# Patient Record
Sex: Male | Born: 1935 | Race: White | Hispanic: No | Smoking: Former smoker
Health system: Southern US, Community
[De-identification: ages and names within clinical notes are randomized; demographics above are authoritative.]

## PROBLEM LIST (undated history)

## (undated) DIAGNOSIS — G629 Polyneuropathy, unspecified: Secondary | ICD-10-CM

## (undated) DIAGNOSIS — B029 Zoster without complications: Secondary | ICD-10-CM

## (undated) DIAGNOSIS — I10 Essential (primary) hypertension: Secondary | ICD-10-CM

## (undated) DIAGNOSIS — IMO0002 Reserved for concepts with insufficient information to code with codable children: Secondary | ICD-10-CM

## (undated) DIAGNOSIS — I219 Acute myocardial infarction, unspecified: Secondary | ICD-10-CM

## (undated) DIAGNOSIS — I519 Heart disease, unspecified: Secondary | ICD-10-CM

## (undated) DIAGNOSIS — I639 Cerebral infarction, unspecified: Secondary | ICD-10-CM

## (undated) HISTORY — DX: Reserved for concepts with insufficient information to code with codable children: IMO0002

## (undated) HISTORY — DX: Heart disease, unspecified: I51.9

## (undated) HISTORY — DX: Cerebral infarction, unspecified: I63.9

## (undated) HISTORY — PX: OTHER SURGICAL HISTORY: SHX169

---

## 1976-09-30 DIAGNOSIS — I219 Acute myocardial infarction, unspecified: Secondary | ICD-10-CM

## 1976-09-30 HISTORY — DX: Acute myocardial infarction, unspecified: I21.9

## 2003-12-02 ENCOUNTER — Other Ambulatory Visit: Payer: Self-pay

## 2004-06-30 ENCOUNTER — Encounter: Payer: Self-pay | Admitting: Internal Medicine

## 2004-07-31 ENCOUNTER — Encounter: Payer: Self-pay | Admitting: Internal Medicine

## 2004-08-30 ENCOUNTER — Encounter: Payer: Self-pay | Admitting: Internal Medicine

## 2004-09-30 ENCOUNTER — Encounter: Payer: Self-pay | Admitting: Internal Medicine

## 2004-10-31 ENCOUNTER — Encounter: Payer: Self-pay | Admitting: Internal Medicine

## 2004-11-28 ENCOUNTER — Encounter: Payer: Self-pay | Admitting: Internal Medicine

## 2004-12-29 ENCOUNTER — Encounter: Payer: Self-pay | Admitting: Internal Medicine

## 2005-01-28 ENCOUNTER — Encounter: Payer: Self-pay | Admitting: Internal Medicine

## 2005-02-28 ENCOUNTER — Encounter: Payer: Self-pay | Admitting: Internal Medicine

## 2005-03-30 ENCOUNTER — Encounter: Payer: Self-pay | Admitting: Internal Medicine

## 2005-04-30 ENCOUNTER — Encounter: Payer: Self-pay | Admitting: Internal Medicine

## 2005-05-31 ENCOUNTER — Encounter: Payer: Self-pay | Admitting: Internal Medicine

## 2005-06-30 ENCOUNTER — Encounter: Payer: Self-pay | Admitting: Internal Medicine

## 2005-07-31 ENCOUNTER — Encounter: Payer: Self-pay | Admitting: Internal Medicine

## 2005-08-30 ENCOUNTER — Encounter: Payer: Self-pay | Admitting: Internal Medicine

## 2005-09-30 ENCOUNTER — Encounter: Payer: Self-pay | Admitting: Internal Medicine

## 2006-07-28 ENCOUNTER — Ambulatory Visit: Payer: Self-pay | Admitting: Internal Medicine

## 2006-08-18 ENCOUNTER — Ambulatory Visit: Payer: Self-pay | Admitting: Pain Medicine

## 2006-08-19 ENCOUNTER — Ambulatory Visit: Payer: Self-pay | Admitting: Pain Medicine

## 2006-09-02 ENCOUNTER — Ambulatory Visit: Payer: Self-pay | Admitting: Physician Assistant

## 2008-04-19 ENCOUNTER — Ambulatory Visit: Payer: Self-pay | Admitting: Unknown Physician Specialty

## 2008-06-06 ENCOUNTER — Ambulatory Visit: Payer: Self-pay | Admitting: Internal Medicine

## 2008-07-08 ENCOUNTER — Other Ambulatory Visit: Payer: Self-pay

## 2008-07-08 ENCOUNTER — Inpatient Hospital Stay: Payer: Self-pay | Admitting: Internal Medicine

## 2008-09-20 ENCOUNTER — Ambulatory Visit: Payer: Self-pay | Admitting: Gastroenterology

## 2010-02-28 ENCOUNTER — Ambulatory Visit: Payer: Self-pay | Admitting: Ophthalmology

## 2010-03-13 ENCOUNTER — Ambulatory Visit: Payer: Self-pay | Admitting: Ophthalmology

## 2010-04-03 ENCOUNTER — Ambulatory Visit: Payer: Self-pay | Admitting: Ophthalmology

## 2010-04-11 ENCOUNTER — Ambulatory Visit: Payer: Self-pay | Admitting: Ophthalmology

## 2010-09-05 DIAGNOSIS — C4492 Squamous cell carcinoma of skin, unspecified: Secondary | ICD-10-CM

## 2010-09-05 HISTORY — DX: Squamous cell carcinoma of skin, unspecified: C44.92

## 2012-09-15 ENCOUNTER — Ambulatory Visit: Payer: Self-pay | Admitting: Pain Medicine

## 2012-10-14 ENCOUNTER — Ambulatory Visit: Payer: Self-pay | Admitting: Pain Medicine

## 2012-10-15 ENCOUNTER — Ambulatory Visit: Payer: Self-pay | Admitting: Pain Medicine

## 2012-11-02 ENCOUNTER — Ambulatory Visit: Payer: Self-pay | Admitting: Pain Medicine

## 2013-08-18 ENCOUNTER — Encounter: Payer: Self-pay | Admitting: Podiatry

## 2013-08-23 ENCOUNTER — Ambulatory Visit (INDEPENDENT_AMBULATORY_CARE_PROVIDER_SITE_OTHER): Payer: Medicare Other | Admitting: Podiatry

## 2013-08-23 ENCOUNTER — Encounter: Payer: Self-pay | Admitting: Podiatry

## 2013-08-23 VITALS — BP 107/78 | HR 92 | Resp 16 | Ht 68.0 in | Wt 155.0 lb

## 2013-08-23 DIAGNOSIS — B351 Tinea unguium: Secondary | ICD-10-CM

## 2013-08-23 DIAGNOSIS — M79609 Pain in unspecified limb: Secondary | ICD-10-CM

## 2013-08-23 NOTE — Progress Notes (Signed)
Boen presents today with a chief complaint of painful toenails one through 5 bilateral.  Objective: Pulses are palpable bilateral. Nails are thick yellow dystrophic onychomycotic and painful on palpation and debridement.  Assessment: Pain in limb secondary to onychomycosis.  Plan: Debridement of nails 1 through 5 bilateral is cover service secondary to pain followup with him in 3 months.

## 2013-11-22 ENCOUNTER — Ambulatory Visit (INDEPENDENT_AMBULATORY_CARE_PROVIDER_SITE_OTHER): Payer: Medicare Other | Admitting: Podiatry

## 2013-11-22 ENCOUNTER — Ambulatory Visit: Payer: Medicare Other | Admitting: Podiatry

## 2013-11-22 ENCOUNTER — Encounter: Payer: Self-pay | Admitting: Podiatry

## 2013-11-22 VITALS — BP 125/68 | HR 61 | Resp 18

## 2013-11-22 DIAGNOSIS — B351 Tinea unguium: Secondary | ICD-10-CM

## 2013-11-22 DIAGNOSIS — M79609 Pain in unspecified limb: Secondary | ICD-10-CM

## 2013-11-22 NOTE — Progress Notes (Signed)
Trim toenails because they're painful when I walk.  Objective: Vital signs are stable alert and oriented x3. Nails are thick yellow dystrophic with mycotic and painful palpation. Pulses are palpable bilateral.  Assessment: Pain in limb secondary to onychomycosis 1 through 5 bilateral.  Plan: Debridement of nails in thickness and length as cover service secondary to pain followup with him in 3 months.

## 2014-02-14 ENCOUNTER — Ambulatory Visit (INDEPENDENT_AMBULATORY_CARE_PROVIDER_SITE_OTHER): Payer: Medicare Other | Admitting: Podiatry

## 2014-02-14 ENCOUNTER — Encounter: Payer: Self-pay | Admitting: Podiatry

## 2014-02-14 VITALS — BP 122/66 | HR 82 | Resp 16

## 2014-02-14 DIAGNOSIS — M79609 Pain in unspecified limb: Secondary | ICD-10-CM

## 2014-02-14 DIAGNOSIS — B351 Tinea unguium: Secondary | ICD-10-CM

## 2014-02-14 NOTE — Progress Notes (Signed)
He presents today chief complaint of painful toenails one through 5 bilateral.  Objective: Nails are thick yellow dystrophic with mycotic painful palpation  Assessment: Pain in limb secondary to onychomycosis 1 through 5 bilateral.  Plan: Debridement of nails 1 through 5 bilateral covered service secondary to pain.

## 2014-03-28 DIAGNOSIS — I739 Peripheral vascular disease, unspecified: Secondary | ICD-10-CM | POA: Insufficient documentation

## 2014-03-28 DIAGNOSIS — I10 Essential (primary) hypertension: Secondary | ICD-10-CM | POA: Insufficient documentation

## 2014-03-28 DIAGNOSIS — I639 Cerebral infarction, unspecified: Secondary | ICD-10-CM | POA: Insufficient documentation

## 2014-03-28 DIAGNOSIS — I2510 Atherosclerotic heart disease of native coronary artery without angina pectoris: Secondary | ICD-10-CM | POA: Insufficient documentation

## 2014-03-28 DIAGNOSIS — D510 Vitamin B12 deficiency anemia due to intrinsic factor deficiency: Secondary | ICD-10-CM | POA: Insufficient documentation

## 2014-03-28 DIAGNOSIS — E785 Hyperlipidemia, unspecified: Secondary | ICD-10-CM | POA: Insufficient documentation

## 2014-05-16 ENCOUNTER — Encounter: Payer: Self-pay | Admitting: Podiatry

## 2014-05-16 ENCOUNTER — Ambulatory Visit: Payer: Medicare Other | Admitting: Podiatry

## 2014-05-16 ENCOUNTER — Ambulatory Visit (INDEPENDENT_AMBULATORY_CARE_PROVIDER_SITE_OTHER): Payer: Medicare Other | Admitting: Podiatry

## 2014-05-16 DIAGNOSIS — M79676 Pain in unspecified toe(s): Secondary | ICD-10-CM

## 2014-05-16 DIAGNOSIS — B351 Tinea unguium: Secondary | ICD-10-CM

## 2014-05-16 DIAGNOSIS — M79609 Pain in unspecified limb: Secondary | ICD-10-CM

## 2014-05-16 NOTE — Progress Notes (Signed)
He presents today with a chief complaint of painful elongated toenails one through 5 bilateral.  Objective: Nails are thick yellow dystrophic onychomycotic and painful palpation.  Assessment: Pain in limb secondary to onychomycosis 1 through 5 bilateral.  Plan: Debridement of nails 1 through 5 bilateral covered service secondary to pain. 

## 2014-08-17 ENCOUNTER — Ambulatory Visit (INDEPENDENT_AMBULATORY_CARE_PROVIDER_SITE_OTHER): Payer: Medicare Other | Admitting: Podiatry

## 2014-08-17 DIAGNOSIS — M79676 Pain in unspecified toe(s): Secondary | ICD-10-CM

## 2014-08-17 DIAGNOSIS — B351 Tinea unguium: Secondary | ICD-10-CM

## 2014-08-17 NOTE — Progress Notes (Signed)
Presents today chief complaint of painful elongated toenails.  Objective: Pulses are palpable bilateral nails are thick, yellow dystrophic onychomycosis and painful palpation.   Assessment: Onychomycosis with pain in limb.  Plan: Treatment of nails in thickness and length as covered service secondary to pain.  

## 2014-09-19 DIAGNOSIS — Z8673 Personal history of transient ischemic attack (TIA), and cerebral infarction without residual deficits: Secondary | ICD-10-CM | POA: Insufficient documentation

## 2014-11-23 ENCOUNTER — Ambulatory Visit (INDEPENDENT_AMBULATORY_CARE_PROVIDER_SITE_OTHER): Payer: Medicare Other | Admitting: Podiatry

## 2014-11-23 DIAGNOSIS — B351 Tinea unguium: Secondary | ICD-10-CM

## 2014-11-23 DIAGNOSIS — M79676 Pain in unspecified toe(s): Secondary | ICD-10-CM

## 2014-11-23 NOTE — Progress Notes (Signed)
He presents today with chief complaint of painful elongated toenails.  Objective: Pulses are palpable bilateral. No open lesions. Nails are thick yellow dystrophic onychomycotic painful palpation 1 through 5 bilateral.  Assessment: Pain and limp secondary to onychomycosis 1 through 5 bilateral.  Plan: Debridement of nails 1 through 5 bilateral covers are secondary to pain.

## 2015-03-06 ENCOUNTER — Ambulatory Visit (INDEPENDENT_AMBULATORY_CARE_PROVIDER_SITE_OTHER): Payer: Medicare Other | Admitting: Podiatry

## 2015-03-06 DIAGNOSIS — M79676 Pain in unspecified toe(s): Secondary | ICD-10-CM | POA: Diagnosis not present

## 2015-03-06 DIAGNOSIS — B351 Tinea unguium: Secondary | ICD-10-CM

## 2015-03-06 NOTE — Progress Notes (Signed)
He presents today with chief complaint of painful elongated toenails.  Objective: Pulses are palpable bilateral. No open lesions. Nails are thick yellow dystrophic onychomycotic painful palpation 1 through 5 bilateral.  Assessment: Pain and limp secondary to onychomycosis 1 through 5 bilateral.  Plan: Debridement of nails 1 through 5 bilateral covers are secondary to pain.

## 2015-06-06 ENCOUNTER — Ambulatory Visit: Payer: Medicare Other

## 2015-06-19 ENCOUNTER — Ambulatory Visit (INDEPENDENT_AMBULATORY_CARE_PROVIDER_SITE_OTHER): Payer: Medicare Other | Admitting: Podiatry

## 2015-06-19 DIAGNOSIS — M79676 Pain in unspecified toe(s): Secondary | ICD-10-CM

## 2015-06-19 DIAGNOSIS — B351 Tinea unguium: Secondary | ICD-10-CM | POA: Diagnosis not present

## 2015-06-19 NOTE — Progress Notes (Signed)
He presents today with chief complaint of painful elongated toenails.  Objective: Pulses are palpable bilateral. No open lesions. Nails are thick yellow dystrophic onychomycotic painful palpation 1 through 5 bilateral.  Assessment: Pain and limp secondary to onychomycosis 1 through 5 bilateral.  Plan: Debridement of nails 1 through 5 bilateral covers are secondary to pain.  Roselind Messier DPM

## 2015-09-12 ENCOUNTER — Ambulatory Visit: Payer: Medicare Other | Attending: Internal Medicine

## 2015-09-12 DIAGNOSIS — G4733 Obstructive sleep apnea (adult) (pediatric): Secondary | ICD-10-CM | POA: Diagnosis present

## 2015-09-18 ENCOUNTER — Encounter: Payer: Self-pay | Admitting: Podiatry

## 2015-09-18 ENCOUNTER — Ambulatory Visit: Payer: TRICARE For Life (TFL)

## 2015-09-18 ENCOUNTER — Ambulatory Visit (INDEPENDENT_AMBULATORY_CARE_PROVIDER_SITE_OTHER): Payer: Medicare Other | Admitting: Podiatry

## 2015-09-18 DIAGNOSIS — M79676 Pain in unspecified toe(s): Secondary | ICD-10-CM

## 2015-09-18 DIAGNOSIS — B351 Tinea unguium: Secondary | ICD-10-CM

## 2015-09-18 NOTE — Progress Notes (Signed)
Darryl Novak presents with cc of painful toenails bilateral foot.  Sharpe ingrowns.  Objective: pulses palp bilaterally. Nails thick yellow dystrophic and painful on debridement with sharpe incurvated margins  Assessment:  Pain in limb secondary to onychomycosis.  Plan:  debridedement of nails toes one through five bilateral.  Follow up in three months.  Roselind Messier, [place].

## 2015-10-03 ENCOUNTER — Ambulatory Visit: Payer: Medicare Other | Attending: Internal Medicine

## 2015-10-03 DIAGNOSIS — G4733 Obstructive sleep apnea (adult) (pediatric): Secondary | ICD-10-CM | POA: Insufficient documentation

## 2015-10-17 DIAGNOSIS — G4733 Obstructive sleep apnea (adult) (pediatric): Secondary | ICD-10-CM | POA: Insufficient documentation

## 2015-12-18 ENCOUNTER — Ambulatory Visit (INDEPENDENT_AMBULATORY_CARE_PROVIDER_SITE_OTHER): Payer: Medicare Other | Admitting: Podiatry

## 2015-12-18 DIAGNOSIS — M79676 Pain in unspecified toe(s): Secondary | ICD-10-CM

## 2015-12-18 DIAGNOSIS — B351 Tinea unguium: Secondary | ICD-10-CM

## 2015-12-18 NOTE — Progress Notes (Signed)
He presents today with a chief complaint of painful elongated toenails.  Objective: Vital signs stable alert and oriented 3. Pulses are strongly palpable. No calf pain. No open lesions or holes.  Assessment: Pain and limp secondary to onychomycosis 1 through 5 bilateral.  Plan: Debridement of toenails 1 through 5 bilateral covered service secondary to pain.

## 2016-03-25 ENCOUNTER — Ambulatory Visit (INDEPENDENT_AMBULATORY_CARE_PROVIDER_SITE_OTHER): Payer: Medicare Other | Admitting: Podiatry

## 2016-03-25 ENCOUNTER — Encounter: Payer: Self-pay | Admitting: Podiatry

## 2016-03-25 DIAGNOSIS — M79676 Pain in unspecified toe(s): Secondary | ICD-10-CM

## 2016-03-25 DIAGNOSIS — B351 Tinea unguium: Secondary | ICD-10-CM

## 2016-03-25 NOTE — Progress Notes (Signed)
He presents today with chief complaint of painful elongated toenails.  Objective: Vital signs are stable he is alert and oriented 3. Pulses are palpable. Neurologic sensorium is intact. Deep tendon reflexes are intact. Toenails are thick yellow dystrophic with mycotic and painful palpation.  Assessment: Pain and limp secondary to onychomycosis 1 through 5 bilateral.  Plan: Debridement of toenails 1 through 5 bilateral carotid service secondary to pain. Follow up with him in 3 months.

## 2016-04-10 DIAGNOSIS — I482 Chronic atrial fibrillation, unspecified: Secondary | ICD-10-CM | POA: Insufficient documentation

## 2016-06-03 ENCOUNTER — Emergency Department: Payer: Medicare Other

## 2016-06-03 ENCOUNTER — Emergency Department
Admission: EM | Admit: 2016-06-03 | Discharge: 2016-06-03 | Disposition: A | Discharge status: Home / Self Care | Payer: Medicare Other | Attending: Emergency Medicine | Admitting: Emergency Medicine

## 2016-06-03 DIAGNOSIS — I2510 Atherosclerotic heart disease of native coronary artery without angina pectoris: Secondary | ICD-10-CM | POA: Diagnosis not present

## 2016-06-03 DIAGNOSIS — Z87891 Personal history of nicotine dependence: Secondary | ICD-10-CM | POA: Diagnosis not present

## 2016-06-03 DIAGNOSIS — I10 Essential (primary) hypertension: Secondary | ICD-10-CM | POA: Insufficient documentation

## 2016-06-03 DIAGNOSIS — R531 Weakness: Secondary | ICD-10-CM | POA: Diagnosis not present

## 2016-06-03 HISTORY — DX: Essential (primary) hypertension: I10

## 2016-06-03 HISTORY — DX: Polyneuropathy, unspecified: G62.9

## 2016-06-03 LAB — COMPREHENSIVE METABOLIC PANEL
ALK PHOS: 59 U/L (ref 38–126)
ALT: 18 U/L (ref 17–63)
ANION GAP: 10 (ref 5–15)
AST: 35 U/L (ref 15–41)
Albumin: 3.8 g/dL (ref 3.5–5.0)
BILIRUBIN TOTAL: 0.7 mg/dL (ref 0.3–1.2)
BUN: 14 mg/dL (ref 6–20)
CALCIUM: 8.7 mg/dL — AB (ref 8.9–10.3)
CO2: 21 mmol/L — ABNORMAL LOW (ref 22–32)
Chloride: 102 mmol/L (ref 101–111)
Creatinine, Ser: 1 mg/dL (ref 0.61–1.24)
GFR calc non Af Amer: >60 mL/min (ref >60)
Glucose, Bld: 124 mg/dL — ABNORMAL HIGH (ref 65–99)
Potassium: 3.6 mmol/L (ref 3.5–5.1)
Sodium: 133 mmol/L — ABNORMAL LOW (ref 135–145)
TOTAL PROTEIN: 6.8 g/dL (ref 6.5–8.1)

## 2016-06-03 LAB — CBC WITH DIFFERENTIAL/PLATELET
Basophils Absolute: 0.1 10*3/uL (ref 0–0.1)
Basophils Relative: 1 %
Eosinophils Absolute: 0.2 10*3/uL (ref 0–0.7)
Eosinophils Relative: 4 %
HEMATOCRIT: 38.7 % — AB (ref 40.0–52.0)
HEMOGLOBIN: 13.3 g/dL (ref 13.0–18.0)
LYMPHS ABS: 1.2 10*3/uL (ref 1.0–3.6)
LYMPHS PCT: 21 %
MCH: 34.6 pg — AB (ref 26.0–34.0)
MCHC: 34.4 g/dL (ref 32.0–36.0)
MCV: 100.6 fL — AB (ref 80.0–100.0)
MONOS PCT: 11 %
Monocytes Absolute: 0.7 10*3/uL (ref 0.2–1.0)
NEUTROS ABS: 3.7 10*3/uL (ref 1.4–6.5)
NEUTROS PCT: 63 %
Platelets: 120 10*3/uL — ABNORMAL LOW (ref 150–440)
RBC: 3.85 MIL/uL — ABNORMAL LOW (ref 4.40–5.90)
RDW: 13.4 % (ref 11.5–14.5)
WBC: 5.9 10*3/uL (ref 3.8–10.6)

## 2016-06-03 LAB — URINALYSIS COMPLETE WITH MICROSCOPIC (ARMC ONLY)
BILIRUBIN URINE: NEGATIVE
Bacteria, UA: NONE SEEN
Glucose, UA: NEGATIVE mg/dL
Hgb urine dipstick: NEGATIVE
KETONES UR: NEGATIVE mg/dL
Leukocytes, UA: NEGATIVE
Nitrite: NEGATIVE
PROTEIN: 30 mg/dL — AB
SQUAMOUS EPITHELIAL / LPF: NONE SEEN
Specific Gravity, Urine: 1.016 (ref 1.005–1.030)
pH: 6 (ref 5.0–8.0)

## 2016-06-03 LAB — LIPASE, BLOOD: Lipase: 39 U/L (ref 11–51)

## 2016-06-03 LAB — TROPONIN I

## 2016-06-03 MED ORDER — SODIUM CHLORIDE 0.9 % IV BOLUS (SEPSIS)
1000.0000 mL | Freq: Once | INTRAVENOUS | Status: AC
  Administered 2016-06-03: 1000 mL via INTRAVENOUS

## 2016-06-03 NOTE — ED Provider Notes (Signed)
[facility] Emergency Department Provider Note   ____________________________________________   First MD Initiated Contact with Patient 06/03/16 1900     (approximate)  I have reviewed the triage vital signs and the nursing notes.   HISTORY  Chief Complaint Weakness   HPI Darryl Novak is a 82 y.o. male with a history of hypertension and neuropathy as well as atrial fibrillation on eliquis who is presenting to the emergency department feeling weak after dinner tonight. He said that he was walking to his car when he felt weak all of a sudden. He said that he had felt his legs start to give out on him and he thought that the weakness was localized below his knees bilaterally. He says that he is feeling better at this time but not complete back to normal. He said that he also had a mild amount of numbness to his bilateral extremities during this episode. He is denying any pain at this time. Said that he did not feel lightheaded or dizzy. Denies any shortness of breath or cough. Denies any burning with urination. He says that he has a history of peripheral neuropathy. Patient denies falling or hitting his head during the episode. Patient denies any recent viral illnesses such as diarrhea or shortness of breath or cough. He denies any recent immunizations.   Past Medical History:  Diagnosis Date  . Heart trouble   . Hypertension   . Neuropathy ([place])   . Stroke ([place])   . Ulcer     Patient Active Problem List   Diagnosis Date Noted  . Cerebrovascular accident, old 09/19/2014  . Arteriosclerosis of coronary artery 03/28/2014  . HLD (hyperlipidemia) 03/28/2014  . BP (high blood pressure) 03/28/2014  . Addison anemia 03/28/2014  . Peripheral vascular disease ([place]) 03/28/2014  . Cerebrovascular accident (CVA) ([place]) 03/28/2014    History reviewed. No pertinent surgical history.  Prior to Admission medications   Medication Sig Start Date End Date Taking?  Authorizing Provider  amLODipine (NORVASC) 5 MG tablet Take 5 mg by mouth daily.    Historical Provider, MD  apixaban (ELIQUIS) 5 MG TABS tablet  09/11/15   Historical Provider, MD  Cholecalciferol (VITAMIN D PO) Take by mouth.    Historical Provider, MD  Coenzyme Q10 (CO Q-10) 100 MG CAPS Take by mouth.    Historical Provider, MD  cyanocobalamin (,VITAMIN B-12,) 1000 MCG/ML injection Inject into the muscle. 02/08/14   Historical Provider, MD  furosemide (LASIX) 20 MG tablet Take by mouth. 07/19/15 07/18/16  Historical Provider, MD  gabapentin (NEURONTIN) 600 MG tablet  03/27/15   Historical Provider, MD  isosorbide mononitrate (IMDUR) 30 MG 24 hr tablet Take by mouth. 09/16/14   Historical Provider, MD  potassium chloride (KLOR-CON M10) 10 MEQ tablet  02/20/15   Historical Provider, MD  simvastatin (ZOCOR) 20 MG tablet Take 20 mg by mouth daily.    Historical Provider, MD  telmisartan (MICARDIS) 40 MG tablet Take 40 mg by mouth daily.    Historical Provider, MD    Allergies Ace inhibitors  No family history on file.  Social History Social History  Substance Use Topics  . Smoking status: Former Smoker    Types: Cigarettes  . Smokeless tobacco: Never Used     Comment: quit 45 years ago  . Alcohol use Yes    Review of Systems Constitutional: No fever/chills Eyes: No visual changes. ENT: No sore throat. Cardiovascular: Denies chest pain. Respiratory: Denies shortness of breath. Gastrointestinal: No abdominal  pain.  No nausea, no vomiting.  No diarrhea.  No constipation. Genitourinary: Negative for dysuria. Musculoskeletal: Negative for back pain. Skin: Negative for rash. Neurological: Negative for headaches, focal weakness or numbness.  10-point ROS otherwise negative.  ____________________________________________   PHYSICAL EXAM:  VITAL SIGNS: ED Triage Vitals  Enc Vitals Group     BP 06/03/16 1821 (!) 84/54     Pulse Rate 06/03/16 1821 81     Resp 06/03/16 1821 (!) 22      Temp 06/03/16 1821 98.5 F (36.9 C)     Temp Source 06/03/16 1821 Oral     SpO2 06/03/16 1821 93 %     Weight 06/03/16 1822 163 lb (73.9 kg)     Height 06/03/16 1822 5\' 8"  (1.727 m)     Head Circumference --      Peak Flow --      Pain Score 06/03/16 1842 0     Pain Loc --      Pain Edu? --      Excl. in [place]? --     Constitutional: Alert and oriented. Well appearing and in no acute distress. Eyes: Conjunctivae are normal. PERRL. EOMI. Head: Atraumatic. Nose: No congestion/rhinnorhea. Mouth/Throat: Mucous membranes are moist.   Neck: No stridor.   Cardiovascular: Normal rate, regular rhythm. Grossly normal heart sounds.   Respiratory: Normal respiratory effort.  No retractions. Lungs CTAB. Oxygen saturation is 96% on room air when I'm in the room. Gastrointestinal: Soft and nontender. No distention. No abdominal bruits. No CVA tenderness. Musculoskeletal: No lower extremity tenderness nor edema.  No joint effusions. Neurologic:  Normal speech and language. No gross focal neurologic deficits are appreciated. 55 strength throughout including to the bilateral lower extremities. No sensory deficits. Skin:  Skin is warm, dry and intact. No rash noted. Psychiatric: Mood and affect are normal. Speech and behavior are normal.  ____________________________________________   LABS (all labs ordered are listed, but only abnormal results are displayed)  Labs Reviewed  CBC WITH DIFFERENTIAL/PLATELET - Abnormal; Notable for the following:       Result Value   RBC 3.85 (*)    HCT 38.7 (*)    MCV 100.6 (*)    MCH 34.6 (*)    Platelets 120 (*)    All other components within normal limits  COMPREHENSIVE METABOLIC PANEL - Abnormal; Notable for the following:    Sodium 133 (*)    CO2 21 (*)    Glucose, Bld 124 (*)    Calcium 8.7 (*)    All other components within normal limits  URINALYSIS COMPLETEWITH MICROSCOPIC ([facility] ONLY) - Abnormal; Notable for the following:    Color, Urine  YELLOW (*)    APPearance CLEAR (*)    Protein, ur 30 (*)    All other components within normal limits  LIPASE, BLOOD  TROPONIN I   ____________________________________________  EKG  ED ECG REPORT I, Doran Stabler, the attending physician, personally viewed and interpreted this ECG.   Date: 06/03/2016  EKG Time: 1926  Rate: 87  Rhythm: atrial fibrillation, rate 87  Axis: Normal axis  Intervals:none  ST&T Change: No ST segment elevation or depression. No abnormal T-wave inversion.  ____________________________________________  RADIOLOGY  DG Chest 2 View (Accession [id number]) (Order [id number])  Imaging  Date: 06/03/2016 Department: [facility] EMERGENCY DEPARTMENT Released By/Authorizing: Orbie Pyo, MD (auto-released)  PACS Images   Show images for DG Chest 2 View  Study Result   CLINICAL DATA:  82 year old male with weakness and shortness of breath. Cough. Initial encounter.  EXAM: CHEST  2 VIEW  COMPARISON:  Portable chest 07/08/2008.  FINDINGS: Seated AP and lateral views of the chest. Mild to moderate cardiomegaly appears increased since 2009. Calcified aortic atherosclerosis. Other mediastinal contours are within normal limits. Visualized tracheal air column is within normal limits. No pneumothorax or pulmonary edema. No pleural effusion or confluent pulmonary opacity. Osteopenia. No acute osseous abnormality identified.  IMPRESSION: 1. Mild to moderate cardiomegaly appears progressed since 2009. 2.  No acute cardiopulmonary abnormality. 3.  Calcified aortic atherosclerosis.   Electronically Signed   By: Genevie Ann M.D.   On: 06/03/2016 21:27     _________________________________________   PROCEDURES  Procedure(s) performed:   Procedures  Critical Care performed:   ____________________________________________   INITIAL IMPRESSION / ASSESSMENT AND PLAN / ED COURSE  Pertinent labs & imaging  results that were available during my care of the patient were reviewed by me and considered in my medical decision making (see chart for details).  ----------------------------------------- 9:34 PM on 06/03/2016 -----------------------------------------  Patient at this time says that he is feeling much better after fluids. Very reassuring workup. The patient did say that he had a cough over the past several days. However, he denies any fever. Reassuring chest x-ray. No infectious source found on the chest x-ray nor the urine.  Patient was able to ambulate without assistance. He did have a mild shuffling to his gait for the first several steps but then progressed to a normal gait unassisted. During the reevaluation the wife pulled me out of the room and told me that she is concerned about the patient's drinking. She says that he goes to the gym and then disappears for several hours in the afternoons. She says that he has been vague about his whereabouts. She says that he says that he will drink 2 drinks per day but she is concerned that he is drinking more because she thinks that the difficulties with ambulating it worse, as the day goes on. The patient appears clinically sober at this time and appears to be feeling improved. He'll be discharged home. In private, I did discuss with the wife and she says that she may try to keep a closer eye on him in terms of his drinking as well as try and see if he is amenable to using a walker.  I also discussed with the wife and she felt that he was safe to go home and she says yes. The patient also would like to go home. On my exam, he did not have any objective neurological findings of bilateral lower extremity is. Initially when he had his first complaint of distal lower summary weakness with suspected possible Guillain-Barr. However, there are multiple other confounding factors especially that he is feeling better and that this may have been alcohol-induced and  also that his blood pressure was low and that he feels better after fluids and his pressure is improved.  Clinical Course     ____________________________________________   FINAL CLINICAL IMPRESSION(S) / ED DIAGNOSES  Weakness.    NEW MEDICATIONS STARTED DURING THIS VISIT:  New Prescriptions   No medications on file     Note:  This document was prepared using Dragon voice recognition software and may include unintentional dictation errors.    Orbie Pyo, MD 06/03/16 [phone]

## 2016-06-03 NOTE — ED Notes (Signed)
Pt to ED for bilat lower extremeties, pt was unable to get out of car for wife. Pt hx of a-fib, hypotensive in triage. Pt BP stable at this time. Pt denies any CP or SOB. Pt c/o weakness . Pt A&Ox4

## 2016-06-03 NOTE — ED Notes (Signed)
Patient transported to X-ray 

## 2016-06-03 NOTE — ED Triage Notes (Signed)
Pt states he went to the gym today and then to eat with his wife and when he was trying to get into the car his legs gave out causing him to fall, denies any injuries..states he is just weak, and SOB.Marland Kitchen

## 2016-06-03 NOTE — ED Notes (Signed)
Pt ambulated through room before d/c. Pt insist he is fine to walk when he gets into the house. Wife at bedside

## 2016-06-18 ENCOUNTER — Encounter: Payer: Self-pay | Admitting: Podiatry

## 2016-06-19 ENCOUNTER — Ambulatory Visit (INDEPENDENT_AMBULATORY_CARE_PROVIDER_SITE_OTHER): Payer: Medicare Other | Admitting: Podiatry

## 2016-06-19 DIAGNOSIS — M79676 Pain in unspecified toe(s): Secondary | ICD-10-CM | POA: Diagnosis not present

## 2016-06-19 DIAGNOSIS — B351 Tinea unguium: Secondary | ICD-10-CM

## 2016-06-19 NOTE — Progress Notes (Signed)
He presents today with a chief complaint of painful elongated toenails 1 through 5 bilateral. Toenails are thick yellow dystrophic with mycotic painful palpation.  Assessment: Pain and limp secondary onychomycosis.  Plan: Debridement of toenails 1 through 5 bilateral.

## 2016-09-18 ENCOUNTER — Ambulatory Visit (INDEPENDENT_AMBULATORY_CARE_PROVIDER_SITE_OTHER): Payer: Medicare Other | Admitting: Podiatry

## 2016-09-18 ENCOUNTER — Encounter: Payer: Self-pay | Admitting: Podiatry

## 2016-09-18 DIAGNOSIS — M79676 Pain in unspecified toe(s): Secondary | ICD-10-CM | POA: Diagnosis not present

## 2016-09-18 DIAGNOSIS — B351 Tinea unguium: Secondary | ICD-10-CM | POA: Diagnosis not present

## 2016-09-19 NOTE — Progress Notes (Signed)
He presents today with chief complaint painful elongated toenails.  Objective: Vital signs are stable he's alert and oriented 3. Pulses are palpable. No erythema edema cellulitis drainage or odor.  Assessment: Pain in limb secondary to onychomycosis.  Plan: Debridement of toenails 1 through 5 bilateral.

## 2016-12-18 ENCOUNTER — Ambulatory Visit (INDEPENDENT_AMBULATORY_CARE_PROVIDER_SITE_OTHER): Payer: Medicare Other | Admitting: Podiatry

## 2016-12-18 DIAGNOSIS — M79676 Pain in unspecified toe(s): Secondary | ICD-10-CM

## 2016-12-18 DIAGNOSIS — B351 Tinea unguium: Secondary | ICD-10-CM | POA: Diagnosis not present

## 2016-12-18 NOTE — Progress Notes (Signed)
He presents today with chief complaint of painful elongated toenails.  Objective: Vital signs are stable he's alert and oriented 3. Pulses are palpable. Neurologic sensorium is intact. Degenerative flexor tendon. It is along the talar dystrophic with mycotic painful palpation area  Assessment: Patient was admitted onychomycosis.  Plan: Debridement of toenails 1 through 5 bilateral covered service secondary to pain. Follow up with me in 3 months. He is

## 2017-03-19 ENCOUNTER — Ambulatory Visit (INDEPENDENT_AMBULATORY_CARE_PROVIDER_SITE_OTHER): Payer: Medicare Other | Admitting: Podiatry

## 2017-03-19 ENCOUNTER — Encounter: Payer: Self-pay | Admitting: Podiatry

## 2017-03-19 DIAGNOSIS — M79676 Pain in unspecified toe(s): Secondary | ICD-10-CM

## 2017-03-19 DIAGNOSIS — B351 Tinea unguium: Secondary | ICD-10-CM

## 2017-03-19 NOTE — Progress Notes (Signed)
He presents today with a chief complaint of painful elongated toenails.  Objective: Toenails are long dystrophic, mycotic pulses are palpable. No open lesions or wounds are noted.  Assessment: Pain limb secondary to onychomycosis.  Plan: Debridement of toenails 1 through 5 bilateral without iatrogenic lesions.

## 2017-06-12 ENCOUNTER — Other Ambulatory Visit: Payer: Self-pay | Admitting: Physician Assistant

## 2017-06-12 DIAGNOSIS — M545 Low back pain, unspecified: Secondary | ICD-10-CM

## 2017-06-16 ENCOUNTER — Ambulatory Visit (INDEPENDENT_AMBULATORY_CARE_PROVIDER_SITE_OTHER): Payer: Medicare Other | Admitting: Podiatry

## 2017-06-16 ENCOUNTER — Encounter: Payer: Self-pay | Admitting: Podiatry

## 2017-06-16 DIAGNOSIS — M79676 Pain in unspecified toe(s): Secondary | ICD-10-CM

## 2017-06-16 DIAGNOSIS — B351 Tinea unguium: Secondary | ICD-10-CM

## 2017-06-16 NOTE — Progress Notes (Signed)
Complaint:  Visit Type: Patient returns to my office for continued preventative foot care services. Complaint: Patient states" my nails have grown long and thick and become painful to walk and wear shoes" . The patient presents for preventative foot care services. No changes to ROS.    Podiatric Exam: Vascular: dorsalis pedis and posterior tibial pulses are weakly  palpable bilateral. Capillary return is immediate. Temperature gradient is WNL. Skin turgor WNL  Sensorium: Normal Semmes Weinstein monofilament test. Normal tactile sensation bilaterally. Nail Exam: Pt has thick disfigured discolored nails with subungual debris noted bilateral entire nail hallux through fifth toenails Ulcer Exam: There is no evidence of ulcer or pre-ulcerative changes or infection. Orthopedic Exam: Muscle tone and strength are WNL. No limitations in general ROM. No crepitus or effusions noted. Foot type and digits show no abnormalities. Bony prominences are unremarkable. Skin: No Porokeratosis. No infection or ulcers  Diagnosis:  Onychomycosis, , Pain in right toe, pain in left toes  Treatment & Plan Procedures and Treatment: Consent by patient was obtained for treatment procedures.   Debridement of mycotic and hypertrophic toenails, 1 through 5 bilateral and clearing of subungual debris. No ulceration, no infection noted.  Return Visit-Office Procedure: Patient instructed to return to the office for a follow up visit 3 months for continued evaluation and treatment.    Rockland Kotarski DPM 

## 2017-06-20 ENCOUNTER — Ambulatory Visit
Admission: RE | Admit: 2017-06-20 | Discharge: 2017-06-20 | Disposition: A | Discharge status: Home / Self Care | Payer: Medicare Other | Source: Ambulatory Visit | Attending: Physician Assistant | Admitting: Physician Assistant

## 2017-06-20 DIAGNOSIS — M545 Low back pain, unspecified: Secondary | ICD-10-CM

## 2017-06-20 DIAGNOSIS — M48061 Spinal stenosis, lumbar region without neurogenic claudication: Secondary | ICD-10-CM | POA: Diagnosis not present

## 2017-06-20 DIAGNOSIS — M5441 Lumbago with sciatica, right side: Secondary | ICD-10-CM | POA: Diagnosis present

## 2017-06-20 DIAGNOSIS — M7138 Other bursal cyst, other site: Secondary | ICD-10-CM | POA: Diagnosis not present

## 2017-06-20 DIAGNOSIS — M5136 Other intervertebral disc degeneration, lumbar region: Secondary | ICD-10-CM | POA: Insufficient documentation

## 2017-09-15 ENCOUNTER — Encounter: Payer: Self-pay | Admitting: Podiatry

## 2017-09-15 ENCOUNTER — Ambulatory Visit (INDEPENDENT_AMBULATORY_CARE_PROVIDER_SITE_OTHER): Payer: Medicare Other | Admitting: Podiatry

## 2017-09-15 DIAGNOSIS — D689 Coagulation defect, unspecified: Secondary | ICD-10-CM

## 2017-09-15 DIAGNOSIS — B351 Tinea unguium: Secondary | ICD-10-CM | POA: Diagnosis not present

## 2017-09-15 DIAGNOSIS — M79676 Pain in unspecified toe(s): Secondary | ICD-10-CM

## 2017-09-15 NOTE — Progress Notes (Signed)
Complaint:  Visit Type: Patient returns to my office for continued preventative foot care services. Complaint: Patient states" my nails have grown long and thick and become painful to walk and wear shoes". The patient presents for preventative foot care services. No changes to ROS.  Patient is on eliquiss.  Podiatric Exam: Vascular: dorsalis pedis and posterior tibial pulses are weakly  palpable bilateral. Capillary return is immediate. Temperature gradient is WNL. Skin turgor WNL  Sensorium: Normal Semmes Weinstein monofilament test. Normal tactile sensation bilaterally. Nail Exam: Pt has thick disfigured discolored nails with subungual debris noted bilateral entire nail hallux through fifth toenails Ulcer Exam: There is no evidence of ulcer or pre-ulcerative changes or infection. Orthopedic Exam: Muscle tone and strength are WNL. No limitations in general ROM. No crepitus or effusions noted. Foot type and digits show no abnormalities. Bony prominences are unremarkable. Skin: No Porokeratosis. No infection or ulcers  Diagnosis:  Onychomycosis, , Pain in right toe, pain in left toes  Treatment & Plan Procedures and Treatment: Consent by patient was obtained for treatment procedures.   Debridement of mycotic and hypertrophic toenails, 1 through 5 bilateral and clearing of subungual debris. No ulceration, no infection noted.  Return Visit-Office Procedure: Patient instructed to return to the office for a follow up visit 3 months for continued evaluation and treatment.    Gardiner Barefoot DPM

## 2017-10-28 ENCOUNTER — Other Ambulatory Visit
Admission: RE | Admit: 2017-10-28 | Discharge: 2017-10-28 | Disposition: A | Discharge status: Home / Self Care | Payer: Medicare Other | Source: Ambulatory Visit | Attending: Ophthalmology | Admitting: Ophthalmology

## 2017-10-28 ENCOUNTER — Other Ambulatory Visit: Payer: Self-pay | Admitting: Ophthalmology

## 2017-10-28 DIAGNOSIS — H34232 Retinal artery branch occlusion, left eye: Secondary | ICD-10-CM | POA: Diagnosis present

## 2017-10-28 LAB — CBC WITH DIFFERENTIAL/PLATELET
BASOS ABS: 0.1 10*3/uL (ref 0–0.1)
Basophils Relative: 1 %
Eosinophils Absolute: 0.2 10*3/uL (ref 0–0.7)
Eosinophils Relative: 3 %
HEMATOCRIT: 44.5 % (ref 40.0–52.0)
HEMOGLOBIN: 15.2 g/dL (ref 13.0–18.0)
Lymphocytes Relative: 24 %
Lymphs Abs: 1.6 10*3/uL (ref 1.0–3.6)
MCH: 33.2 pg (ref 26.0–34.0)
MCHC: 34.1 g/dL (ref 32.0–36.0)
MCV: 97.5 fL (ref 80.0–100.0)
Monocytes Absolute: 0.9 10*3/uL (ref 0.2–1.0)
Monocytes Relative: 13 %
NEUTROS ABS: 4.1 10*3/uL (ref 1.4–6.5)
NEUTROS PCT: 59 %
Platelets: 158 10*3/uL (ref 150–440)
RBC: 4.56 MIL/uL (ref 4.40–5.90)
RDW: 12.9 % (ref 11.5–14.5)
WBC: 6.8 10*3/uL (ref 3.8–10.6)

## 2017-10-28 LAB — C-REACTIVE PROTEIN

## 2017-10-28 LAB — SEDIMENTATION RATE: Sed Rate: 4 mm/hr (ref 0–20)

## 2017-10-30 ENCOUNTER — Ambulatory Visit
Admission: RE | Admit: 2017-10-30 | Discharge: 2017-10-30 | Disposition: A | Discharge status: Home / Self Care | Payer: Medicare Other | Source: Ambulatory Visit | Attending: Ophthalmology | Admitting: Ophthalmology

## 2017-10-30 DIAGNOSIS — H34232 Retinal artery branch occlusion, left eye: Secondary | ICD-10-CM | POA: Diagnosis present

## 2017-11-07 DIAGNOSIS — H543 Unqualified visual loss, both eyes: Secondary | ICD-10-CM | POA: Insufficient documentation

## 2017-11-13 ENCOUNTER — Encounter: Payer: Self-pay | Admitting: Emergency Medicine

## 2017-11-13 ENCOUNTER — Inpatient Hospital Stay
Admission: EM | Admit: 2017-11-13 | Discharge: 2017-11-19 | DRG: 682 | Disposition: A | Discharge status: Skilled Nursing Facility | Payer: Medicare Other | Attending: Internal Medicine | Admitting: Internal Medicine

## 2017-11-13 ENCOUNTER — Other Ambulatory Visit: Payer: Self-pay

## 2017-11-13 DIAGNOSIS — I252 Old myocardial infarction: Secondary | ICD-10-CM

## 2017-11-13 DIAGNOSIS — I272 Pulmonary hypertension, unspecified: Secondary | ICD-10-CM | POA: Diagnosis present

## 2017-11-13 DIAGNOSIS — T380X5A Adverse effect of glucocorticoids and synthetic analogues, initial encounter: Secondary | ICD-10-CM | POA: Diagnosis present

## 2017-11-13 DIAGNOSIS — E785 Hyperlipidemia, unspecified: Secondary | ICD-10-CM | POA: Diagnosis present

## 2017-11-13 DIAGNOSIS — R339 Retention of urine, unspecified: Secondary | ICD-10-CM | POA: Diagnosis present

## 2017-11-13 DIAGNOSIS — Z87891 Personal history of nicotine dependence: Secondary | ICD-10-CM

## 2017-11-13 DIAGNOSIS — G629 Polyneuropathy, unspecified: Secondary | ICD-10-CM | POA: Diagnosis present

## 2017-11-13 DIAGNOSIS — E860 Dehydration: Secondary | ICD-10-CM | POA: Diagnosis present

## 2017-11-13 DIAGNOSIS — Z7901 Long term (current) use of anticoagulants: Secondary | ICD-10-CM | POA: Diagnosis not present

## 2017-11-13 DIAGNOSIS — B0232 Zoster iridocyclitis: Secondary | ICD-10-CM | POA: Diagnosis present

## 2017-11-13 DIAGNOSIS — R4182 Altered mental status, unspecified: Secondary | ICD-10-CM

## 2017-11-13 DIAGNOSIS — E871 Hypo-osmolality and hyponatremia: Secondary | ICD-10-CM | POA: Diagnosis present

## 2017-11-13 DIAGNOSIS — Z888 Allergy status to other drugs, medicaments and biological substances status: Secondary | ICD-10-CM | POA: Diagnosis not present

## 2017-11-13 DIAGNOSIS — I2510 Atherosclerotic heart disease of native coronary artery without angina pectoris: Secondary | ICD-10-CM | POA: Diagnosis present

## 2017-11-13 DIAGNOSIS — T375X5A Adverse effect of antiviral drugs, initial encounter: Secondary | ICD-10-CM | POA: Diagnosis present

## 2017-11-13 DIAGNOSIS — N179 Acute kidney failure, unspecified: Secondary | ICD-10-CM | POA: Diagnosis present

## 2017-11-13 DIAGNOSIS — Z8249 Family history of ischemic heart disease and other diseases of the circulatory system: Secondary | ICD-10-CM | POA: Diagnosis not present

## 2017-11-13 DIAGNOSIS — G9341 Metabolic encephalopathy: Secondary | ICD-10-CM | POA: Diagnosis present

## 2017-11-13 DIAGNOSIS — Z8673 Personal history of transient ischemic attack (TIA), and cerebral infarction without residual deficits: Secondary | ICD-10-CM | POA: Diagnosis not present

## 2017-11-13 DIAGNOSIS — I739 Peripheral vascular disease, unspecified: Secondary | ICD-10-CM | POA: Diagnosis present

## 2017-11-13 DIAGNOSIS — I4891 Unspecified atrial fibrillation: Secondary | ICD-10-CM | POA: Diagnosis present

## 2017-11-13 DIAGNOSIS — I10 Essential (primary) hypertension: Secondary | ICD-10-CM | POA: Diagnosis present

## 2017-11-13 DIAGNOSIS — G4733 Obstructive sleep apnea (adult) (pediatric): Secondary | ICD-10-CM | POA: Diagnosis present

## 2017-11-13 DIAGNOSIS — R443 Hallucinations, unspecified: Secondary | ICD-10-CM | POA: Diagnosis present

## 2017-11-13 HISTORY — DX: Zoster without complications: B02.9

## 2017-11-13 HISTORY — DX: Acute myocardial infarction, unspecified: I21.9

## 2017-11-13 LAB — COMPREHENSIVE METABOLIC PANEL
ALT: 19 U/L (ref 17–63)
ANION GAP: 11 (ref 5–15)
AST: 46 U/L — ABNORMAL HIGH (ref 15–41)
Albumin: 4.2 g/dL (ref 3.5–5.0)
Alkaline Phosphatase: 57 U/L (ref 38–126)
BILIRUBIN TOTAL: 1.1 mg/dL (ref 0.3–1.2)
BUN: 35 mg/dL — AB (ref 6–20)
CO2: 22 mmol/L (ref 22–32)
Calcium: 9.7 mg/dL (ref 8.9–10.3)
Chloride: 96 mmol/L — ABNORMAL LOW (ref 101–111)
Creatinine, Ser: 3.2 mg/dL — ABNORMAL HIGH (ref 0.61–1.24)
GFR, EST AFRICAN AMERICAN: 18 mL/min — AB (ref >60)
GFR, EST NON AFRICAN AMERICAN: 16 mL/min — AB (ref >60)
Glucose, Bld: 129 mg/dL — ABNORMAL HIGH (ref 65–99)
Potassium: 4.2 mmol/L (ref 3.5–5.1)
Sodium: 129 mmol/L — ABNORMAL LOW (ref 135–145)
TOTAL PROTEIN: 7.5 g/dL (ref 6.5–8.1)

## 2017-11-13 LAB — URINALYSIS, COMPLETE (UACMP) WITH MICROSCOPIC
BILIRUBIN URINE: NEGATIVE
Bacteria, UA: NONE SEEN
Glucose, UA: NEGATIVE mg/dL
Ketones, ur: NEGATIVE mg/dL
Leukocytes, UA: NEGATIVE
Nitrite: NEGATIVE
PH: 5 (ref 5.0–8.0)
Protein, ur: NEGATIVE mg/dL
SPECIFIC GRAVITY, URINE: 1.005 (ref 1.005–1.030)
SQUAMOUS EPITHELIAL / LPF: NONE SEEN

## 2017-11-13 LAB — CBC
HEMATOCRIT: 46.4 % (ref 40.0–52.0)
Hemoglobin: 15.6 g/dL (ref 13.0–18.0)
MCH: 33.2 pg (ref 26.0–34.0)
MCHC: 33.6 g/dL (ref 32.0–36.0)
MCV: 98.7 fL (ref 80.0–100.0)
Platelets: 147 10*3/uL — ABNORMAL LOW (ref 150–440)
RBC: 4.7 MIL/uL (ref 4.40–5.90)
RDW: 13.5 % (ref 11.5–14.5)
WBC: 9.3 10*3/uL (ref 3.8–10.6)

## 2017-11-13 LAB — LACTIC ACID, PLASMA: LACTIC ACID, VENOUS: 1.6 mmol/L (ref 0.5–1.9)

## 2017-11-13 MED ORDER — ACETAMINOPHEN 650 MG RE SUPP: 650.0000 mg | Freq: Four times a day (QID) | RECTAL | Status: DC | PRN

## 2017-11-13 MED ORDER — ISOSORBIDE MONONITRATE ER 30 MG PO TB24
30.0000 mg | ORAL_TABLET | Freq: Every day | ORAL | Status: DC
  Administered 2017-11-14 – 2017-11-19 (×6): 30 mg via ORAL
  Filled 2017-11-13 (×6): qty 1

## 2017-11-13 MED ORDER — ONDANSETRON HCL 4 MG/2ML IJ SOLN
4.0000 mg | Freq: Four times a day (QID) | INTRAMUSCULAR | Status: DC | PRN
  Administered 2017-11-15: 4 mg via INTRAVENOUS
  Filled 2017-11-13: qty 2

## 2017-11-13 MED ORDER — AMLODIPINE BESYLATE 5 MG PO TABS
5.0000 mg | ORAL_TABLET | Freq: Every day | ORAL | Status: DC
  Administered 2017-11-14 – 2017-11-19 (×6): 5 mg via ORAL
  Filled 2017-11-13 (×6): qty 1

## 2017-11-13 MED ORDER — SODIUM CHLORIDE 0.9 % IV SOLN
INTRAVENOUS | Status: DC
  Administered 2017-11-13 – 2017-11-19 (×8): via INTRAVENOUS

## 2017-11-13 MED ORDER — POTASSIUM CHLORIDE CRYS ER 10 MEQ PO TBCR: 10.0000 meq | EXTENDED_RELEASE_TABLET | Freq: Every day | ORAL | Status: DC

## 2017-11-13 MED ORDER — APIXABAN 2.5 MG PO TABS
2.5000 mg | ORAL_TABLET | Freq: Two times a day (BID) | ORAL | Status: DC
  Administered 2017-11-14 – 2017-11-19 (×11): 2.5 mg via ORAL
  Filled 2017-11-13 (×13): qty 1

## 2017-11-13 MED ORDER — DEXTROSE 5 % IV SOLN
10.0000 mg/kg | INTRAVENOUS | Status: DC
  Administered 2017-11-13: 685 mg via INTRAVENOUS
  Filled 2017-11-13 (×3): qty 13.7

## 2017-11-13 MED ORDER — HALOPERIDOL LACTATE 5 MG/ML IJ SOLN
2.0000 mg | Freq: Four times a day (QID) | INTRAMUSCULAR | Status: DC | PRN
  Administered 2017-11-16: 2 mg via INTRAVENOUS
  Filled 2017-11-13 (×2): qty 0.4

## 2017-11-13 MED ORDER — SODIUM CHLORIDE 0.9 % IV BOLUS (SEPSIS)
1000.0000 mL | Freq: Once | INTRAVENOUS | Status: AC
  Administered 2017-11-13: 1000 mL via INTRAVENOUS

## 2017-11-13 MED ORDER — CHOLECALCIFEROL 10 MCG (400 UNIT) PO TABS
400.0000 [IU] | ORAL_TABLET | Freq: Every day | ORAL | Status: DC
  Administered 2017-11-14 – 2017-11-19 (×6): 400 [IU] via ORAL
  Filled 2017-11-13 (×7): qty 1

## 2017-11-13 MED ORDER — ACETAMINOPHEN 325 MG PO TABS
650.0000 mg | ORAL_TABLET | Freq: Four times a day (QID) | ORAL | Status: DC | PRN
  Administered 2017-11-15 – 2017-11-16 (×2): 650 mg via ORAL
  Filled 2017-11-13 (×2): qty 2

## 2017-11-13 MED ORDER — ONDANSETRON HCL 4 MG PO TABS: 4.0000 mg | ORAL_TABLET | Freq: Four times a day (QID) | ORAL | Status: DC | PRN

## 2017-11-13 MED ORDER — CO Q-10 100 MG PO CAPS: 100.0000 mg | ORAL_CAPSULE | Freq: Every day | ORAL | Status: DC

## 2017-11-13 NOTE — ED Provider Notes (Addendum)
[facility] Emergency Department Provider Note  Time seen: 3:42 PM  I have reviewed the triage vital signs and the nursing notes.   HISTORY  Chief Complaint Herpes Zoster; Hallucinations; and Loss of Vision    HPI Darryl Novak is a 84 y.o. male with a past medical history of hypertension, prior MI, prior CVA in 1984, presents to the emergency department for confusion and hallucinations.  According to the son approximate 1 week ago the patient developed decreased vision in the left eye, several days later developed decreased vision in the right eye.  Was seen by ophthalmology had vitreous cultures performed that grew out positive for varicella-zoster.  Patient had localized antiviral injections to both eyes, and was started on systemic Valtrex.  Son states he was referred to Dr. Roosevelt Locks at [facility].  Recently was admitted to [facility] approximately 5 days ago at that time had a fairly normal workup.  Son states the patient was doing good with him decreasing vision over the past several days.  States occasional nausea and decreased appetite due to the Valtrex.  States today the patient awoke very confused, very unsteady on his gait, and was hallucinating talking to people who were not in the room, did not know where he was or who was with him.  Son states he has never acted like this in the past, at baseline the patient is very high functioning and largely independent.  Son brought him to the emergency department today given the major change in mental status hallucinations and unsteady gait.  Son denies any known fever.  Patient is acutely confused and cannot contribute to his history.  Past Medical History:  Diagnosis Date  . Heart trouble   . Hypertension   . MI (myocardial infarction) ([place]) 1978  . Neuropathy   . Shingles   . Stroke ([place])   . Ulcer     Patient Active Problem List   Diagnosis Date Noted  . Chronic atrial fibrillation ([place]) 04/10/2016  . Obstructive sleep  apnea syndrome 10/17/2015  . Cerebrovascular accident, old 09/19/2014  . Arteriosclerosis of coronary artery 03/28/2014  . HLD (hyperlipidemia) 03/28/2014  . BP (high blood pressure) 03/28/2014  . Addison anemia 03/28/2014  . Peripheral vascular disease ([place]) 03/28/2014  . Cerebrovascular accident (CVA) ([place]) 03/28/2014    History reviewed. No pertinent surgical history.  Prior to Admission medications   Medication Sig Start Date End Date Taking? Authorizing Provider  amLODipine (NORVASC) 5 MG tablet Take 5 mg by mouth daily.    [provider]  amLODipine (NORVASC) 5 MG tablet TAKE 1 TABLET BY MOUTH DAILY 02/13/17   [provider]  apixaban (ELIQUIS) 5 MG TABS tablet  09/11/15   [provider]  Cholecalciferol (VITAMIN D PO) Take by mouth.    [provider]  Coenzyme Q10 (CO Q-10) 100 MG CAPS Take by mouth.    [provider]  cyanocobalamin (,VITAMIN B-12,) 1000 MCG/ML injection Inject into the muscle. 02/08/14   [provider]  furosemide (LASIX) 20 MG tablet Take by mouth. 07/19/15 07/18/16  [provider]  gabapentin (NEURONTIN) 600 MG tablet  03/27/15   [provider]  isosorbide mononitrate (IMDUR) 30 MG 24 hr tablet Take by mouth. 09/16/14   [provider]  potassium chloride (KLOR-CON M10) 10 MEQ tablet  02/20/15   [provider]  simvastatin (ZOCOR) 20 MG tablet Take 20 mg by mouth daily.    [provider]  telmisartan (MICARDIS) 40  MG tablet Take 40 mg by mouth daily.    [provider]    Allergies  Allergen Reactions  . Ace Inhibitors Other (See Comments)    Intolerant    History reviewed. No pertinent family history.  Social History Social History   Tobacco Use  . Smoking status: Former Smoker    Types: Cigarettes  . Smokeless tobacco: Never Used  . Tobacco comment: quit 45 years ago  Substance Use Topics  . Alcohol use: Yes    Comment: 2 drinks  a night  . Drug use: No    Review of Systems Unable to obtain an adequate/accurate review of systems due to confusion and altered mental status ____________________________________________   PHYSICAL EXAM:  VITAL SIGNS: ED Triage Vitals  Enc Vitals Group     BP 11/13/17 1401 110/62     Pulse Rate 11/13/17 1401 (!) 102     Resp 11/13/17 1401 18     Temp 11/13/17 1401 98.1 F (36.7 C)     Temp Source 11/13/17 1401 Oral     SpO2 11/13/17 1401 96 %     Weight 11/13/17 1405 160 lb (72.6 kg)     Height 11/13/17 1405 5\' 8"  (1.727 m)     Head Circumference --      Peak Flow --      Pain Score --      Pain Loc --      Pain Edu? --      Excl. in [place]? --    Constitutional: Alert, no distress.  Actively confused, talking to people not in the room, did not know he was at the hospital. Eyes: Significant conjunctival injection bilaterally with scleral hemorrhage in the right eye.  Unable to adequately examine visual fields, etc. due to acute confusion and altered mental status. ENT   Head: Normocephalic and atraumatic   Mouth/Throat: Somewhat dry mucous membranes Cardiovascular: Normal rate, regular rhythm around 100 bpm. Respiratory: Normal respiratory effort without tachypnea nor retractions. Breath sounds are clear Gastrointestinal: Soft and nontender. No distention.  Musculoskeletal: Nontender with normal range of motion in all extremities.  Neurologic:  Normal speech and language. No gross focal neurologic deficits  Skin:  Skin is warm, dry and intact.  Psychiatric: Mood and affect are normal.   ____________________________________________    INITIAL IMPRESSION / ASSESSMENT AND PLAN / ED COURSE  Pertinent labs & imaging results that were available during my care of the patient were reviewed by me and considered in my medical decision making (see chart for details).  Patient presents to the emergency department for acute confusion, altered mental status, unsteady gait.   Differential is quite broad but would include infectious etiology including possible encephalopathy from herpes zoster, metabolic abnormality, medication reaction.  Patient's labs have resulted with a normal white blood cell count.  His chemistry is significantly abnormal with a low sodium of 129 and a creatinine of 3.2.  I reviewed the patient's records including recent ophthalmology notes recent [facility] workup, 5 days ago he had a normal creatinine, consistent with acute renal failure.  I reviewed adverse reactions of Valtrex it does not appear to be likely related to Valtrex unless his oral intake had diminished severely.  I discussed the patient with on-call ophthalmology at [facility] who will be discussing with Dr. Roosevelt Locks.  Believes encephalopathy from herpes zoster to be very unlikely, believe the hallucinations could be related to Valtrex, unclear where the renal failure is originating from.  We will begin IV hydration  we will obtain a bladder scan to rule out retention although no signs on exam.  Patient will deftly require admission to the hospital, it is not clear if he will require transfer or could be admitted locally at this time, awaiting further recommendations from ophthalmology.  Discussed the patient with [facility] ophthalmology they recommend acyclovir renally dosed but no other recommendations as far as the eye at this time.  We will place a Foley catheter given naproxen 500 cc of urine in the bladder and patient is unable to urinate.  We will send a urinalysis.  Patient will be admitted to the hospitalist service for further treatment.  I discussed the patient with Dr. Roosevelt Locks via the patient's son's cell phone.  Dr. Roosevelt Locks states she believe the patient can be adequately managed here and does not believe he needs transfer at this time.  Agrees with renally dosed IV acyclovir.  Patient now has a Foley catheter in hopefully urinary retention as a cause of the patient's renal dysfunction which should  improve. ____________________________________________   FINAL CLINICAL IMPRESSION(S) / ED DIAGNOSES  Hallucinations Altered mental status Confusion Acute renal failure    Harvest Dark, MD 11/13/17 1735    Harvest Dark, MD 11/13/17 [phone]

## 2017-11-13 NOTE — H&P (Signed)
[place] [facility] [place] NAME: Darryl Novak    MR#:  [id number]  DATE OF BIRTH:  1925/11/05  DATE OF ADMISSION:  11/13/2017  PRIMARY CARE PHYSICIAN: Baxter Hire, MD   REQUESTING/REFERRING PHYSICIAN: Dr. Harvest Dark  CHIEF COMPLAINT:   Chief Complaint  Patient presents with  . Herpes Zoster  . Hallucinations  . Loss of Vision    HISTORY OF PRESENT ILLNESS:  Darryl Novak  is a 84 y.o. male with a known history of hypertension, neuropathy, history of previous CVA, previous history of MI who presents to the hospital due to altered mental status. Patient about 2 weeks ago developed acute vision loss to his left eye. He was seen by Dr. Wallace Going here at the [facility] and then referred to [facility]. 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 Patient was started on high-dose Valtrex. Patient has been on high-dose Valtrex for a few days but is not tolerating it well and has he developed some nausea and vomiting with it. This morning the patient's son noticed that the patient was hallucinating and seeing and hearing things that were not there. He also has lost some balance and was having some gait instability and ambulating. He was therefore brought to the ER for further evaluation. In the emergency room patient underwent routine blood work which showed that she was in acute kidney injury with a creatinine of 3. Patient was also noted to be dehydrated. The ER physician spoke to ophthalmology at [facility] and the recommended renally dosing his antivirals and hydrating with IV fluids. There was a possible concern for disseminated zoster but this is less likely and patient is clinically afebrile and hemodynamically stable and not showing any signs of overt encephalitis. Hospitalist services were contacted further treatment  evaluation.  PAST MEDICAL HISTORY:   Past Medical History:  Diagnosis Date  . Heart trouble   . Hypertension   . MI (myocardial infarction) ([place]) 1978  . Neuropathy   . Shingles   . Stroke ([place])   . Ulcer     PAST SURGICAL HISTORY:  History reviewed. No pertinent surgical history.  SOCIAL HISTORY:   Social History   Tobacco Use  . Smoking status: Former Smoker    Types: Cigarettes  . Smokeless tobacco: Never Used  . Tobacco comment: quit 45 years ago  Substance Use Topics  . Alcohol use: Yes    Comment: 2 drinks a night    FAMILY HISTORY:   Family History  Problem Relation Age of Onset  . Heart attack Father     DRUG ALLERGIES:   Allergies  Allergen Reactions  . Ace Inhibitors Other (See Comments)    Intolerant    REVIEW OF SYSTEMS:   Review of Systems  Constitutional: Negative for fever and weight loss.  HENT: Negative for congestion, nosebleeds and tinnitus.   Eyes: Positive for blurred vision and redness. Negative for double vision.  Respiratory: Negative for cough, hemoptysis and shortness of breath.   Cardiovascular: Negative for chest pain, orthopnea, leg swelling and PND.  Gastrointestinal: Negative for abdominal pain, diarrhea, melena, nausea and vomiting.  Genitourinary: Negative for dysuria, hematuria and urgency.  Musculoskeletal: Positive for falls. Negative for joint pain.  Neurological: Positive for weakness. Negative for dizziness, tingling, sensory change, focal weakness, seizures and headaches.  Endo/Heme/Allergies: Negative for polydipsia. Does not bruise/bleed easily.  Psychiatric/Behavioral: Negative for  depression and memory loss. The patient is not nervous/anxious.     MEDICATIONS AT HOME:   Prior to Admission medications   Medication Sig Start Date End Date Taking? Authorizing Provider  amLODipine (NORVASC) 5 MG tablet Take 5 mg by mouth daily.   Yes [provider]  apixaban (ELIQUIS) 5 MG TABS tablet Take 5 mg by  mouth 2 (two) times daily.    Yes [provider]  cholecalciferol (VITAMIN D) 400 units TABS tablet Take 400 Units by mouth daily.   Yes [provider]  Coenzyme Q10 (CO Q-10) 100 MG CAPS Take 100 mg by mouth daily.    Yes [provider]  furosemide (LASIX) 20 MG tablet Take 20 mg by mouth daily.    Yes [provider]  gabapentin (NEURONTIN) 600 MG tablet Take 600 mg by mouth 2 (two) times daily.    Yes [provider]  isosorbide mononitrate (IMDUR) 30 MG 24 hr tablet Take 30 mg by mouth daily.    Yes [provider]  potassium chloride (KLOR-CON M10) 10 MEQ tablet Take 10 mEq by mouth daily.    Yes [provider]  valACYclovir (VALTREX) 500 MG tablet Take 2,000 mg by mouth 3 (three) times daily. 11/10/17 11/20/17 Yes [provider]      VITAL SIGNS:  Blood pressure 135/80, pulse 86, temperature 98.1 F (36.7 C), temperature source Oral, resp. rate 18, height 5\' 8"  (1.727 m), weight 72.6 kg (160 lb), SpO2 100 %.  PHYSICAL EXAMINATION:  Physical Exam  GENERAL:  84 y.o.-year-old patient lying in the bed confused but in NAD.  EYES: Patient eyes are injected and red, he has some mild clear discharge. He is lost his vision in the left eye. No scleral icterus.  HEENT: Head atraumatic, normocephalic. Oropharynx and nasopharynx clear. No oropharyngeal erythema, moist oral mucosa  NECK:  Supple, no jugular venous distention. No thyroid enlargement, no tenderness.  LUNGS: Normal breath sounds bilaterally, no wheezing, rales, rhonchi. No use of accessory muscles of respiration.  CARDIOVASCULAR: S1, S2 RRR. No murmurs, rubs, gallops, clicks.  ABDOMEN: Soft, nontender, nondistended. Bowel sounds present. No organomegaly or mass.  EXTREMITIES: No pedal edema, cyanosis, or clubbing. + 2 pedal & radial pulses b/l.   NEUROLOGIC: Cranial nerves II through XII are intact. No focal Motor or sensory deficits appreciated b/l. Globally  weak.  PSYCHIATRIC: The patient is alert and oriented x 1.  SKIN: No obvious rash, lesion, or ulcer.   LABORATORY PANEL:   CBC Recent Labs  Lab 11/13/17 1414  WBC 9.3  HGB 15.6  HCT 46.4  PLT 147*   ------------------------------------------------------------------------------------------------------------------  Chemistries  Recent Labs  Lab 11/13/17 1414  NA 129*  K 4.2  CL 96*  CO2 22  GLUCOSE 129*  BUN 35*  CREATININE 3.20*  CALCIUM 9.7  AST 46*  ALT 19  ALKPHOS 57  BILITOT 1.1   ------------------------------------------------------------------------------------------------------------------  Cardiac Enzymes No results for input(s): TROPONINI in the last 168 hours. ------------------------------------------------------------------------------------------------------------------  RADIOLOGY:  No results found.   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  1. Altered mental status-I suspect this is metabolic encephalopathy secondary to acute kidney injury, patient being on high-dose Neurontin with acute kidney injury and dehydration. -I  do not suspect the patient has underlying zoster encephalitis. -Continue supportive care with IV fluid hydration, follow BUN/creatinine and follow mental status. -Hold Neurontin for now.  2. Acute kidney injury-secondary to dehydration and nausea from patient being on high-dose Valtrex that he was not tolerating. -We'll hydrate the patient with IV fluids, follow BUN and creatinine urine output. Renal dosed meds, avoid nephrotoxins. -Of renal function not improving consider getting a nephrology consult.  3. 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 He's extensively followed by [facility] ophthalmology. He is currently on oral Valtrex. -DC his Valtrex for now given his acute kidney injury and nausea vomiting. Place him on renal dosed acyclovir. -If needed consider ophthalmology consult. -Continue supportive care, I will get infectious disease involved and discussed the case with Dr. Ola Spurr will see the patient tomorrow.  4. Hyponatremia-mild secondary to the nausea and dehydration. -We'll hydrate the patient with IV fluids, follow sodium. Hold Lasix.  5. Neuropathy-hold gabapentin given his altered mental status and his acute kidney injury for now.  6. History of previous CVA-continue renally dosed Eliquis  7. Essential hypertension-continue Norvasc, Imdur.  All the records are reviewed and case discussed with ED provider. Management plans discussed with the patient, family and they are in agreement.  CODE STATUS: Full code  TOTAL TIME TAKING CARE OF THIS PATIENT: 45 minutes.    Henreitta Leber M.D on 11/13/2017 at 6:54 PM  Between 7am to 6pm - Pager - [phone]  After 6pm go to [email or web address] - password [email or web address] [place] [facility]  Office  [phone]  CC: Primary care physician; Baxter Hire, MD

## 2017-11-13 NOTE — ED Notes (Signed)
Pt given water and sandwich tray

## 2017-11-13 NOTE — ED Triage Notes (Signed)
Pt to ED with son with multiple complaints.  Per son patient was dx with shingles to bilateral eye sockets this past Monday, started on Valtrex on Tuesday and had antibiotic injections the same day.  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  Today has been having auditory and visual hallucinations per the son and unable to walk with assistance.  Pt followed by Dr. Isaias Sakai with ophthalmology.

## 2017-11-13 NOTE — ED Notes (Signed)
Patient placed in family wait room.

## 2017-11-13 NOTE — ED Notes (Signed)
Bladder scan performed. Findings no greater than 532ml. MD notified.

## 2017-11-13 NOTE — Progress Notes (Signed)

## 2017-11-13 NOTE — ED Notes (Signed)
First nurse note  Per family he is currently being treated for shingles in eyes  Has been given high doses of valtrex  Now thinks he may be having "dementia" sxs' from meds

## 2017-11-14 ENCOUNTER — Inpatient Hospital Stay: Payer: Medicare Other

## 2017-11-14 LAB — CBC
HCT: 37.8 % — ABNORMAL LOW (ref 40.0–52.0)
Hemoglobin: 13.3 g/dL (ref 13.0–18.0)
MCH: 34.1 pg — ABNORMAL HIGH (ref 26.0–34.0)
MCHC: 35.2 g/dL (ref 32.0–36.0)
MCV: 96.9 fL (ref 80.0–100.0)
PLATELETS: 119 10*3/uL — AB (ref 150–440)
RBC: 3.9 MIL/uL — ABNORMAL LOW (ref 4.40–5.90)
RDW: 13.3 % (ref 11.5–14.5)
WBC: 7.9 10*3/uL (ref 3.8–10.6)

## 2017-11-14 LAB — BASIC METABOLIC PANEL
ANION GAP: 9 (ref 5–15)
BUN: 33 mg/dL — AB (ref 6–20)
CALCIUM: 8.7 mg/dL — AB (ref 8.9–10.3)
CO2: 19 mmol/L — ABNORMAL LOW (ref 22–32)
Chloride: 103 mmol/L (ref 101–111)
Creatinine, Ser: 3.34 mg/dL — ABNORMAL HIGH (ref 0.61–1.24)
GFR calc Af Amer: 17 mL/min — ABNORMAL LOW (ref >60)
GFR, EST NON AFRICAN AMERICAN: 15 mL/min — AB (ref >60)
GLUCOSE: 99 mg/dL (ref 65–99)
Potassium: 4.4 mmol/L (ref 3.5–5.1)
SODIUM: 131 mmol/L — AB (ref 135–145)

## 2017-11-14 MED ORDER — PREDNISONE 20 MG PO TABS
60.0000 mg | ORAL_TABLET | Freq: Every day | ORAL | Status: DC
  Administered 2017-11-14 – 2017-11-19 (×6): 60 mg via ORAL
  Filled 2017-11-14 (×6): qty 3

## 2017-11-14 MED ORDER — VALACYCLOVIR HCL 500 MG PO TABS
1000.0000 mg | ORAL_TABLET | Freq: Every day | ORAL | Status: DC
  Administered 2017-11-14 – 2017-11-19 (×6): 1000 mg via ORAL
  Filled 2017-11-14 (×7): qty 2

## 2017-11-14 NOTE — Consult Note (Signed)
[facility] Infectious Disease     Reason for Consult: VZV retinitis   Referring Physician: Claria Dice Date of Admission:  11/13/2017   Active Problems:   Acute renal failure (ARF) ([place])   HPI: Darryl Novak is a 84 y.o. 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 He was seen at [facility] and foscarnet intraocular injection bilaterally and  started on valtrex but developed NV and then hallucinating and imbalance.  Found to have ARF with Cr 3, no fever, wbc nml.  Currently he has complete vision loss. Family at bedside. He is very HOH but family reports he remains a little confused. No skin lesion, fevers chills. Has hx of shingles in past x 2.   Past Medical History:  Diagnosis Date  . Heart trouble   . Hypertension   . MI (myocardial infarction) ([place]) 1978  . Neuropathy   . Shingles   . Stroke ([place])   . Ulcer    History reviewed. No pertinent surgical history. Social History   Tobacco Use  . Smoking status: Former Smoker    Types: Cigarettes  . Smokeless tobacco: Never Used  . Tobacco comment: quit 45 years ago  Substance Use Topics  . Alcohol use: Yes    Comment: 2 drinks a night  . Drug use: No   Family History  Problem Relation Age of Onset  . Heart attack Father     Allergies:  Allergies  Allergen Reactions  . Ace Inhibitors Other (See Comments)    Intolerant    Current antibiotics: Antibiotics Given (last 72 hours)    Date/Time Action Medication Dose Rate   11/13/17 1859 New Bag/Given   acyclovir (ZOVIRAX) 685 mg in dextrose 5 % 100 mL IVPB 685 mg 113.7 mL/hr      MEDICATIONS: . amLODipine  5 mg Oral Daily  . apixaban  2.5 mg Oral BID  . cholecalciferol  400 Units Oral Daily  . isosorbide mononitrate  30 mg Oral Daily    Review of Systems - 11 systems reviewed and negative per HPI   OBJECTIVE: Temp:  [97.7 F (36.5 C)-98.3 F (36.8 C)] 97.8 F (36.6 C) (02/15 1405) Pulse Rate:   [86-108] 108 (02/15 1405) Resp:  [16-20] 16 (02/15 1405) BP: (121-141)/(60-89) 141/89 (02/15 1405) SpO2:  [95 %-100 %] 97 % (02/15 1405) Physical Exam  Constitutional: He is HOH but alert and interactive HENT: bil eyes with conj injection, pupils dilated.  Mouth/Throat: Oropharynx is clear and dry . No oropharyngeal exudate.  Cardiovascular: Normal rate, regular rhythm and normal heart sounds. Exam reveals no gallop and no friction rub.  No murmur heard.  Pulmonary/Chest: Effort normal and breath sounds normal. No respiratory distress. He has no wheezes.  Abdominal: Soft. Bowel sounds are normal. He exhibits no distension. There is no tenderness.  Lymphadenopathy:  He has no cervical adenopathy.  Neurological: He is alert and interactive  Skin: bruise on scalp. No active zoster lesions Psychiatric: He has a normal mood and affect. His behavior is normal.     LABS: Results for orders placed or performed during the hospital encounter of 11/13/17 (from the past 48 hour(s))  CBC     Status: Abnormal   Collection Time: 11/13/17  2:14 PM  Result Value Ref Range   WBC 9.3 3.8 - 10.6 K/uL   RBC 4.70 4.40 - 5.90 MIL/uL   Hemoglobin 15.6 13.0 - 18.0 g/dL   HCT 46.4 40.0 -  52.0 %   MCV 98.7 80.0 - 100.0 fL   MCH 33.2 26.0 - 34.0 pg   MCHC 33.6 32.0 - 36.0 g/dL   RDW 13.5 11.5 - 14.5 %   Platelets 147 (L) 150 - 440 K/uL    Comment: Performed at [facility], [address] [place], [place] [address]  Comprehensive metabolic panel     Status: Abnormal   Collection Time: 11/13/17  2:14 PM  Result Value Ref Range   Sodium 129 (L) 135 - 145 mmol/L   Potassium 4.2 3.5 - 5.1 mmol/L   Chloride 96 (L) 101 - 111 mmol/L   CO2 22 22 - 32 mmol/L   Glucose, Bld 129 (H) 65 - 99 mg/dL   BUN 35 (H) 6 - 20 mg/dL   Creatinine, Ser 3.20 (H) 0.61 - 1.24 mg/dL   Calcium 9.7 8.9 - 10.3 mg/dL   Total Protein 7.5 6.5 - 8.1 g/dL   Albumin 4.2 3.5 - 5.0 g/dL   AST 46 (H) 15 - 41 U/L   ALT 19 17  - 63 U/L   Alkaline Phosphatase 57 38 - 126 U/L   Total Bilirubin 1.1 0.3 - 1.2 mg/dL   GFR calc non Af Amer 16 (L) >60 mL/min   GFR calc Af Amer 18 (L) >60 mL/min    Comment: (NOTE) The eGFR has been calculated using the CKD EPI equation. This calculation has not been validated in all clinical situations. eGFR's persistently <60 mL/min signify possible Chronic Kidney Disease.    Anion gap 11 5 - 15    Comment: Performed at [facility], [address] [place], [place] [address]  Lactic acid, plasma     Status: None   Collection Time: 11/13/17  2:14 PM  Result Value Ref Range   Lactic Acid, Venous 1.6 0.5 - 1.9 mmol/L    Comment: Performed at [facility], [address] [place], [place] [address]  Urinalysis, Complete w Microscopic     Status: Abnormal   Collection Time: 11/13/17  5:57 PM  Result Value Ref Range   Color, Urine YELLOW (A) YELLOW   APPearance CLEAR (A) CLEAR   Specific Gravity, Urine 1.005 1.005 - 1.030   pH 5.0 5.0 - 8.0   Glucose, UA NEGATIVE NEGATIVE mg/dL   Hgb urine dipstick MODERATE (A) NEGATIVE   Bilirubin Urine NEGATIVE NEGATIVE   Ketones, ur NEGATIVE NEGATIVE mg/dL   Protein, ur NEGATIVE NEGATIVE mg/dL   Nitrite NEGATIVE NEGATIVE   Leukocytes, UA NEGATIVE NEGATIVE   RBC / HPF 0-5 0 - 5 RBC/hpf   WBC, UA 0-5 0 - 5 WBC/hpf   Bacteria, UA NONE SEEN NONE SEEN   Squamous Epithelial / LPF NONE SEEN NONE SEEN   Amorphous Crystal PRESENT     Comment: Performed at [facility], [address] [place],  [address]  Basic metabolic panel     Status: Abnormal   Collection Time: 11/14/17  4:57 AM  Result Value Ref Range   Sodium 131 (L) 135 - 145 mmol/L   Potassium 4.4 3.5 - 5.1 mmol/L   Chloride 103 101 - 111 mmol/L   CO2 19 (L) 22 - 32 mmol/L   Glucose, Bld 99 65 - 99 mg/dL   BUN 33 (H) 6 - 20 mg/dL   Creatinine, Ser 3.34 (H) 0.61 - 1.24 mg/dL   Calcium 8.7 (L) 8.9 - 10.3 mg/dL   GFR calc non Af Amer 15 (L) >60  mL/min  GFR calc Af Amer 17 (L) >60 mL/min    Comment: (NOTE) The eGFR has been calculated using the CKD EPI equation. This calculation has not been validated in all clinical situations. eGFR's persistently <60 mL/min signify possible Chronic Kidney Disease.    Anion gap 9 5 - 15    Comment: Performed at [facility], [address] [place], [place] [address]  CBC     Status: Abnormal   Collection Time: 11/14/17  4:57 AM  Result Value Ref Range   WBC 7.9 3.8 - 10.6 K/uL   RBC 3.90 (L) 4.40 - 5.90 MIL/uL   Hemoglobin 13.3 13.0 - 18.0 g/dL   HCT 37.8 (L) 40.0 - 52.0 %   MCV 96.9 80.0 - 100.0 fL   MCH 34.1 (H) 26.0 - 34.0 pg   MCHC 35.2 32.0 - 36.0 g/dL   RDW 13.3 11.5 - 14.5 %   Platelets 119 (L) 150 - 440 K/uL    Comment: Performed at [facility], [address] [place], [place] [address]   No components found for: ESR, C REACTIVE PROTEIN MICRO: No results found for this or any previous visit (from the past 720 hour(s)).  IMAGING: US Renal  Result Date: 11/14/2017 CLINICAL DATA:  Acute renal failure EXAM: RENAL / URINARY TRACT ULTRASOUND COMPLETE COMPARISON:  None. FINDINGS: Right Kidney: Length: 12 cm. Echogenicity within normal limits. No mass or hydronephrosis visualized. Left Kidney: Length: 11 cm. Echogenicity within normal limits. No mass or hydronephrosis visualized. Bladder: There is a Foley balloon catheter that projects inferior and posterior to the bladder, presumably at the level of the prostate. Bladder wall is negative given under distention. Findings discussed with RN Barnett Applebaum. IMPRESSION: 1. Inflated Foley catheter balloon does not overlap the bladder, suspected the catheter is inflated in the prostatic urethra. 2. No hydronephrosis.  Normal renal size. Electronically Signed   By: Monte Fantasia M.D.   On: 11/14/2017 10:43   US Carotid Bilateral  Result Date: 10/30/2017 CLINICAL DATA:  Retinal artery occlusion. Cardiovascular risk factors include  hypertension, stroke/TIA, known carotid disease, hyperlipidemia, tobacco use EXAM: BILATERAL CAROTID DUPLEX ULTRASOUND TECHNIQUE: Pearline Cables scale imaging, color Doppler and duplex ultrasound were performed of bilateral carotid and vertebral arteries in the neck. COMPARISON:  No prior duplex FINDINGS: Criteria: Quantification of carotid stenosis is based on velocity parameters that correlate the residual internal carotid diameter with NASCET-based stenosis levels, using the diameter of the distal internal carotid lumen as the denominator for stenosis measurement. The following velocity measurements were obtained: RIGHT ICA:  Systolic 814 cm/sec, Diastolic 25 cm/sec CCA:  59 cm/sec SYSTOLIC ICA/CCA RATIO:  2.2 ECA:  106 cm/sec LEFT ICA:  Systolic 481 cm/sec, Diastolic 25 cm/sec CCA:  74 cm/sec SYSTOLIC ICA/CCA RATIO:  1.9 ECA:  355 cm/sec Right Brachial SBP: Not acquired Left Brachial SBP: Not acquired RIGHT CAROTID ARTERY: No significant calcifications of the right common carotid artery. Intermediate waveform maintained. Heterogeneous and partially calcified plaque at the right carotid bifurcation. No significant lumen shadowing. Low resistance waveform of the right ICA. No significant tortuosity. RIGHT VERTEBRAL ARTERY: Antegrade flow with low resistance waveform. LEFT CAROTID ARTERY: No significant calcifications of the left common carotid artery. Intermediate waveform maintained. Heterogeneous and partially calcified plaque at the left carotid bifurcation without significant lumen shadowing. Low resistance waveform of the left ICA. No significant tortuosity. LEFT VERTEBRAL ARTERY:  Antegrade flow with low resistance waveform. IMPRESSION: Right: Heterogeneous and partially calcified plaque at the right carotid bifurcation contributes to 50%-69%  stenosis by established duplex criteria. Left: Color duplex indicates moderate heterogeneous and calcified plaque, with no hemodynamically significant stenosis by duplex criteria  in the extracranial cerebrovascular circulation. Signed, Dulcy Fanny. Earleen [place], DO [facility] Radiology Electronically Signed   By: Corrie Mckusick D.O.   On: 10/30/2017 14:50   Varicella (VZV) PCR, Other Varicella (VZV) PCR, Other  Component Value Ref Range Performed At  VZV PCR Positive (A) Negative [facility]   Varicella (VZV) PCR, Other  Specimen  Fluid, Vitreous/Aqueous - Eye, Right   Varicella (VZV) PCR, Other  Narrative Performed At  Specimen Source: Eye, Right  -  Varicella zoster virus real-time PCR targets the immediate-early 62 gene ([id number]).This test was developed and its performance characteristics determined by the [facility].This laboratory is CAP accredited and CLIA certified to perform high complexity testing.This test has not been approved by the [place] Food and Drug Administration.However, such approval is not required for clinical implementation and test results have been shown to be clinically useful.Results from this test should be interpreted in conjunction with other laboratory and clinical data.  [facility]      Assessment:   Darryl Novak is a 84 y.o. male with recently diagnosed VZV acute retinal necrosis. He has seen retinal specialist at [facility] and VZV PCR +. He was started on valtrex but has had NV and poor po intake and now has ARF. Also mild confusion, but no fevers and no signs of acute encephalitis. He has also had  foscarnet injection in both eyes. HIs vision is quite poor and per family [facility] ophtho was not very hopeful for vision recovery. His course is complicated by his ARF.    Recommendations Agree with renal to avoid IV acyclovir and use oral renal dosed valtrex 1000 mg qd.  Can increase if renal fxn improves. Per ophtho note yesterday they did suggest start steroids but there was some concern it would worsen delirium.  I  discussed with Dr Tressia Miners and think we can start prednisone 60 mg qd  I do not see a need for MRI or LP at this point- I think his mild delirium could be explained by his acute loss of vision and the disorientation created by it. He has no skin lesions so no need for airborne isolation.  Thank you very much for allowing me to participate in the care of this patient. Please call with questions.   Cheral Marker. Ola Spurr, MD

## 2017-11-14 NOTE — Consult Note (Signed)
[facility] CONSULT NOTE    Date: 11/14/2017                  Patient Name:  Darryl Novak  MRN: [id number]  DOB: Feb 19, 1926  Age / Sex: 84 y.o., male         PCP: Baxter Hire, MD                 Service Requesting Consult: Hospitalist                 Reason for Consult: Acute renal failure            History of Present Illness: Patient is a 84 y.o. male with a PMHx of hypertension, myocardial infarction, history of CVA, who was admitted to [facility] on 11/13/2017 for evaluation of hallucinations and loss of vision.  The patient has history of recent herpetic retinitis.  He was seeing [facility] ophthalmology for this issue.  He has received valacyclovir as well as foscarnet as an outpatient.  We are asked to see him for evaluation management of acute renal failure.  His baseline creatinine appears to be 1.0.  Patient unable to offer any history.  His wife is at the bedside and does offer some history.  She states that she was attempting to keep up with his by mouth fluid intake.  He is noted to be on Lasix.  He's had some nausea but no reported vomiting or diarrhea.   Medications: Outpatient medications: Medications Prior to Admission  Medication Sig Dispense Refill Last Dose  . amLODipine (NORVASC) 5 MG tablet Take 5 mg by mouth daily.   11/13/2017 at am  . apixaban (ELIQUIS) 5 MG TABS tablet Take 5 mg by mouth 2 (two) times daily.    11/13/2017 at 0800  . cholecalciferol (VITAMIN D) 400 units TABS tablet Take 400 Units by mouth daily.   11/13/2017 at am  . Coenzyme Q10 (CO Q-10) 100 MG CAPS Take 100 mg by mouth daily.    11/13/2017 at am  . furosemide (LASIX) 20 MG tablet Take 20 mg by mouth daily.    11/13/2017 at am  . gabapentin (NEURONTIN) 600 MG tablet Take 600 mg by mouth 2 (two) times daily.    11/13/2017 at am  . isosorbide mononitrate (IMDUR) 30 MG 24 hr tablet Take 30 mg by mouth daily.    11/13/2017 at 0800  . potassium chloride (KLOR-CON M10) 10 MEQ tablet  Take 10 mEq by mouth daily.    11/13/2017 at am  . valACYclovir (VALTREX) 500 MG tablet Take 2,000 mg by mouth 3 (three) times daily.   11/13/2017 at pm    Current medications: Current Facility-Administered Medications  Medication Dose Route Frequency Provider Last Rate Last Dose  . 0.9 %  sodium chloride infusion   Intravenous Continuous Henreitta Leber, MD 100 mL/hr at 11/14/17 5093    . acetaminophen (TYLENOL) tablet 650 mg  650 mg Oral Q6H PRN Henreitta Leber, MD       Or  . acetaminophen (TYLENOL) suppository 650 mg  650 mg Rectal Q6H PRN Henreitta Leber, MD      . acyclovir (ZOVIRAX) 685 mg in dextrose 5 % 100 mL IVPB  10 mg/kg (Ideal) Intravenous Q24H Harvest Dark, MD   Stopped at 11/13/17 2000  . amLODipine (NORVASC) tablet 5 mg  5 mg Oral Daily Henreitta Leber, MD   5 mg at 11/14/17 1123  . apixaban (ELIQUIS) tablet  2.5 mg  2.5 mg Oral BID Henreitta Leber, MD   2.5 mg at 11/14/17 1123  . cholecalciferol (VITAMIN D) tablet 400 Units  400 Units Oral Daily Henreitta Leber, MD   400 Units at 11/14/17 1123  . haloperidol lactate (HALDOL) injection 2 mg  2 mg Intravenous Q6H PRN Henreitta Leber, MD      . isosorbide mononitrate (IMDUR) 24 hr tablet 30 mg  30 mg Oral Daily Henreitta Leber, MD   30 mg at 11/14/17 1123  . ondansetron (ZOFRAN) tablet 4 mg  4 mg Oral Q6H PRN Henreitta Leber, MD       Or  . ondansetron (ZOFRAN) injection 4 mg  4 mg Intravenous Q6H PRN Henreitta Leber, MD          Allergies: Allergies  Allergen Reactions  . Ace Inhibitors Other (See Comments)    Intolerant      Past Medical History: Past Medical History:  Diagnosis Date  . Heart trouble   . Hypertension   . MI (myocardial infarction) ([place]) 1978  . Neuropathy   . Shingles   . Stroke ([place])   . Ulcer      Past Surgical History: History reviewed. No pertinent surgical history.   Family History: Family History  Problem Relation Age of Onset  . Heart attack Father       Social History: Social History   Socioeconomic History  . Marital status: Married    Spouse name: Not on file  . Number of children: Not on file  . Years of education: Not on file  . Highest education level: Not on file  Social Needs  . Financial resource strain: Not on file  . Food insecurity - worry: Not on file  . Food insecurity - inability: Not on file  . Transportation needs - medical: Not on file  . Transportation needs - non-medical: Not on file  Occupational History  . Not on file  Tobacco Use  . Smoking status: Former Smoker    Types: Cigarettes  . Smokeless tobacco: Never Used  . Tobacco comment: quit 45 years ago  Substance and Sexual Activity  . Alcohol use: Yes    Comment: 2 drinks a night  . Drug use: No  . Sexual activity: Not Currently  Other Topics Concern  . Not on file  Social History Narrative  . Not on file     Review of Systems: As per HPI  Vital Signs: Blood pressure (!) 141/89, pulse (!) 108, temperature 97.8 F (36.6 C), temperature source Oral, resp. rate 16, height 5\' 8"  (1.727 m), weight 72.6 kg (160 lb), SpO2 97 %.  Weight trends: Filed Weights   11/13/17 1405  Weight: 72.6 kg (160 lb)    Physical Exam: General: NAD, sitting in bed  Head: Herpetic lesions noted on head  Eyes: Both erythematous, drainaged noted  Nose: Mucous membranes moist, not inflammed, nonerythematous.  Throat: Oropharynx nonerythematous, no exudate appreciated.   Neck: Supple, trachea midline.  Lungs:  Normal respiratory effort. Clear to auscultation BL without crackles or wheezes.  Heart: RRR. S1 and S2 normal without gallop, murmur, or rubs.  Abdomen:  BS normoactive. Soft, Nondistended, non-tender.  No masses or organomegaly.  Extremities: No pretibial edema.  Neurologic: Awake, but confused, appears to be having some hallucinations as he grabs for things in the air  Skin: Herpetic lesions on scalp    Lab results: Basic Metabolic  Panel: Recent Labs  Lab 11/13/17  1414 11/14/17 0457  NA 129* 131*  K 4.2 4.4  CL 96* 103  CO2 22 19*  GLUCOSE 129* 99  BUN 35* 33*  CREATININE 3.20* 3.34*  CALCIUM 9.7 8.7*    Liver Function Tests: Recent Labs  Lab 11/13/17 1414  AST 46*  ALT 19  ALKPHOS 57  BILITOT 1.1  PROT 7.5  ALBUMIN 4.2   No results for input(s): LIPASE, AMYLASE in the last 168 hours. No results for input(s): AMMONIA in the last 168 hours.  CBC: Recent Labs  Lab 11/13/17 1414 11/14/17 0457  WBC 9.3 7.9  HGB 15.6 13.3  HCT 46.4 37.8*  MCV 98.7 96.9  PLT 147* 119*    Cardiac Enzymes: No results for input(s): CKTOTAL, CKMB, CKMBINDEX, TROPONINI in the last 168 hours.  BNP: Invalid input(s): POCBNP  CBG: No results for input(s): GLUCAP in the last 168 hours.  Microbiology: No results found for this or any previous visit.  Coagulation Studies: No results for input(s): LABPROT, INR in the last 72 hours.  Urinalysis: Recent Labs    11/13/17 1757  COLORURINE YELLOW*  LABSPEC 1.005  PHURINE 5.0  GLUCOSEU NEGATIVE  HGBUR MODERATE*  BILIRUBINUR NEGATIVE  KETONESUR NEGATIVE  PROTEINUR NEGATIVE  NITRITE NEGATIVE  LEUKOCYTESUR NEGATIVE      Imaging: US Renal  Result Date: 11/14/2017 CLINICAL DATA:  Acute renal failure EXAM: RENAL / URINARY TRACT ULTRASOUND COMPLETE COMPARISON:  None. FINDINGS: Right Kidney: Length: 12 cm. Echogenicity within normal limits. No mass or hydronephrosis visualized. Left Kidney: Length: 11 cm. Echogenicity within normal limits. No mass or hydronephrosis visualized. Bladder: There is a Foley balloon catheter that projects inferior and posterior to the bladder, presumably at the level of the prostate. Bladder wall is negative given under distention. Findings discussed with RN Barnett Applebaum. IMPRESSION: 1. Inflated Foley catheter balloon does not overlap the bladder, suspected the catheter is inflated in the prostatic urethra. 2. No hydronephrosis.  Normal renal  size. Electronically Signed   By: Monte Fantasia M.D.   On: 11/14/2017 10:43      Assessment & Plan: Pt is a 84 y.o. male with a PMHx of hypertension, myocardial infarction, history of CVA, who was admitted to [facility] on 11/13/2017 for evaluation of hallucinations and loss of vision.    1.  Acute renal failure.  2.  Hyponatremia. 3.  Herpetic retinitis.   Plan:  The patient has been troubled with herpetic retinitis recently.  He has received both intraocular injections of foscarnet as well as by mouth valacyclovir.  However he has now been switched to acyclovir intravenous.  We recommend discontinuation of acyclovir as this can be nephrotoxic.  The reason for his acute renal failure is currently unclear however suspect that he may have had decreased by mouth intake given the hallucinations that he's having now.  Continue IV fluid hydration as prescribed.  No urgent indication for dialysis at the moment however this may need to be considered if renal function continues to deteriorate.  Further plan as patient progresses.

## 2017-11-14 NOTE — Evaluation (Signed)
Physical Therapy Evaluation Patient Details Name: Darryl Novak MRN: [id number] DOB: [birth year] Today's Date: 11/14/2017   History of Present Illness  Pt is an 84 y.o. male presenting to hospital 11/13/17 with confusion, hallucinations, and unsteady gait; also with decreased vision L eye about 1 week prior and decreased R vision several days later (seen by ophthalmology and found to be (+) with varicella zoster).  Pt admitted to hospital with AMS (metabolic encephalopathy secondary acute kidney injury and panuveitis).  PMH includes htn, h/o MI, CVA 1984, shingles, neuropathy, OSA, chronic a-fib.  Clinical Impression  Prior to hospital admission and recent medical concerns, pt was independent with ambulation and active.  Pt lives with his wife in 1 level home with 1 step to enter with R railing.  Currently pt is mod assist supine to sit; min to mod assist x2 to stand with RW (posterior lean noted standing requiring assist to shift weight forward), and min assist x2 to ambulate 8 feet with RW with close chair follow (limited distance ambulating d/t pt fatigue/weakness).  Overall pt appearing confused intermittently during session and communication complicated d/t pt's HOH.  No c/o pain.  Pt had difficulty following visual testing.  Increased time required for pt to perform activities during session d/t needing increased time to follow commands d/t HOH, confusion, and visual impairments (requiring vc's, tactile cues, and hand over hand cueing).  Pt would benefit from skilled PT to address noted impairments and functional limitations (see below for any additional details).  Also recommend OT consult.  Upon hospital discharge, recommend pt discharge to [place] [facility].    Follow Up Recommendations SNF    Equipment Recommendations  Rolling walker with 5" wheels    Recommendations for Other Services OT consult     Precautions / Restrictions Precautions Precautions: Fall Restrictions Weight Bearing Restrictions:  No      Mobility  Bed Mobility Overal bed mobility: Needs Assistance Bed Mobility: Supine to Sit     Supine to sit: Mod assist;HOB elevated     General bed mobility comments: assist for B LE's and trunk supine to sit; vc's and hand over hand tactile cues required for technique  Transfers Overall transfer level: Needs assistance Equipment used: Rolling walker (2 wheeled) Transfers: Sit to/from [facility] Sit to Stand: Min assist;Mod assist;+2 physical assistance Stand pivot transfers: Min assist;+2 physical assistance(assist to steady)       General transfer comment: assist to initiate stand and come to full upright posture with RW; vc's and hand over hand tactile cues required for technique  Ambulation/Gait Ambulation/Gait assistance: Min assist;+2 physical assistance(chair follow) Ambulation Distance (Feet): 8 Feet Assistive device: Rolling walker (2 wheeled)   Gait velocity: decrease   General Gait Details: short shuffling gait; unsteady; limited distance d/t fatigue and weakness  Stairs            Wheelchair Mobility    Modified Rankin (Stroke Patients Only)       Balance Overall balance assessment: Needs assistance Sitting-balance support: No upper extremity supported;Feet supported Sitting balance-Leahy Scale: Good Sitting balance - Comments: steady sitting reaching within BOS   Standing balance support: Bilateral upper extremity supported Standing balance-Leahy Scale: Poor Standing balance comment: requires B UE support for static standing balance; posterior lean noted in standing requiring assist to shift weight forward initially                             Pertinent Vitals/Pain Pain  Assessment: No/denies pain  Vitals (HR, O2 on room air, and BP) stable and WFL throughout treatment session.    Home Living Family/patient expects to be discharged to:: Private residence Living Arrangements: Spouse/significant  other Available Help at Discharge: Family Type of Home: House Home Access: Stairs to enter Entrance Stairs-Rails: Right Entrance Stairs-Number of Steps: 1 Home Layout: One level Home Equipment: Grab bars - tub/shower;Walker - 4 wheels      Prior Function Level of Independence: Independent with assistive device(s)         Comments: Pt ambulating without AD (pt's wife encouraged pt to use rollator but pt doesn't use it; tends to hold onto furniture instead).  Generally active and independent.     Hand Dominance        Extremity/Trunk Assessment   Upper Extremity Assessment Upper Extremity Assessment: Generalized weakness(initially unable to lift L UE shoulder flexion past 30 degrees but after performing AAROM pt able to lift L UE to grossly 160 degrees (symmetrical to R side))    Lower Extremity Assessment Lower Extremity Assessment: Generalized weakness    Cervical / Trunk Assessment Cervical / Trunk Assessment: Normal  Communication   Communication: HOH(no hearing aides at hospital)  Cognition Arousal/Alertness: Awake/alert Behavior During Therapy: WFL for tasks assessed/performed Overall Cognitive Status: (Oriented to person, place, and situation)                                        General Comments General comments (skin integrity, edema, etc.): Pt resting in bed upon PT arrival; daughter and wife present.  Drainage noted B eyes (pt initially with eyes closed d/t crusty drainage but eventually able to open both eyes on own (R eye more open than L eye).  Nursing cleared pt for participation in physical therapy.  Pt agreeable to PT session.    Exercises  Transfer/mobility training   Assessment/Plan    PT Assessment Patient needs continued PT services  PT Problem List Decreased strength;Decreased activity tolerance;Decreased balance;Decreased mobility;Decreased cognition;Decreased knowledge of use of DME;Decreased knowledge of precautions        PT Treatment Interventions DME instruction;Gait training;Stair training;Functional mobility training;Therapeutic activities;Therapeutic exercise;Balance training;Patient/family education    PT Goals (Current goals can be found in the Care Plan section)  Acute Rehab PT Goals Patient Stated Goal: to be able to walk again PT Goal Formulation: With patient Time For Goal Achievement: 11/28/17 Potential to Achieve Goals: Good    Frequency Min 2X/week   Barriers to discharge Decreased caregiver support      Co-evaluation               AM-PAC PT "6 Clicks" Daily Activity  Outcome Measure Difficulty turning over in bed (including adjusting bedclothes, sheets and blankets)?: Unable Difficulty moving from lying on back to sitting on the side of the bed? : Unable Difficulty sitting down on and standing up from a chair with arms (e.g., wheelchair, bedside commode, etc,.)?: Unable Help needed moving to and from a bed to chair (including a wheelchair)?: Total Help needed walking in hospital room?: Total Help needed climbing 3-5 steps with a railing? : Total 6 Click Score: 6    End of Session Equipment Utilized During Treatment: Gait belt Activity Tolerance: Patient limited by fatigue Patient left: in chair;with call bell/phone within reach;with chair alarm set;with family/visitor present Nurse Communication: Mobility status;Precautions PT Visit Diagnosis: Unsteadiness on feet (R26.81);Other abnormalities of  gait and mobility (R26.89);Muscle weakness (generalized) (M62.81);History of falling (Z91.81);Difficulty in walking, not elsewhere classified (R26.2)    Time: 3200-3794 PT Time Calculation (min) (ACUTE ONLY): 52 min   Charges:   PT Evaluation $PT Eval Low Complexity: 1 Low PT Treatments $Therapeutic Activity: 23-37 mins   PT G CodesLeitha Bleak, PT 11/14/17, 3:41 PM [phone]

## 2017-11-14 NOTE — Progress Notes (Signed)
[name] at [place] NAME: Darryl Novak    MR#:  [id number]  DATE OF BIRTH:  November 23, 1925  SUBJECTIVE:  CHIEF COMPLAINT:   Chief Complaint  Patient presents with  . Herpes Zoster  . Hallucinations  . Loss of Vision   - still appears confused, though oriented x 2 - complains of right eye pain and visual loss  REVIEW OF SYSTEMS:  Review of Systems  Constitutional: Negative for chills, fever and malaise/fatigue.  HENT: Negative for congestion, ear discharge, hearing loss and nosebleeds.   Eyes: Positive for blurred vision, pain and redness.  Respiratory: Negative for cough, shortness of breath and wheezing.   Cardiovascular: Negative for chest pain and palpitations.  Gastrointestinal: Negative for abdominal pain, constipation, diarrhea, nausea and vomiting.  Genitourinary: Negative for dysuria.  Musculoskeletal: Positive for back pain.  Neurological: Negative for dizziness, speech change, focal weakness, seizures and headaches.  Psychiatric/Behavioral: Negative for depression.    DRUG ALLERGIES:   Allergies  Allergen Reactions  . Ace Inhibitors Other (See Comments)    Intolerant    VITALS:  Blood pressure 128/74, pulse (!) 103, temperature 97.7 F (36.5 C), temperature source Oral, resp. rate 20, height 5\' 8"  (1.727 m), weight 72.6 kg (160 lb), SpO2 96 %.  PHYSICAL EXAMINATION:  Physical Exam  GENERAL:  84 y.o.-year-old elderly patient lying in the bed with no acute distress.  EYES: Pupils equal, round. Right conjunctiva is much erythematous than left. Photosensitive.  No scleral icterus. Extraocular muscles intact.  HEENT: Head atraumatic, normocephalic. Oropharynx and nasopharynx clear.  NECK:  Supple, no jugular venous distention. No thyroid enlargement, no tenderness.  LUNGS: Normal breath sounds bilaterally, no wheezing, rales,rhonchi or crepitation. No use of accessory muscles of respiration. Decreased breath sounds at  the bases CARDIOVASCULAR: S1, S2 normal. No rubs, or gallops. 3/6 systolic murmur present ABDOMEN: Soft, nontender, nondistended. Bowel sounds present. No organomegaly or mass.  EXTREMITIES: No pedal edema, cyanosis, or clubbing.  NEUROLOGIC: Cranial nerves II through XII are intact. Muscle strength 5/5 in all extremities. Sensation intact. Gait not checked. Global weakness noted. PSYCHIATRIC: The patient is alert and oriented x 2.  SKIN: No obvious rash, lesion, or ulcer.    LABORATORY PANEL:   CBC Recent Labs  Lab 11/14/17 0457  WBC 7.9  HGB 13.3  HCT 37.8*  PLT 119*   ------------------------------------------------------------------------------------------------------------------  Chemistries  Recent Labs  Lab 11/13/17 1414 11/14/17 0457  NA 129* 131*  K 4.2 4.4  CL 96* 103  CO2 22 19*  GLUCOSE 129* 99  BUN 35* 33*  CREATININE 3.20* 3.34*  CALCIUM 9.7 8.7*  AST 46*  --   ALT 19  --   ALKPHOS 57  --   BILITOT 1.1  --    ------------------------------------------------------------------------------------------------------------------  Cardiac Enzymes No results for input(s): TROPONINI in the last 168 hours. ------------------------------------------------------------------------------------------------------------------  RADIOLOGY:  No results found.  EKG:   Orders placed or performed during the hospital encounter of 06/03/16  . EKG 12-Lead  . EKG 12-Lead  . EKG 12-Lead  . EKG 12-Lead  . EKG    ASSESSMENT AND PLAN:   84 year old male with past medical history significant for A. 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 Altered mental status-likely metabolic encephalopathy from renal failure. - Also he was on high doses of gabapentin which are on hold currently. -CT of the head negative for any acute  findings -No fevers, less likely to be encephalitis but  monitor -No focal neurological deficits at this time. -MRI of the brain was done last week at [facility] for visual loss with and without contrast, showed diffuse cerebral atrophy including hippocampal atrophy which can be related to cognitive decline. But no other masses or lesions noted.  2. Acute renal failure-renal ultrasound is pending, hasn't improved with fluids yet. -Avoid nephrotoxins. Receiving IV fluids -Foley catheter placed. No evidence of obstruction. Continue to monitor his urine output -According to his PCP notes from January 2019, patient was also on telmisartan and Lasix at home. Not sure when these were held. -Continue to monitor as has normal renal function at baseline  3. 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 -Aqueous cultures positive for varicella-zoster virus and had also had acute retinal necrosis. Bilateral foscarnet injections given -ID follow-up today to rule out any systemic infection.  -Currently on acyclovir -Currently on contact precautions   4. Hypertension-on Norvasc and Imdur.  5. Atrial fibrillation-likely secondary to underlying pulmonary hypertension. Rate controlled. Continue eliquis twice a day for anticoagulation. -Dose has been reduced on admission because of his worsened renal function   Physical therapy consult once more stable    All the records are reviewed and case discussed with Care Management/Social Workerr. Management plans discussed with the patient, family and they are in agreement.  CODE STATUS: Full Code  TOTAL TIME TAKING CARE OF THIS PATIENT: 38 minutes.   POSSIBLE D/C IN 2-3 DAYS, DEPENDING ON CLINICAL CONDITION.   Gladstone Lighter M.D on 11/14/2017 at 9:44 AM  Between 7am to 6pm - Pager - [phone]  After 6pm go to [email or web address] - password [email or web address] [facility]  Office  [phone]  CC: Primary care physician; Baxter Hire, MD

## 2017-11-15 LAB — BASIC METABOLIC PANEL
Anion gap: 11 (ref 5–15)
BUN: 39 mg/dL — ABNORMAL HIGH (ref 6–20)
CHLORIDE: 106 mmol/L (ref 101–111)
CO2: 17 mmol/L — AB (ref 22–32)
CREATININE: 3.17 mg/dL — AB (ref 0.61–1.24)
Calcium: 8.4 mg/dL — ABNORMAL LOW (ref 8.9–10.3)
GFR calc non Af Amer: 16 mL/min — ABNORMAL LOW (ref >60)
GFR, EST AFRICAN AMERICAN: 19 mL/min — AB (ref >60)
Glucose, Bld: 144 mg/dL — ABNORMAL HIGH (ref 65–99)
POTASSIUM: 4.8 mmol/L (ref 3.5–5.1)
Sodium: 134 mmol/L — ABNORMAL LOW (ref 135–145)

## 2017-11-15 NOTE — Clinical Social Work Note (Signed)
CSW attempted to visit the patient at bedside. At the time of the visit, the patient and his siblings were in attendance. However, the patient's spouse was not available. The family indicated that she is the HCPOA. CSW will attempt to visit again tomorrow and/or telephone the patient's spouse to discuss discharge planning.  Santiago Bumpers, MSW, Latanya Presser [phone]

## 2017-11-15 NOTE — Progress Notes (Signed)
[place] at [place] NAME: Darryl Novak    MR#:  [id number]  DATE OF BIRTH:  [birth year]  SUBJECTIVE:  CHIEF COMPLAINT:   Chief Complaint  Patient presents with  . Herpes Zoster  . Hallucinations  . Loss of Vision   - still appears confused, though oriented x 2 - complains of right eye pain and visual loss. Sitting in chair and comfortable. - Multiple family members including son, daughter-in-law, wife, friends in the room, as per them patient is more alert and comfortable today.  REVIEW OF SYSTEMS:  Review of Systems  Constitutional: Negative for chills, fever and malaise/fatigue.  HENT: Negative for congestion, ear discharge, hearing loss and nosebleeds.   Eyes: Positive for blurred vision, pain and redness.  Respiratory: Negative for cough, shortness of breath and wheezing.   Cardiovascular: Negative for chest pain and palpitations.  Gastrointestinal: Negative for abdominal pain, constipation, diarrhea, nausea and vomiting.  Genitourinary: Negative for dysuria.  Musculoskeletal: Positive for back pain.  Neurological: Negative for dizziness, speech change, focal weakness, seizures and headaches.  Psychiatric/Behavioral: Negative for depression.    DRUG ALLERGIES:   Allergies  Allergen Reactions  . Ace Inhibitors Other (See Comments)    Intolerant    VITALS:  Blood pressure 126/61, pulse (!) 101, temperature 97.6 F (36.4 C), temperature source Oral, resp. rate 18, height 5\' 8"  (1.727 m), weight 72.6 kg (160 lb), SpO2 98 %.  PHYSICAL EXAMINATION:  Physical Exam  GENERAL:  84 y.o.-year-old elderly patient lying in the bed with no acute distress.  EYES: Pupils equal, round. Right conjunctiva is much erythematous than left. Photosensitive.  No scleral icterus. Extraocular muscles intact.  HEENT: Head atraumatic, normocephalic. Oropharynx and nasopharynx clear.  NECK:  Supple, no jugular venous distention. No thyroid  enlargement, no tenderness.  LUNGS: Normal breath sounds bilaterally, no wheezing, rales,rhonchi or crepitation. No use of accessory muscles of respiration. Decreased breath sounds at the bases CARDIOVASCULAR: S1, S2 normal. No rubs, or gallops. 3/6 systolic murmur present ABDOMEN: Soft, nontender, nondistended. Bowel sounds present. No organomegaly or mass.  EXTREMITIES: No pedal edema, cyanosis, or clubbing.  NEUROLOGIC: Cranial nerves II through XII are intact. Muscle strength 4-5/5 in all extremities. Sensation intact. Gait not checked. Global weakness noted. PSYCHIATRIC: The patient is alert and oriented x 2.  SKIN: No obvious rash, lesion, or ulcer.    LABORATORY PANEL:   CBC Recent Labs  Lab 11/14/17 0457  WBC 7.9  HGB 13.3  HCT 37.8*  PLT 119*   ------------------------------------------------------------------------------------------------------------------  Chemistries  Recent Labs  Lab 11/13/17 1414  11/15/17 0444  NA 129*   < > 134*  K 4.2   < > 4.8  CL 96*   < > 106  CO2 22   < > 17*  GLUCOSE 129*   < > 144*  BUN 35*   < > 39*  CREATININE 3.20*   < > 3.17*  CALCIUM 9.7   < > 8.4*  AST 46*  --   --   ALT 19  --   --   ALKPHOS 57  --   --   BILITOT 1.1  --   --    < > = values in this interval not displayed.   ------------------------------------------------------------------------------------------------------------------  Cardiac Enzymes No results for input(s): TROPONINI in the last 168 hours. ------------------------------------------------------------------------------------------------------------------  RADIOLOGY:  US Renal  Result Date: 11/14/2017 CLINICAL DATA:  Acute renal failure EXAM: RENAL / URINARY TRACT ULTRASOUND COMPLETE  COMPARISON:  None. FINDINGS: Right Kidney: Length: 12 cm. Echogenicity within normal limits. No mass or hydronephrosis visualized. Left Kidney: Length: 11 cm. Echogenicity within normal limits. No mass or hydronephrosis  visualized. Bladder: There is a Foley balloon catheter that projects inferior and posterior to the bladder, presumably at the level of the prostate. Bladder wall is negative given under distention. Findings discussed with RN Barnett Applebaum. IMPRESSION: 1. Inflated Foley catheter balloon does not overlap the bladder, suspected the catheter is inflated in the prostatic urethra. 2. No hydronephrosis.  Normal renal size. Electronically Signed   By: Monte Fantasia M.D.   On: 11/14/2017 10:43    EKG:   Orders placed or performed during the hospital encounter of 06/03/16  . EKG 12-Lead  . EKG 12-Lead  . EKG 12-Lead  . EKG 12-Lead  . EKG    ASSESSMENT AND PLAN:   84 year old male with past medical history significant for A. 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 Altered mental status-likely metabolic encephalopathy from renal failure. - Also he was on high doses of gabapentin which are on hold currently. -CT of the head negative for any acute findings -No fevers, less likely to be encephalitis but monitor -No focal neurological deficits at this time. -MRI of the brain was done last week at [facility] for visual loss with and without contrast, showed diffuse cerebral atrophy including hippocampal atrophy which can be related to cognitive decline. But no other masses or lesions noted - improved some.  2. Acute renal failure-renal ultrasound is done, hasn't improved with fluids yet. -Avoid nephrotoxins. Receiving IV fluids -Foley catheter placed. No evidence of obstruction. Continue to monitor his urine output -According to his PCP notes from January 2019, patient was also on telmisartan and Lasix at home. Not sure when these were held. -Continue to monitor as has normal renal function at baseline - As per renal ultrasound, no signs of obstruction but his balloon of Foley catheter seems to be in due to trauma. Patient is not  in any discomfort currently and he does not have any hematuria and I confirmed these with his nurse also. So currently does not seem clinically that patient have balloon in the urethra and he is draining the urine nicely through the Foley catheter so we will just leave it in for now.  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 ophthalmology. -Aqueous cultures positive for varicella-zoster virus and had also had acute retinal necrosis. Bilateral foscarnet injections given -ID follow-up today to rule out any systemic infection.  - To admission started on acyclovir, but nephrologist and ID agreed on oral valacyclovir due to renal failure.  4. Hypertension-on Norvasc and Imdur.  5. Atrial fibrillation-likely secondary to underlying pulmonary hypertension. Rate controlled. Continue eliquis twice a day for anticoagulation. -Dose has been reduced on admission because of his worsened renal function   Physical therapy consult once more stable    All the records are reviewed and case discussed with Care Management/Social Workerr. Management plans discussed with the patient, family and they are in agreement.  CODE STATUS: Full Code  TOTAL TIME TAKING CARE OF THIS PATIENT: 35 minutes.   POSSIBLE D/C IN 2-3 DAYS, DEPENDING ON CLINICAL CONDITION.   Vaughan Basta M.D on 11/15/2017 at 3:01 PM  Between 7am to 6pm - Pager - [phone]  After 6pm go to [email or web address] - password [email or web address]  [employer]  Office  [phone]  CC: Primary care physician; Baxter Hire, MD

## 2017-11-15 NOTE — Progress Notes (Signed)
   11/15/17 1930  Clinical Encounter Type  Visited With Family (specifically patient spouse)  Visit Type Follow-up   While rounding chaplain met patient's spouse.  Conversation around patient's health changes in last weeks as well as spouse's own health issues.  Chaplain offered emotional support and encouraged self-care.

## 2017-11-15 NOTE — Evaluation (Signed)
Occupational Therapy Evaluation Patient Details Name: Darryl Novak MRN: [id number] DOB: [birth year] Today's Date: 11/15/2017    History of Present Illness Pt is an 84 y.o. male presenting to hospital 11/13/17 with confusion, hallucinations, and unsteady gait; also with decreased vision L eye about 1 week prior and decreased R vision several days later (seen by ophthalmology and found to be (+) with varicella zoster).  Pt admitted to hospital with AMS (metabolic encephalopathy secondary acute kidney injury and panuveitis).  PMH includes htn, h/o MI, CVA 1984, shingles, neuropathy, OSA, chronic a-fib.   Clinical Impression   Pt seen for OT evaluation this date. Prior to onset of vision loss <2 weeks ago, pt was generally independent with ADL, spouse endorses a couple falls in past 12 months, pt reaching for and lightly holding onto furniture to ambulate in the home (wasn't using rollator despite spouse's encouragement), and enjoys reading. Currently, pt presents with impairments in strength, balance, cognition (difficulty with simple commands), activity tolerance, and complete vision loss in L eye with minimal vision in R eye (able to correctly identify blue/red from approx 12 inches away). Pt requires mod assist for bed mobility with additional time/cues (verbal and tactile)/hand over hand assist to perform. Pt appears somewhat anxious, guarded, hesitant with movement, likely due to low vision. Spouse feeding pt upon OT's arrival, pt/spouse instructed in strategies to improve pt's independence with self feeding with verbal cues and allowing pt to hold hard plastic cup with lid and straw to drink his coffee with improved control and acceptance of drink when he was able to hold it, decreasing risk of aspiration. Pt/spouse will benefit from skilled OT services to address noted impairments and functional deficits in order to maximize functional independence and safety with mobility and self care tasks while  minimizing caregiver burden, falls risk, and need for long term higher level of care.   Recommend transition to STR to improve strength/mobility with ultimate goal of return home to familiar environment with additional Day services to focus on home safety/environmental set up, compensatory and adaptive strategies for low vision, and appropriate AE/DME/low vision technologies to allow pt maximal access to his environment and engagement in meaningful occupations, including books.     Follow Up Recommendations  SNF    Equipment Recommendations  Other (comment)(TBD)    Recommendations for Other Services       Precautions / Restrictions Precautions Precautions: Fall Restrictions Weight Bearing Restrictions: No      Mobility Bed Mobility Overal bed mobility: Needs Assistance Bed Mobility: Supine to Sit;Sit to Supine     Supine to sit: Mod assist Sit to supine: Mod assist   General bed mobility comments: max verbal/directional cues, tactile cues, and hand over hand for sequencing requiring mod assist for all bed mobility, difficulty following commands for scooting up in bed and sequencing  Transfers                      Balance Overall balance assessment: Needs assistance Sitting-balance support: No upper extremity supported;Feet supported Sitting balance-Leahy Scale: Good                                     ADL either performed or assessed with clinical judgement   ADL Overall ADL's : Needs assistance/impaired Eating/Feeding: Bed level;Set up;Minimal assistance;Cueing for safety;Cueing for compensatory techinques Eating/Feeding Details (indicate cue type and reason): spouse feeding pt upon OT's  arrival, pt/spouse instructed in ways to provide assist to increase independence and acceptance of food/drink due to low vision with pt able to hold cup with lid and straw to drink his coffee with supervision. Will benefit from further instruction in low vision  strategies and set up to improve independence. Grooming: Bed level;Set up;Minimal assistance   Upper Body Bathing: Bed level;Minimal assistance;Moderate assistance   Lower Body Bathing: Bed level;Moderate assistance   Upper Body Dressing : Bed level;Moderate assistance;Minimal assistance   Lower Body Dressing: Bed level;Maximal assistance;Moderate assistance                       Vision Baseline Vision/History: No visual deficits Patient Visual Report: Other (comment)(low vision over past ~1wk, L worse than R) Vision Assessment?: Yes;Vision impaired- to be further tested in functional context Eye Alignment: Within Functional Limits Alignment/Gaze Preference: Within Defined Limits Additional Comments: Difficult to assess due to pt being slightly confused and difficulty with following simple commands, but able to correctly indicate blue/red within 12 inches from face on R side, will continue to assess vision deficits     Perception     Praxis      Pertinent Vitals/Pain Pain Assessment: No/denies pain     Hand Dominance Right   Extremity/Trunk Assessment Upper Extremity Assessment Upper Extremity Assessment: Generalized weakness(grossly 4-/5 bilat shoulder flexion, 4/5 elbow bilat flex/ext, grip)   Lower Extremity Assessment Lower Extremity Assessment: Generalized weakness   Cervical / Trunk Assessment Cervical / Trunk Assessment: Normal   Communication Communication Communication: HOH(no hearing aides at hospital)   Cognition Arousal/Alertness: Awake/alert Behavior During Therapy: WFL for tasks assessed/performed Overall Cognitive Status: Impaired/Different from baseline Area of Impairment: Following commands                       Following Commands: Follows one step commands with increased time;Follows multi-step commands inconsistently       General Comments: pt alert and oriented, requires additional time/verbal and tactile cues to follow simple  commands   General Comments       Exercises     Shoulder Instructions      Home Living Family/patient expects to be discharged to:: Private residence Living Arrangements: Spouse/significant other Available Help at Discharge: Family Type of Home: House Home Access: Stairs to enter   Entrance Stairs-Rails: Right Home Layout: One level     Bathroom Shower/Tub: Teacher, early years/pre: [place]: Grab bars - tub/shower;Walker - 4 wheels;Toilet riser   Additional Comments: toiler riser in storage, spouse will have placed over commode prior to pt's eventual return home      Prior Functioning/Environment Level of Independence: Independent with assistive device(s)        Comments: Pt ambulating without AD (pt's wife encouraged pt to use rollator but pt doesn't use it; tends to hold onto furniture instead).  Generally active and independent.        OT Problem List: Decreased strength;Impaired vision/perception;Decreased knowledge of use of DME or AE;Decreased activity tolerance;Decreased cognition;Impaired balance (sitting and/or standing);Decreased safety awareness      OT Treatment/Interventions: Self-care/ADL training;Therapeutic exercise;Balance training;Therapeutic activities;Energy conservation;DME and/or AE instruction;Visual/perceptual remediation/compensation;Patient/family education;Cognitive remediation/compensation    OT Goals(Current goals can be found in the care plan section) Acute Rehab OT Goals Patient Stated Goal: be more independent with low vision OT Goal Formulation: With patient/family Time For Goal Achievement: 11/29/17 Potential to Achieve Goals: Good ADL Goals Pt Will  Perform Eating: with min assist;sitting;with caregiver independent in assisting(implementing compensatory strategies for low vision) Pt Will Transfer to Toilet: with min assist(LRAD, verbal/tactile cues as needed for low vision)  OT Frequency: Min 2X/week    Barriers to D/C:            Co-evaluation              AM-PAC PT "6 Clicks" Daily Activity     Outcome Measure Help from another person eating meals?: A Little Help from another person taking care of personal grooming?: A Little Help from another person toileting, which includes using toliet, bedpan, or urinal?: A Lot Help from another person bathing (including washing, rinsing, drying)?: A Lot Help from another person to put on and taking off regular upper body clothing?: A Little Help from another person to put on and taking off regular lower body clothing?: A Lot 6 Click Score: 15   End of Session    Activity Tolerance: Patient tolerated treatment well Patient left: in bed;with call bell/phone within reach;with bed alarm set;with family/visitor present  OT Visit Diagnosis: Other abnormalities of gait and mobility (R26.89);Low vision, both eyes (H54.2);Feeding difficulties (R63.3);Muscle weakness (generalized) (M62.81);History of falling (Z91.81)                Time: 6203-5597 OT Time Calculation (min): 45 min Charges:  OT General Charges $OT Visit: 1 Visit OT Evaluation $OT Eval Moderate Complexity: 1 Mod OT Treatments $Self Care/Home Management : 8-22 mins  Jeni Salles, MPH, MS, OTR/L ascom [phone] 11/15/17, 11:12 AM

## 2017-11-15 NOTE — Progress Notes (Signed)
   11/15/17 1215  Clinical Encounter Type  Visited With Patient and family together;Other (Comment) (witnesses and notary)  Visit Type Initial (order for advanced directive)  Referral From Nurse  Consult/Referral To Chaplain   Chaplain responded to page, patient and spouse seeking to complete advanced directives.  Chaplain reviewed procedure and understanding with the two parties.  Chaplain secured witnesses and notary.  Advanced directives completed for patient and spouse, copy placed in patient file, originals returned to patient and spouse.

## 2017-11-15 NOTE — Progress Notes (Signed)
[facility]  ROUNDING NOTE   Subjective:  Renal function about the same today. However urine output was good at 1.5 L. Patient resting comfortably.   Objective:  Vital signs in last 24 hours:  Temp:  [97.4 F (36.3 C)-98.4 F (36.9 C)] 97.6 F (36.4 C) (02/16 1415) Pulse Rate:  [98-108] 101 (02/16 1415) Resp:  [18-20] 18 (02/16 1415) BP: (122-147)/(61-76) 126/61 (02/16 1415) SpO2:  [94 %-98 %] 98 % (02/16 1415)  Weight change:  Filed Weights   11/13/17 1405  Weight: 72.6 kg (160 lb)    Intake/Output: I/O last 3 completed shifts: In: 6073 [P.O.:240; I.V.:3843] Out: 2125 [Urine:2125]   Intake/Output this shift:  No intake/output data recorded.  Physical Exam: General: No acute distress  Head: Excoriations on forehead  Eyes: Conjunctival erythema noted  Neck: Supple, trachea midline  Lungs:  Clear to auscultation, normal effort  Heart: S1S2 no rubs  Abdomen:  Soft, nontender, bowel sounds present  Extremities: No peripheral edema.  Neurologic: Awake, alert, following commands  Skin: No lesions       Basic Metabolic Panel: Recent Labs  Lab 11/13/17 1414 11/14/17 0457 11/15/17 0444  NA 129* 131* 134*  K 4.2 4.4 4.8  CL 96* 103 106  CO2 22 19* 17*  GLUCOSE 129* 99 144*  BUN 35* 33* 39*  CREATININE 3.20* 3.34* 3.17*  CALCIUM 9.7 8.7* 8.4*    Liver Function Tests: Recent Labs  Lab 11/13/17 1414  AST 46*  ALT 19  ALKPHOS 57  BILITOT 1.1  PROT 7.5  ALBUMIN 4.2   No results for input(s): LIPASE, AMYLASE in the last 168 hours. No results for input(s): AMMONIA in the last 168 hours.  CBC: Recent Labs  Lab 11/13/17 1414 11/14/17 0457  WBC 9.3 7.9  HGB 15.6 13.3  HCT 46.4 37.8*  MCV 98.7 96.9  PLT 147* 119*    Cardiac Enzymes: No results for input(s): CKTOTAL, CKMB, CKMBINDEX, TROPONINI in the last 168 hours.  BNP: Invalid input(s): POCBNP  CBG: No results for input(s): GLUCAP in the last 168 hours.  Microbiology: No  results found for this or any previous visit.  Coagulation Studies: No results for input(s): LABPROT, INR in the last 72 hours.  Urinalysis: Recent Labs    11/13/17 1757  COLORURINE YELLOW*  LABSPEC 1.005  PHURINE 5.0  GLUCOSEU NEGATIVE  HGBUR MODERATE*  BILIRUBINUR NEGATIVE  KETONESUR NEGATIVE  PROTEINUR NEGATIVE  NITRITE NEGATIVE  LEUKOCYTESUR NEGATIVE      Imaging: US Renal  Result Date: 11/14/2017 CLINICAL DATA:  Acute renal failure EXAM: RENAL / URINARY TRACT ULTRASOUND COMPLETE COMPARISON:  None. FINDINGS: Right Kidney: Length: 12 cm. Echogenicity within normal limits. No mass or hydronephrosis visualized. Left Kidney: Length: 11 cm. Echogenicity within normal limits. No mass or hydronephrosis visualized. Bladder: There is a Foley balloon catheter that projects inferior and posterior to the bladder, presumably at the level of the prostate. Bladder wall is negative given under distention. Findings discussed with RN Barnett Applebaum. IMPRESSION: 1. Inflated Foley catheter balloon does not overlap the bladder, suspected the catheter is inflated in the prostatic urethra. 2. No hydronephrosis.  Normal renal size. Electronically Signed   By: Monte Fantasia M.D.   On: 11/14/2017 10:43     Medications:   . sodium chloride 100 mL/hr at 11/15/17 1547   . amLODipine  5 mg Oral Daily  . apixaban  2.5 mg Oral BID  . cholecalciferol  400 Units Oral Daily  . isosorbide mononitrate  30  mg Oral Daily  . predniSONE  60 mg Oral Daily  . valACYclovir  1,000 mg Oral Daily   acetaminophen **OR** acetaminophen, haloperidol lactate, ondansetron **OR** ondansetron (ZOFRAN) IV  Assessment/ Plan:  84 y.o. male with a PMHx of hypertension, myocardial infarction, history of CVA, who was admitted to [facility] on 11/13/2017 for evaluation of hallucinations and loss of vision.    1.  Acute renal failure.  2.  Hyponatremia. 3.  Herpetic retinitis.   Plan:  Patient with good urine output at 1.5 L.  Renal  function slightly improved with a creatinine of 3.17.  Serum sodium also improved to 134.  Continue IV fluid hydration with 0.9 normal saline however we will reduce the rate to 75 cc/h.  Otherwise management of herpetic retinitis as per infectious disease.  He has had dose adjusted valacyclovir.  He is also on prednisone 60 mg p.o. daily for the retinitis.  Hopefully he will regain some vision.    LOS: 2 Shandria Clinch 2/16/20195:23 PM

## 2017-11-15 NOTE — Progress Notes (Signed)
Pt. Wants to wait for his wife before he agrees to wear CPAP

## 2017-11-16 LAB — BASIC METABOLIC PANEL
ANION GAP: 7 (ref 5–15)
BUN: 50 mg/dL — AB (ref 6–20)
CHLORIDE: 107 mmol/L (ref 101–111)
CO2: 21 mmol/L — ABNORMAL LOW (ref 22–32)
Calcium: 8.5 mg/dL — ABNORMAL LOW (ref 8.9–10.3)
Creatinine, Ser: 2.79 mg/dL — ABNORMAL HIGH (ref 0.61–1.24)
GFR calc Af Amer: 22 mL/min — ABNORMAL LOW (ref >60)
GFR, EST NON AFRICAN AMERICAN: 19 mL/min — AB (ref >60)
GLUCOSE: 152 mg/dL — AB (ref 65–99)
POTASSIUM: 4.6 mmol/L (ref 3.5–5.1)
Sodium: 135 mmol/L (ref 135–145)

## 2017-11-16 LAB — CBC
HEMATOCRIT: 36.2 % — AB (ref 40.0–52.0)
HEMOGLOBIN: 12.6 g/dL — AB (ref 13.0–18.0)
MCH: 34.3 pg — AB (ref 26.0–34.0)
MCHC: 34.9 g/dL (ref 32.0–36.0)
MCV: 98.3 fL (ref 80.0–100.0)
PLATELETS: 136 10*3/uL — AB (ref 150–440)
RBC: 3.68 MIL/uL — ABNORMAL LOW (ref 4.40–5.90)
RDW: 13.2 % (ref 11.5–14.5)
WBC: 8.9 10*3/uL (ref 3.8–10.6)

## 2017-11-16 MED ORDER — POLYETHYLENE GLYCOL 3350 17 G PO PACK
17.0000 g | PACK | Freq: Every day | ORAL | Status: DC
  Administered 2017-11-16 – 2017-11-19 (×4): 17 g via ORAL
  Filled 2017-11-16 (×4): qty 1

## 2017-11-16 MED ORDER — DOCUSATE SODIUM 100 MG PO CAPS
100.0000 mg | ORAL_CAPSULE | Freq: Two times a day (BID) | ORAL | Status: DC
  Administered 2017-11-16 – 2017-11-19 (×7): 100 mg via ORAL
  Filled 2017-11-16 (×7): qty 1

## 2017-11-16 MED ORDER — ENSURE ENLIVE PO LIQD
237.0000 mL | Freq: Three times a day (TID) | ORAL | Status: DC
  Administered 2017-11-16 – 2017-11-19 (×9): 237 mL via ORAL

## 2017-11-16 NOTE — NC FL2 (Signed)
[place] LEVEL OF CARE SCREENING TOOL     IDENTIFICATION  Patient Name: Darryl Novak Birthdate: 01-23-1926 Sex: male Admission Date (Current Location): 11/13/2017  [place] and [place] Number:  Engineering geologist and Address:  [facility], [address], [place], [place] [address]      Provider Number: [id number]  Attending Physician Name and Address:  Vaughan Basta, *  Relative Name and Phone Number:  Zadkiel Dragan (Spouse) [phone] or Jefry Lesinski (son) [phone]    Current Level of Care: Hospital Recommended Level of Care: [place] Prior Approval Number:    Date Approved/Denied: 11/16/17 PASRR Number: [id number]  Discharge Plan: SNF    Current Diagnoses: Patient Active Problem List   Diagnosis Date Noted  . Acute renal failure (ARF) ([place]) 11/13/2017  . Chronic atrial fibrillation ([place]) 04/10/2016  . Obstructive sleep apnea syndrome 10/17/2015  . Cerebrovascular accident, old 09/19/2014  . Arteriosclerosis of coronary artery 03/28/2014  . HLD (hyperlipidemia) 03/28/2014  . BP (high blood pressure) 03/28/2014  . [place]ison anemia 03/28/2014  . Peripheral vascular disease ([place]) 03/28/2014  . Cerebrovascular accident (CVA) ([place]) 03/28/2014    Orientation RESPIRATION BLADDER Height & Weight     Self, Time, Situation, Place  Normal Continent Weight: 160 lb (72.6 kg) Height:  5\' 8"  (172.7 cm)  BEHAVIORAL SYMPTOMS/MOOD NEUROLOGICAL BOWEL NUTRITION STATUS      Continent Diet(Heart healthy)  AMBULATORY STATUS COMMUNICATION OF NEEDS Skin   Extensive Assist Verbally Other (Comment)(Swelling of skin around the eyes)                       Personal Care Assistance Level of Assistance  Bathing, Feeding, Dressing Bathing Assistance: Limited assistance Feeding assistance: Independent Dressing Assistance: Limited assistance     Functional Limitations Info  Sight Sight Info: Impaired         SPECIAL CARE FACTORS FREQUENCY  PT (By licensed PT), OT (By licensed OT)     PT Frequency: Up to 5/week              Contractures Contractures Info: Not present    Additional Factors Info  Code Status, Allergies, Isolation Precautions Code Status Info: Full Allergies Info: Ace Inhibitors     Isolation Precautions Info: Contact precautions for herpes zoster     Current Medications (11/16/2017):  This is the current hospital active medication list Current Facility-Administered Medications  Medication Dose Route Frequency Provider Last Rate Last Dose  . 0.9 %  sodium chloride infusion   Intravenous Continuous Holley Raring, [place], MD 75 mL/hr at 11/16/17 0351    . acetaminophen (TYLENOL) tablet 650 mg  650 mg Oral Q6H PRN Henreitta Leber, MD   650 mg at 11/16/17 7824   Or  . acetaminophen (TYLENOL) suppository 650 mg  650 mg Rectal Q6H PRN Henreitta Leber, MD      . amLODipine (NORVASC) tablet 5 mg  5 mg Oral Daily Henreitta Leber, MD   5 mg at 11/16/17 0954  . apixaban (ELIQUIS) tablet 2.5 mg  2.5 mg Oral BID Henreitta Leber, MD   2.5 mg at 11/16/17 0955  . cholecalciferol (VITAMIN D) tablet 400 Units  400 Units Oral Daily Henreitta Leber, MD   400 Units at 11/16/17 0954  . docusate sodium (COLACE) capsule 100 mg  100 mg Oral BID Vaughan Basta, MD      . feeding supplement (ENSURE ENLIVE) (ENSURE ENLIVE) liquid 237 mL  237  mL Oral TID BM Vaughan Basta, MD   237 mL at 11/16/17 1045  . haloperidol lactate (HALDOL) injection 2 mg  2 mg Intravenous Q6H PRN Henreitta Leber, MD      . isosorbide mononitrate (IMDUR) 24 hr tablet 30 mg  30 mg Oral Daily Henreitta Leber, MD   30 mg at 11/16/17 0954  . ondansetron (ZOFRAN) tablet 4 mg  4 mg Oral Q6H PRN Henreitta Leber, MD       Or  . ondansetron (ZOFRAN) injection 4 mg  4 mg Intravenous Q6H PRN Henreitta Leber, MD   4 mg at 11/15/17 0202  . polyethylene glycol (MIRALAX / GLYCOLAX) packet 17 g  17 g Oral Daily  Vaughan Basta, MD      . predniSONE (DELTASONE) tablet 60 mg  60 mg Oral Daily Leonel Ramsay, MD   60 mg at 11/16/17 0955  . valACYclovir (VALTREX) tablet 1,000 mg  1,000 mg Oral Daily Leonel Ramsay, MD   1,000 mg at 11/16/17 4709     Discharge Medications: Please see discharge summary for a list of discharge medications.  Relevant Imaging Results:  Relevant Lab Results:   Additional Information SS# [id number]  Zettie Pho, LCSW

## 2017-11-16 NOTE — Clinical Social Work Note (Signed)
Clinical Social Work Assessment  Patient Details  Name: Darryl Novak MRN: [id number] Date of Birth: 04/19/1926  Date of referral:  11/16/17               Reason for consult:  Facility Placement                Permission sought to share information with:  Chartered certified accountant granted to share information::  Yes, Verbal Permission Granted  Name::        Agency::     Relationship::     Contact Information:     Housing/Transportation Living arrangements for the past 2 months:  Single Family Home Source of Information:  Patient, Medical Team, Friend/Neighbor, Spouse Patient Interpreter Needed:  None Criminal Activity/Legal Involvement Pertinent to Current Situation/Hospitalization:  No - Comment as needed Significant Relationships:  Adult Children, Church, [employer], Siblings, Spouse, Friend, Industrial/product designer, Other Family Members Lives with:  Spouse Do you feel safe going back to the place where you live?  Yes Need for family participation in patient care:  No (Coment)  Care giving concerns:  PT recommendation for STR   Social Worker assessment / plan:  CSW met with the patient, his friends, and his spouse at bedside to discuss discharge planning. All parties agreed that STR is the preference and named [place], [facility], or [name] as preferred locations with [facility] as a "just in case" due to location. The patient was much more alert and oriented today, and was more able to contribute to his plan.  At baseline, the patient is well supported in the community and is able to make medical decisions. The plan is discharge Tuesday or Wednesday. Should he discharge on Tuesday, his son will be the contact as his spouse will be in an outpatient procedure. The plan for transport is EMS or [facility] depending on appropriateness. CSW will follow up with bed offers as available.  Employment status:  Retired Nurse, adult PT Recommendations:   [place] / Referral to community resources:  [place]  Patient/Family's Response to care:  The family thanked the CSW.  Patient/Family's Understanding of and Emotional Response to Diagnosis, Current Treatment, and Prognosis:  The patient and family are in agreement with the discharge plan.  Emotional Assessment Appearance:  Appears stated age Attitude/Demeanor/Rapport:  Engaged, Gracious Affect (typically observed):  Appropriate, Pleasant Orientation:  Oriented to Self, Oriented to Place, Oriented to  Time, Oriented to Situation Alcohol / Substance use:  Never Used Psych involvement (Current and /or in the community):  No (Comment)  Discharge Needs  Concerns to be addressed:  Care Coordination, Discharge Planning Concerns Readmission within the last 30 days:  No Current discharge risk:  None Barriers to Discharge:  Continued Medical Work up   [employer], LCSW 11/16/2017, 3:40 PM

## 2017-11-16 NOTE — Progress Notes (Signed)
[facility] NAME: Darryl Novak    MR#:  [id number]  DATE OF BIRTH:  [birth year]  SUBJECTIVE:  CHIEF COMPLAINT:   Chief Complaint  Patient presents with  . Herpes Zoster  . Hallucinations  . Loss of Vision    - complains of right eye pain and visual loss. Sitting in chair and comfortable. - Multiple family members including son,  wife, friends in the room, as per them patient is more alert and comfortable today.  REVIEW OF SYSTEMS:  Review of Systems  Constitutional: Negative for chills, fever and malaise/fatigue.  HENT: Negative for congestion, ear discharge, hearing loss and nosebleeds.   Eyes: Positive for blurred vision, pain and redness.  Respiratory: Negative for cough, shortness of breath and wheezing.   Cardiovascular: Negative for chest pain and palpitations.  Gastrointestinal: Negative for abdominal pain, constipation, diarrhea, nausea and vomiting.  Genitourinary: Negative for dysuria.  Musculoskeletal: Positive for back pain.  Neurological: Negative for dizziness, speech change, focal weakness, seizures and headaches.  Psychiatric/Behavioral: Negative for depression.    DRUG ALLERGIES:   Allergies  Allergen Reactions  . Ace Inhibitors Other (See Comments)    Intolerant    VITALS:  Blood pressure (!) 144/80, pulse 98, temperature (!) 97.4 F (36.3 C), temperature source Oral, resp. rate 17, height 5\' 8"  (1.727 m), weight 72.6 kg (160 lb), SpO2 98 %.  PHYSICAL EXAMINATION:  Physical Exam  GENERAL:  84 y.o.-year-old elderly patient lying in the bed with no acute distress.  EYES: Pupils equal, round. Right conjunctiva is much erythematous than left. Photosensitive.  No scleral icterus. Extraocular muscles intact.  HEENT: Head atraumatic, normocephalic. Oropharynx and nasopharynx clear.  NECK:  Supple, no jugular venous distention. No thyroid enlargement, no tenderness.  LUNGS: Normal breath sounds  bilaterally, no wheezing, rales,rhonchi or crepitation. No use of accessory muscles of respiration. Decreased breath sounds at the bases CARDIOVASCULAR: S1, S2 normal. No rubs, or gallops. 3/6 systolic murmur present ABDOMEN: Soft, nontender, nondistended. Bowel sounds present. No organomegaly or mass.  EXTREMITIES: No pedal edema, cyanosis, or clubbing.  NEUROLOGIC: Cranial nerves II through XII are intact. Muscle strength 4-5/5 in all extremities. Sensation intact. Gait not checked. Global weakness noted. PSYCHIATRIC: The patient is alert and oriented x 3.  SKIN: No obvious rash, lesion, or ulcer.    LABORATORY PANEL:   CBC Recent Labs  Lab 11/16/17 0418  WBC 8.9  HGB 12.6*  HCT 36.2*  PLT 136*   ------------------------------------------------------------------------------------------------------------------  Chemistries  Recent Labs  Lab 11/13/17 1414  11/16/17 0418  NA 129*   < > 135  K 4.2   < > 4.6  CL 96*   < > 107  CO2 22   < > 21*  GLUCOSE 129*   < > 152*  BUN 35*   < > 50*  CREATININE 3.20*   < > 2.79*  CALCIUM 9.7   < > 8.5*  AST 46*  --   --   ALT 19  --   --   ALKPHOS 57  --   --   BILITOT 1.1  --   --    < > = values in this interval not displayed.   ------------------------------------------------------------------------------------------------------------------  Cardiac Enzymes No results for input(s): TROPONINI in the last 168 hours. ------------------------------------------------------------------------------------------------------------------  RADIOLOGY:  No results found.  EKG:   Orders placed or performed during the hospital encounter of 06/03/16  . EKG 12-Lead  . EKG 12-Lead  .  EKG 12-Lead  . EKG 12-Lead  . EKG    ASSESSMENT AND PLAN:   84 year old male with past medical history significant for A. 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  Altered mental status-likely metabolic encephalopathy from renal failure. - Also he was on high doses of gabapentin which are on hold currently. -CT of the head negative for any acute findings -No fevers, less likely to be encephalitis but monitor -No focal neurological deficits at this time. -MRI of the brain was done last week at [facility] for visual loss with and without contrast, showed diffuse cerebral atrophy including hippocampal atrophy which can be related to cognitive decline. But no other masses or lesions noted - improved now.  2. Acute renal failure-renal ultrasound is done, hasn't improved with fluids yet. -Avoid nephrotoxins. Receiving IV fluids -Foley catheter placed. No evidence of obstruction. Continue to monitor his urine output -According to his PCP notes from January 2019, patient was also on telmisartan and Lasix at home. Not sure when these were held. -Continue to monitor as has normal renal function at baseline - As per renal ultrasound, no signs of obstruction but his balloon of Foley catheter seems to be in due to trauma. Patient is not in any discomfort currently and he does not have any hematuria and I confirmed these with his nurse also. So currently does not seem clinically that patient have balloon in the urethra and he is draining the urine nicely through the Foley catheter so we will just leave it in for now. - renal func improved some.  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 -Aqueous cultures positive for varicella-zoster virus and had also had acute retinal necrosis. Bilateral foscarnet injections given -ID follow-up today to rule out any systemic infection.  - On admission started on IV acyclovir, but nephrologist and ID agreed on oral valacyclovir due to renal failure.  4. Hypertension-on Norvasc and Imdur.  5. Atrial fibrillation-likely secondary to underlying pulmonary hypertension. Rate controlled.  Continue eliquis twice a day for anticoagulation. -Dose has been reduced on admission because of his worsened renal function   Physical therapy suggested SNF.   All the records are reviewed and case discussed with Care Management/Social Workerr. Management plans discussed with the patient, family and they are in agreement.  CODE STATUS: Full Code  TOTAL TIME TAKING CARE OF THIS PATIENT: 35 minutes.   POSSIBLE D/C IN 2-3 DAYS, DEPENDING ON CLINICAL CONDITION.   Vaughan Basta M.D on 11/16/2017 at 2:24 PM  Between 7am to 6pm - Pager - [phone]  After 6pm go to [email or web address] - password [email or web address] [facility]  Office  [phone]  CC: Primary care physician; Baxter Hire, MD

## 2017-11-16 NOTE — Progress Notes (Signed)
[facility]  ROUNDING NOTE   Subjective:  Patient seen at bedside. Much more awake and alert today. Creatinine down to 2.79. BUN up to 50 the patient is on steroids.   Objective:  Vital signs in last 24 hours:  Temp:  [97.4 F (36.3 C)-97.6 F (36.4 C)] 97.4 F (36.3 C) (02/17 1217) Pulse Rate:  [78-101] 98 (02/17 1217) Resp:  [17-20] 17 (02/17 1217) BP: (109-144)/(52-83) 144/80 (02/17 1217) SpO2:  [93 %-98 %] 98 % (02/17 1217)  Weight change:  Filed Weights   11/13/17 1405  Weight: 72.6 kg (160 lb)    Intake/Output: I/O last 3 completed shifts: In: 2685 [P.O.:120; I.V.:2565] Out: 1125 [Urine:1125]   Intake/Output this shift:  Total I/O In: -  Out: 1125 [Urine:1125]  Physical Exam: General: No acute distress  Head: Excoriations on forehead  Eyes: Conjunctival erythema noted  Neck: Supple, trachea midline  Lungs:  Clear to auscultation, normal effort  Heart: S1S2 no rubs  Abdomen:  Soft, nontender, bowel sounds present  Extremities: No peripheral edema.  Neurologic: Awake, alert, following commands  Skin: No lesions       Basic Metabolic Panel: Recent Labs  Lab 11/13/17 1414 11/14/17 0457 11/15/17 0444 11/16/17 0418  NA 129* 131* 134* 135  K 4.2 4.4 4.8 4.6  CL 96* 103 106 107  CO2 22 19* 17* 21*  GLUCOSE 129* 99 144* 152*  BUN 35* 33* 39* 50*  CREATININE 3.20* 3.34* 3.17* 2.79*  CALCIUM 9.7 8.7* 8.4* 8.5*    Liver Function Tests: Recent Labs  Lab 11/13/17 1414  AST 46*  ALT 19  ALKPHOS 57  BILITOT 1.1  PROT 7.5  ALBUMIN 4.2   No results for input(s): LIPASE, AMYLASE in the last 168 hours. No results for input(s): AMMONIA in the last 168 hours.  CBC: Recent Labs  Lab 11/13/17 1414 11/14/17 0457 11/16/17 0418  WBC 9.3 7.9 8.9  HGB 15.6 13.3 12.6*  HCT 46.4 37.8* 36.2*  MCV 98.7 96.9 98.3  PLT 147* 119* 136*    Cardiac Enzymes: No results for input(s): CKTOTAL, CKMB, CKMBINDEX, TROPONINI in the last 168  hours.  BNP: Invalid input(s): POCBNP  CBG: No results for input(s): GLUCAP in the last 168 hours.  Microbiology: No results found for this or any previous visit.  Coagulation Studies: No results for input(s): LABPROT, INR in the last 72 hours.  Urinalysis: Recent Labs    11/13/17 1757  COLORURINE YELLOW*  LABSPEC 1.005  PHURINE 5.0  GLUCOSEU NEGATIVE  HGBUR MODERATE*  BILIRUBINUR NEGATIVE  KETONESUR NEGATIVE  PROTEINUR NEGATIVE  NITRITE NEGATIVE  LEUKOCYTESUR NEGATIVE      Imaging: No results found.   Medications:   . sodium chloride 75 mL/hr at 11/16/17 0351   . amLODipine  5 mg Oral Daily  . apixaban  2.5 mg Oral BID  . cholecalciferol  400 Units Oral Daily  . feeding supplement (ENSURE ENLIVE)  237 mL Oral TID BM  . isosorbide mononitrate  30 mg Oral Daily  . predniSONE  60 mg Oral Daily  . valACYclovir  1,000 mg Oral Daily   acetaminophen **OR** acetaminophen, haloperidol lactate, ondansetron **OR** ondansetron (ZOFRAN) IV  Assessment/ Plan:  84 y.o. male with a PMHx of hypertension, myocardial infarction, history of CVA, who was admitted to [facility] on 11/13/2017 for evaluation of hallucinations and loss of vision.    1.  Acute renal failure.  2.  Hyponatremia. 3.  Herpetic retinitis.   Plan:  Renal function has  slightly improved today.  Creatinine down to 2.79.  BUN higher at 50 however this is secondary to prednisone.  Maintain the patient on IV fluid hydration with 0.9 normal saline at 75 cc/h and monitor renal function daily.  Avoid nephrotoxins as possible.  Treatment of his herpetic retinitis as per infectious disease.    LOS: 3 Darryl Novak 2/17/201912:52 PM

## 2017-11-16 NOTE — Progress Notes (Signed)
Patient refused cpap 

## 2017-11-17 LAB — BASIC METABOLIC PANEL
Anion gap: 11 (ref 5–15)
BUN: 59 mg/dL — AB (ref 6–20)
CHLORIDE: 104 mmol/L (ref 101–111)
CO2: 22 mmol/L (ref 22–32)
Calcium: 8.9 mg/dL (ref 8.9–10.3)
Creatinine, Ser: 2.35 mg/dL — ABNORMAL HIGH (ref 0.61–1.24)
GFR calc Af Amer: 27 mL/min — ABNORMAL LOW (ref >60)
GFR, EST NON AFRICAN AMERICAN: 23 mL/min — AB (ref >60)
GLUCOSE: 147 mg/dL — AB (ref 65–99)
POTASSIUM: 4.1 mmol/L (ref 3.5–5.1)
SODIUM: 137 mmol/L (ref 135–145)

## 2017-11-17 NOTE — Clinical Social Work Note (Signed)
CSW presented bed offers to patient's [name] at [phone], and he will discuss it with patient's wife and then call CSW back with decision.  Jones Broom. [place], MSW, [place]  11/17/2017 2:11 PM

## 2017-11-17 NOTE — Progress Notes (Signed)
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 Cr improving.   ROS  Eleven systems are reviewed and negative except per hpi  Medications:  Antibiotics Given (last 72 hours)    Date/Time Action Medication Dose   11/14/17 2010 Given   valACYclovir (VALTREX) tablet 1,000 mg 1,000 mg   11/15/17 1040 Given   valACYclovir (VALTREX) tablet 1,000 mg 1,000 mg   11/16/17 0954 Given   valACYclovir (VALTREX) tablet 1,000 mg 1,000 mg   11/17/17 1130 Given   valACYclovir (VALTREX) tablet 1,000 mg 1,000 mg     . amLODipine  5 mg Oral Daily  . apixaban  2.5 mg Oral BID  . cholecalciferol  400 Units Oral Daily  . docusate sodium  100 mg Oral BID  . feeding supplement (ENSURE ENLIVE)  237 mL Oral TID BM  . isosorbide mononitrate  30 mg Oral Daily  . polyethylene glycol  17 g Oral Daily  . predniSONE  60 mg Oral Daily  . valACYclovir  1,000 mg Oral Daily    Objective: Vital signs in last 24 hours: Temp:  [96.5 F (35.8 C)-98 F (36.7 C)] 96.5 F (35.8 C) (02/18 1346) Pulse Rate:  [88-91] 91 (02/18 1346) Resp:  [16] 16 (02/18 1346) BP: (100-133)/(51-68) 117/51 (02/18 1346) SpO2:  [98 %-99 %] 99 % (02/18 1346) Constitutional: He is HOH but alert and interactive HENT: bil eyes with conj injection, pupils dilated. can see light only Mouth/Throat: Oropharynx is clear and dry . No oropharyngeal exudate.  Cardiovascular: Normal rate, regular rhythm and normal heart sounds. Exam reveals no gallop and no friction rub.  No murmur heard.  Pulmonary/Chest: Effort normal and breath sounds normal. No respiratory distress. He has no wheezes.  Abdominal: Soft. Bowel sounds are normal. He exhibits no distension. There is no tenderness.  Lymphadenopathy:  He has no cervical adenopathy.  Neurological:  He is alert and interactive  Skin: bruise on scalp. No active zoster lesions Psychiatric: He has a normal mood and affect. His behavior is normal.   Lab Results Recent Labs    11/16/17 0418 11/17/17 0405  WBC 8.9  --   HGB 12.6*  --   HCT 36.2*  --   NA 135 137  K 4.6 4.1  CL 107 104  CO2 21* 22  BUN 50* 59*  CREATININE 2.79* 2.35*    Microbiology: No results found for this or any previous visit.  Studies/Results: No results found.  Assessment/Plan: Darryl Novak  is a 84 y.o. male with recently diagnosed VZV acute retinal necrosis. He has seen retinal specialist at [facility] and VZV PCR +. He was started on valtrex but has had NV and poor po intake and now has ARF. Also mild confusion, but no fevers and no signs of acute encephalitis. He has also had  foscarnet injection in both eyes. HIs vision is quite poor and per family [facility] ophtho was not very hopeful for vision recovery. His course is complicated by his ARF.   Feb 18 - cr improving but vision not much better Recommendations Continue  oral renal dosed valtrex 1000 mg qd.  Continue prednisone and fu with ophtho. I do not see a need for MRI or LP at this point- I think his mild delirium could be explained by  his acute loss of vision and the disorientation created by it. He has no skin lesions so no need for airborne isolation.   Thank you very much for the consult. Will follow with you.  Leonel Ramsay   11/17/2017, 3:06 PM

## 2017-11-17 NOTE — Progress Notes (Signed)
Physical Therapy Treatment Patient Details Name: Darryl Novak MRN: [id number] DOB: 22-Nov-1925 Today's Date: 11/17/2017    History of Present Illness      PT Comments    Pt agreeable to PT; denies pain. Pt requires Min A and verbal/tactile cues as well as hand over hand assist with mobility due to visual deficits. Pt ambulates short distance bed to chair with assist for direction and rolling walker management. Participates with lower extremity exercises well up in the chair. Continue PT to progress strength and endurance and balance in stand (within visual deficits ability) to improve all functional mobility.    Follow Up Recommendations  SNF     Equipment Recommendations       Recommendations for Other Services       Precautions / Restrictions Precautions Precautions: Fall Restrictions Weight Bearing Restrictions: No    Mobility  Bed Mobility Overal bed mobility: Needs Assistance Bed Mobility: Supine to Sit     Supine to sit: Min assist     General bed mobility comments: Assist for trunk cues for direction  Transfers Overall transfer level: Needs assistance Equipment used: Rolling walker (2 wheeled) Transfers: Sit to/from [facility] Sit to Stand: Min assist         General transfer comment: verbal and tactile cues/hand over hand placement for hand placement and finding rw  Ambulation/Gait Ambulation/Gait assistance: Min assist Ambulation Distance (Feet): 5 Feet Assistive device: Rolling walker (2 wheeled) Gait Pattern/deviations: Step-to pattern   Gait velocity interpretation: <1.8 ft/sec, indicative of risk for recurrent falls General Gait Details: short walk bed to recliner. Requires Min A for rw management and directional cues   Stairs            Wheelchair Mobility    Modified Rankin (Stroke Patients Only)       Balance Overall balance assessment: Needs assistance Sitting-balance support: Bilateral upper extremity  supported;Feet supported Sitting balance-Leahy Scale: Good     Standing balance support: Bilateral upper extremity supported Standing balance-Leahy Scale: Fair                              Cognition Arousal/Alertness: Awake/alert Behavior During Therapy: WFL for tasks assessed/performed Overall Cognitive Status: Within Functional Limits for tasks assessed                                 General Comments: Greatest obstacle is pt's poor vision      Exercises General Exercises - Lower Extremity Quad Sets: Strengthening;Both;10 reps Gluteal Sets: Strengthening;Both;10 reps Long Arc Quad: AROM;Both;10 reps;Seated Hip ABduction/ADduction: AROM;Both;10 reps;Seated(single leg) Hip Flexion/Marching: AROM;Both;10 reps;Seated Toe Raises: AROM;Both;10 reps;Seated Heel Raises: AROM;Both;10 reps;Seated    General Comments        Pertinent Vitals/Pain Pain Assessment: No/denies pain    Home Living                      Prior Function            PT Goals (current goals can now be found in the care plan section) Progress towards PT goals: Progressing toward goals    Frequency    Min 2X/week      PT Plan Current plan remains appropriate    Co-evaluation              AM-PAC PT "6 Clicks" Daily Activity  Outcome Measure  Difficulty turning over in bed (including adjusting bedclothes, sheets and blankets)?: Unable Difficulty moving from lying on back to sitting on the side of the bed? : Unable Difficulty sitting down on and standing up from a chair with arms (e.g., wheelchair, bedside commode, etc,.)?: Unable Help needed moving to and from a bed to chair (including a wheelchair)?: A Little Help needed walking in hospital room?: A Lot Help needed climbing 3-5 steps with a railing? : Total 6 Click Score: 9    End of Session Equipment Utilized During Treatment: Gait belt Activity Tolerance: Patient tolerated treatment well Patient  left: in chair;with call bell/phone within reach;with chair alarm set;with family/visitor present   PT Visit Diagnosis: Unsteadiness on feet (R26.81);Other abnormalities of gait and mobility (R26.89);Muscle weakness (generalized) (M62.81);History of falling (Z91.81);Difficulty in walking, not elsewhere classified (R26.2)     Time: 4210-3128 PT Time Calculation (min) (ACUTE ONLY): 26 min  Charges:  $Gait Training: 8-22 mins $Therapeutic Exercise: 8-22 mins                    G Codes:        Larae Grooms, PTA 11/17/2017, 5:25 PM

## 2017-11-17 NOTE — Progress Notes (Signed)
Spoke with patient earlier about cpap. He confirms he was one at night however has chosen to not wear while here in hospital.

## 2017-11-17 NOTE — Care Management Important Message (Signed)
Important Message  Patient Details  Name: NAFTULI DALSANTO MRN: [id number] Date of Birth: [birth year]   Medicare Important Message Given:  Yes    Beverly Sessions, RN 11/17/2017, 3:35 PM

## 2017-11-17 NOTE — Progress Notes (Signed)
[place] [facility] [place] NAME: Darryl Novak    MR#:  [id number]  DATE OF BIRTH:  1926/07/27  SUBJECTIVE:  CHIEF COMPLAINT:   Chief Complaint  Patient presents with  . Herpes Zoster  . Hallucinations  . Loss of Vision    - complains of right eye pain and visual loss. Sitting in chair and comfortable. - patient is more alert and comfortable today. Renal function improved.  REVIEW OF SYSTEMS:  Review of Systems  Constitutional: Negative for chills, fever and malaise/fatigue.  HENT: Negative for congestion, ear discharge, hearing loss and nosebleeds.   Eyes: Positive for blurred vision, pain and redness.  Respiratory: Negative for cough, shortness of breath and wheezing.   Cardiovascular: Negative for chest pain and palpitations.  Gastrointestinal: Negative for abdominal pain, constipation, diarrhea, nausea and vomiting.  Genitourinary: Negative for dysuria.  Musculoskeletal: Positive for back pain.  Neurological: Negative for dizziness, speech change, focal weakness, seizures and headaches.  Psychiatric/Behavioral: Negative for depression.    DRUG ALLERGIES:   Allergies  Allergen Reactions  . Ace Inhibitors Other (See Comments)    Intolerant    VITALS:  Blood pressure (!) 117/51, pulse 91, temperature (!) 96.5 F (35.8 C), temperature source Axillary, resp. rate 16, height 5\' 8"  (1.727 m), weight 72.6 kg (160 lb), SpO2 99 %.  PHYSICAL EXAMINATION:  Physical Exam  GENERAL:  84 y.o.-year-old elderly patient lying in the bed with no acute distress.  EYES: Pupils equal, round. Right conjunctiva is much erythematous than left. Photosensitive.  No scleral icterus. Extraocular muscles intact.  HEENT: Head atraumatic, normocephalic. Oropharynx and nasopharynx clear.  NECK:  Supple, no jugular venous distention. No thyroid enlargement, no tenderness.  LUNGS: Normal breath sounds bilaterally, no wheezing, rales,rhonchi or crepitation.  No use of accessory muscles of respiration. Decreased breath sounds at the bases CARDIOVASCULAR: S1, S2 normal. No rubs, or gallops. 3/6 systolic murmur present ABDOMEN: Soft, nontender, nondistended. Bowel sounds present. No organomegaly or mass.  EXTREMITIES: No pedal edema, cyanosis, or clubbing.  NEUROLOGIC: Cranial nerves II through XII are intact. Muscle strength 4-5/5 in all extremities. Sensation intact. Gait not checked. Global weakness noted. PSYCHIATRIC: The patient is alert and oriented x 3.  SKIN: No obvious rash, lesion, or ulcer.    LABORATORY PANEL:   CBC Recent Labs  Lab 11/16/17 0418  WBC 8.9  HGB 12.6*  HCT 36.2*  PLT 136*   ------------------------------------------------------------------------------------------------------------------  Chemistries  Recent Labs  Lab 11/13/17 1414  11/17/17 0405  NA 129*   < > 137  K 4.2   < > 4.1  CL 96*   < > 104  CO2 22   < > 22  GLUCOSE 129*   < > 147*  BUN 35*   < > 59*  CREATININE 3.20*   < > 2.35*  CALCIUM 9.7   < > 8.9  AST 46*  --   --   ALT 19  --   --   ALKPHOS 57  --   --   BILITOT 1.1  --   --    < > = values in this interval not displayed.   ------------------------------------------------------------------------------------------------------------------  Cardiac Enzymes No results for input(s): TROPONINI in the last 168 hours. ------------------------------------------------------------------------------------------------------------------  RADIOLOGY:  No results found.  EKG:   Orders placed or performed during the hospital encounter of 06/03/16  . EKG 12-Lead  . EKG 12-Lead  . EKG 12-Lead  . EKG 12-Lead  . EKG  ASSESSMENT AND PLAN:   84 year old male with past medical history significant for A. 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 Altered mental status-likely metabolic encephalopathy from  renal failure. - Also he was on high doses of gabapentin which are on hold currently. -CT of the head negative for any acute findings -No fevers, less likely to be encephalitis but monitor -No focal neurological deficits at this time. -MRI of the brain was done last week at [facility] for visual loss with and without contrast, showed diffuse cerebral atrophy including hippocampal atrophy which can be related to cognitive decline. But no other masses or lesions noted - improved now.  2. Acute renal failure-renal ultrasound is done,  -Avoid nephrotoxins. Receiving IV fluids -Foley catheter placed. No evidence of obstruction. Continue to monitor his urine output -According to his PCP notes from January 2019, patient was also on telmisartan and Lasix at home. Not sure when these were held. -Continue to monitor as has normal renal function at baseline - As per renal ultrasound, no signs of obstruction  - renal func improved some.  3. 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 -Aqueous cultures positive for varicella-zoster virus and had also had acute retinal necrosis. Bilateral foscarnet injections given -ID follow-up today to rule out any systemic infection.  - On admission started on IV acyclovir, but nephrologist and ID agreed on oral valacyclovir due to renal failure. - I had called his [facility] ophthalmologist today to discuss the further care as patient still does not have improvement in his vision, still awaited response from them.  4. Hypertension-on Norvasc and Imdur.  5. Atrial fibrillation-likely secondary to underlying pulmonary hypertension. Rate controlled. Continue eliquis twice a day for anticoagulation. -Dose has been reduced on admission because of his worsened renal function   Physical therapy suggested SNF.   All the records are reviewed and case discussed with Care Management/Social Workerr. Management plans discussed with the  patient, family and they are in agreement.  CODE STATUS: Full Code  TOTAL TIME TAKING CARE OF THIS PATIENT: 35 minutes.   POSSIBLE D/C IN 2-3 DAYS, DEPENDING ON CLINICAL CONDITION.   Vaughan Basta M.D on 11/17/2017 at 4:33 PM  Between 7am to 6pm - Pager - [phone]  After 6pm go to [email or web address] - password [email or web address] [facility]  Office  [phone]  CC: Primary care physician; Baxter Hire, MD

## 2017-11-18 LAB — BASIC METABOLIC PANEL
ANION GAP: 9 (ref 5–15)
BUN: 50 mg/dL — ABNORMAL HIGH (ref 6–20)
CALCIUM: 8.4 mg/dL — AB (ref 8.9–10.3)
CO2: 22 mmol/L (ref 22–32)
Chloride: 102 mmol/L (ref 101–111)
Creatinine, Ser: 1.75 mg/dL — ABNORMAL HIGH (ref 0.61–1.24)
GFR calc Af Amer: 38 mL/min — ABNORMAL LOW (ref >60)
GFR, EST NON AFRICAN AMERICAN: 33 mL/min — AB (ref >60)
GLUCOSE: 132 mg/dL — AB (ref 65–99)
POTASSIUM: 4 mmol/L (ref 3.5–5.1)
SODIUM: 133 mmol/L — AB (ref 135–145)

## 2017-11-18 LAB — CBC
HEMATOCRIT: 38.3 % — AB (ref 40.0–52.0)
HEMOGLOBIN: 13.3 g/dL (ref 13.0–18.0)
MCH: 34.2 pg — ABNORMAL HIGH (ref 26.0–34.0)
MCHC: 34.7 g/dL (ref 32.0–36.0)
MCV: 98.5 fL (ref 80.0–100.0)
Platelets: 169 10*3/uL (ref 150–440)
RBC: 3.89 MIL/uL — ABNORMAL LOW (ref 4.40–5.90)
RDW: 13.4 % (ref 11.5–14.5)
WBC: 7.2 10*3/uL (ref 3.8–10.6)

## 2017-11-18 NOTE — Progress Notes (Signed)
Tele sitter discontinued at 1058

## 2017-11-18 NOTE — Progress Notes (Signed)
Dr. Iona Beard from [facility] [place]- called me. He knows about the pt well. He had suggested to cont valcyclovir ( renally adjusted) and oral prednisone 60 mg daily- on d/c until  Pt see them in office, likely next week.

## 2017-11-18 NOTE — Plan of Care (Signed)
Pt is progressing. Ambulates to bathroom. Had BM today.

## 2017-11-18 NOTE — Progress Notes (Signed)
[place] at [place] NAME: Darryl Novak    MR#:  [id number]  DATE OF BIRTH:  12/16/1925  SUBJECTIVE:  CHIEF COMPLAINT:   Chief Complaint  Patient presents with  . Herpes Zoster  . Hallucinations  . Loss of Vision   - complains of right eye pain and visual loss. Sitting in chair and comfortable. - patient is more alert and comfortable today. Renal function improved.   Patient had some confusion and agitation last night so started on tele sitter.  REVIEW OF SYSTEMS:  Review of Systems  Constitutional: Negative for chills, fever and malaise/fatigue.  HENT: Negative for congestion, ear discharge, hearing loss and nosebleeds.   Eyes: Positive for blurred vision, pain and redness.  Respiratory: Negative for cough, shortness of breath and wheezing.   Cardiovascular: Negative for chest pain and palpitations.  Gastrointestinal: Negative for abdominal pain, constipation, diarrhea, nausea and vomiting.  Genitourinary: Negative for dysuria.  Musculoskeletal: Positive for back pain.  Neurological: Negative for dizziness, speech change, focal weakness, seizures and headaches.  Psychiatric/Behavioral: Negative for depression.    DRUG ALLERGIES:   Allergies  Allergen Reactions  . Ace Inhibitors Other (See Comments)    Intolerant    VITALS:  Blood pressure (!) 99/58, pulse 95, temperature 98 F (36.7 C), temperature source Oral, resp. rate 18, height 5\' 8"  (1.727 m), weight 72.6 kg (160 lb), SpO2 97 %.  PHYSICAL EXAMINATION:  Physical Exam  GENERAL:  84 y.o.-year-old elderly patient lying in the bed with no acute distress.  EYES: Pupils equal, round. Right conjunctiva is much erythematous than left. Photosensitive.  No scleral icterus. Extraocular muscles intact.  HEENT: Head atraumatic, normocephalic. Oropharynx and nasopharynx clear.  NECK:  Supple, no jugular venous distention. No thyroid enlargement, no tenderness.  LUNGS: Normal  breath sounds bilaterally, no wheezing, rales,rhonchi or crepitation. No use of accessory muscles of respiration. Decreased breath sounds at the bases CARDIOVASCULAR: S1, S2 normal. No rubs, or gallops. 3/6 systolic murmur present ABDOMEN: Soft, nontender, nondistended. Bowel sounds present. No organomegaly or mass.  EXTREMITIES: No pedal edema, cyanosis, or clubbing.  NEUROLOGIC: Cranial nerves II through XII are intact. Muscle strength 4-5/5 in all extremities. Sensation intact. Gait not checked. Global weakness noted. PSYCHIATRIC: The patient is alert and oriented x 3.  SKIN: No obvious rash, lesion, or ulcer.    LABORATORY PANEL:   CBC Recent Labs  Lab 11/18/17 0552  WBC 7.2  HGB 13.3  HCT 38.3*  PLT 169   ------------------------------------------------------------------------------------------------------------------  Chemistries  Recent Labs  Lab 11/13/17 1414  11/18/17 0552  NA 129*   < > 133*  K 4.2   < > 4.0  CL 96*   < > 102  CO2 22   < > 22  GLUCOSE 129*   < > 132*  BUN 35*   < > 50*  CREATININE 3.20*   < > 1.75*  CALCIUM 9.7   < > 8.4*  AST 46*  --   --   ALT 19  --   --   ALKPHOS 57  --   --   BILITOT 1.1  --   --    < > = values in this interval not displayed.   ------------------------------------------------------------------------------------------------------------------  Cardiac Enzymes No results for input(s): TROPONINI in the last 168 hours. ------------------------------------------------------------------------------------------------------------------  RADIOLOGY:  No results found.  EKG:   Orders placed or performed during the hospital encounter of 06/03/16  . EKG 12-Lead  .  EKG 12-Lead  . EKG 12-Lead  . EKG 12-Lead  . EKG    ASSESSMENT AND PLAN:   84 year old male with past medical history significant for A. 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 Altered mental status-likely metabolic encephalopathy from renal failure. - Also he was on high doses of gabapentin which are on hold currently. -CT of the head negative for any acute findings -No fevers, less likely to be encephalitis but monitor -No focal neurological deficits at this time. -MRI of the brain was done last week at [facility] for visual loss with and without contrast, showed diffuse cerebral atrophy including hippocampal atrophy which can be related to cognitive decline. But no other masses or lesions noted - improved now.  2. Acute renal failure-renal ultrasound is done,  -Avoid nephrotoxins. Receiving IV fluids -Foley catheter placed. No evidence of obstruction. Continue to monitor his urine output -According to his PCP notes from January 2019, patient was also on telmisartan and Lasix at home. Not sure when these were held. -Continue to monitor as has normal renal function at baseline - As per renal ultrasound, no signs of obstruction  - renal func improved .  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 -Aqueous cultures positive for varicella-zoster virus and had also had acute retinal necrosis. Bilateral foscarnet injections given -ID follow-up today to rule out any systemic infection.  - On admission started on IV acyclovir, but nephrologist and ID agreed on oral valacyclovir due to renal failure. - I had called his [facility] ophthalmologist today to discuss the further care as patient still does not have improvement in his vision, still awaited response from them. - I will advise to follow with ophthalmology clinic in 1-2 weeks.  4. Hypertension-on Norvasc and Imdur.  5. Atrial fibrillation-likely secondary to underlying pulmonary hypertension. Rate controlled. Continue eliquis twice a day for anticoagulation. -Dose has been reduced on admission because of his worsened renal function  Physical therapy  suggested SNF. For agitation last night, he had a Oncologist, now comfortable, d/c sitter and monitor.   All the records are reviewed and case discussed with Care Management/Social Workerr. Management plans discussed with the patient, family and they are in agreement.  CODE STATUS: Full Code  TOTAL TIME TAKING CARE OF THIS PATIENT: 35 minutes.   POSSIBLE D/C IN 2-3 DAYS, DEPENDING ON CLINICAL CONDITION.   Vaughan Basta M.D on 11/18/2017 at 12:58 PM  Between 7am to 6pm - Pager - [phone]  After 6pm go to [email or web address] - password [email or web address] [facility]  Office  [phone]  CC: Primary care physician; Baxter Hire, MD

## 2017-11-18 NOTE — Progress Notes (Signed)
Occupational Therapy Treatment Patient Details Name: Darryl Novak MRN: [id number] DOB: 1926-05-06 Today's Date: 11/18/2017    History of present illness Pt is an 84 y.o. male presenting to hospital 11/13/17 with confusion, hallucinations, and unsteady gait; also with decreased vision L eye about 1 week prior and decreased R vision several days later (seen by ophthalmology and found to be (+) with varicella zoster).  Pt admitted to hospital with AMS (metabolic encephalopathy secondary acute kidney injury and panuveitis).  PMH includes htn, h/o MI, CVA 1984, shingles, neuropathy, OSA, chronic a-fib.   OT comments  Pt seen for OT treatment this date. Session focused on providing low vision compensatory and adaptive strategies for ADL tasks. Set up and instruction provided to pt in basic set up of tray beside bed with phone, water w/ lid and straw, and tissues to improve pt's access to his environment and independence. Pt able to reach and identify each object correctly through touch in order to blow his nose, take a sip of water, and pick up phone. Pt encouraged to request the tray be put back with same set up if moved by family/staff. Pt verbalized understanding. Pt notes not much change to his vision since initial evaluation. Still able to see blurry colors through R eye. Will continue to progress.    Follow Up Recommendations  SNF    Equipment Recommendations       Recommendations for Other Services      Precautions / Restrictions Precautions Precautions: Fall Restrictions Weight Bearing Restrictions: No       Mobility Bed Mobility Overal bed mobility: Needs Assistance Bed Mobility: Rolling Rolling: Min assist         General bed mobility comments: pt requested assist to adjust hips more towards R side to improve comfort  Transfers                      Balance                                           ADL either performed or assessed with  clinical judgement   ADL Overall ADL's : Needs assistance/impaired Eating/Feeding: Bed level;Set up;Cueing for compensatory techinques Eating/Feeding Details (indicate cue type and reason): Set up and instruction provided to pt in basic set up of tray beside bed with phone, water w/ lid and straw, and tissues to improve pt's access to his environment and independence. Pt able to reach and identify each object correctly through touch in order to blow his nose, take a sip of water, and pick up phone. Pt encouraged to request the tray be put back with same set up if moved by family/staff. Pt verbalized understanding.                                          Vision   Additional Comments: Pt notes very little improvement if any in vision, able to see blurry colors through R eye   Perception     Praxis      Cognition Arousal/Alertness: Awake/alert Behavior During Therapy: WFL for tasks assessed/performed Overall Cognitive Status: Within Functional Limits for tasks assessed  Exercises     Shoulder Instructions       General Comments      Pertinent Vitals/ Pain       Pain Assessment: No/denies pain  Home Living                                          Prior Functioning/Environment              Frequency  Min 2X/week        Progress Toward Goals  OT Goals(current goals can now be found in the care plan section)  Progress towards OT goals: Progressing toward goals  Acute Rehab OT Goals Patient Stated Goal: be more independent with low vision OT Goal Formulation: With patient/family Time For Goal Achievement: 11/29/17 Potential to Achieve Goals: Good  Plan Discharge plan remains appropriate;Frequency remains appropriate    Co-evaluation                 AM-PAC PT "6 Clicks" Daily Activity     Outcome Measure   Help from another person eating meals?: A Little Help  from another person taking care of personal grooming?: A Little Help from another person toileting, which includes using toliet, bedpan, or urinal?: A Little Help from another person bathing (including washing, rinsing, drying)?: A Lot Help from another person to put on and taking off regular upper body clothing?: A Little Help from another person to put on and taking off regular lower body clothing?: A Lot 6 Click Score: 16    End of Session    OT Visit Diagnosis: Other abnormalities of gait and mobility (R26.89);Low vision, both eyes (H54.2);Feeding difficulties (R63.3);Muscle weakness (generalized) (M62.81);History of falling (Z91.81)   Activity Tolerance Patient tolerated treatment well   Patient Left in bed;with call bell/phone within reach;with bed alarm set   Nurse Communication          Time: 7939-0300 OT Time Calculation (min): 12 min  Charges: OT General Charges $OT Visit: 1 Visit OT Treatments $Self Care/Home Management : 8-22 mins  Jeni Salles, MPH, MS, OTR/L ascom [phone] 11/18/17, 4:57 PM

## 2017-11-18 NOTE — Clinical Social Work Note (Signed)
CSW informed by Jospeh at [facility] that they no longer have a bed to offer patient. The only bed offers for patient are from [facility] and [facility] and CSW extended both to patient's wife and son today. They have chosen [facility]. [facility] informed that patient should be ready to discharge tomorrow once he has gone 24 hours without a Corporate investment banker. Shela Leff MSW,LCSW [phone]

## 2017-11-19 ENCOUNTER — Encounter: Payer: Self-pay | Admitting: Infectious Diseases

## 2017-11-19 DIAGNOSIS — B0239 Other herpes zoster eye disease: Secondary | ICD-10-CM | POA: Insufficient documentation

## 2017-11-19 DIAGNOSIS — H30139 Disseminated chorioretinal inflammation, generalized, unspecified eye: Secondary | ICD-10-CM

## 2017-11-19 LAB — BASIC METABOLIC PANEL
Anion gap: 10 (ref 5–15)
BUN: 42 mg/dL — ABNORMAL HIGH (ref 6–20)
CALCIUM: 8.5 mg/dL — AB (ref 8.9–10.3)
CO2: 26 mmol/L (ref 22–32)
CREATININE: 1.3 mg/dL — AB (ref 0.61–1.24)
Chloride: 98 mmol/L — ABNORMAL LOW (ref 101–111)
GFR calc non Af Amer: 47 mL/min — ABNORMAL LOW (ref >60)
GFR, EST AFRICAN AMERICAN: 54 mL/min — AB (ref >60)
Glucose, Bld: 117 mg/dL — ABNORMAL HIGH (ref 65–99)
Potassium: 3.4 mmol/L — ABNORMAL LOW (ref 3.5–5.1)
SODIUM: 134 mmol/L — AB (ref 135–145)

## 2017-11-19 MED ORDER — POLYETHYLENE GLYCOL 3350 17 G PO PACK: 17.0000 g | PACK | Freq: Every day | ORAL | 0 refills | Status: DC

## 2017-11-19 MED ORDER — VALACYCLOVIR HCL 1 G PO TABS: 1000.0000 mg | ORAL_TABLET | Freq: Two times a day (BID) | ORAL | 0 refills | Status: AC

## 2017-11-19 MED ORDER — ENSURE ENLIVE PO LIQD: 237.0000 mL | Freq: Three times a day (TID) | ORAL | 12 refills | Status: DC

## 2017-11-19 MED ORDER — PREDNISONE 20 MG PO TABS: 60.0000 mg | ORAL_TABLET | Freq: Every day | ORAL | 0 refills | Status: DC

## 2017-11-19 NOTE — Progress Notes (Signed)
Called report to [facility]. Report given to jennifer clapp LPN.

## 2017-11-19 NOTE — Progress Notes (Signed)
Darryl  Novak and O x 4. VSS. Pt tolerating diet well. No complaints of pain or nausea. IV removed intact, prescriptions given to family. Pt is blind. Family voiced understanding of discharge instructions with no further questions. Pt discharged via EMS to [facility].     Allergies as of 11/19/2017      Reactions   Ace Inhibitors Other (See Comments)   Intolerant      Medication List    STOP taking these medications   furosemide 20 MG tablet Commonly known as:  LASIX   gabapentin 600 MG tablet Commonly known as:  NEURONTIN     TAKE these medications   amLODipine 5 MG tablet Commonly known as:  NORVASC Take 5 mg by mouth daily.   cholecalciferol 400 units Tabs tablet Commonly known as:  VITAMIN D Take 400 Units by mouth daily.   Co Q-10 100 MG Caps Take 100 mg by mouth daily.   ELIQUIS 5 MG Tabs tablet Generic drug:  apixaban Take 5 mg by mouth 2 (two) times daily.   feeding supplement (ENSURE ENLIVE) Liqd Take 237 mLs by mouth 3 (three) times daily between meals.   isosorbide mononitrate 30 MG 24 hr tablet Commonly known as:  IMDUR Take 30 mg by mouth daily.   KLOR-CON M10 10 MEQ tablet Generic drug:  potassium chloride Take 10 mEq by mouth daily.   polyethylene glycol packet Commonly known as:  MIRALAX / GLYCOLAX Take 17 g by mouth daily.   predniSONE 20 MG tablet Commonly known as:  DELTASONE Take 3 tablets (60 mg total) by mouth daily.   valACYclovir 1000 MG tablet Commonly known as:  VALTREX Take 1 tablet (1,000 mg total) by mouth 2 (two) times daily for 15 days. What changed:    medication strength  how much to take  when to take this       Vitals:   11/19/17 1141 11/19/17 1325  BP: 138/70 115/64  Pulse: 94 100  Resp: 16 18  Temp: (!) 97.5 F (36.4 C) 98.1 F (36.7 C)  SpO2: 97% 97%    Francesco Sor

## 2017-11-19 NOTE — Discharge Summary (Signed)
[facility] NAME: Darryl Novak    MR#:  [id number]  DATE OF BIRTH:  December 27, 1925  DATE OF ADMISSION:  11/13/2017 ADMITTING PHYSICIAN: Henreitta Leber, MD  DATE OF DISCHARGE: 11/19/2017  PRIMARY CARE PHYSICIAN: Baxter Hire, MD    ADMISSION DIAGNOSIS:  Urinary retention [R33.9] Hallucination [R44.3] Acute renal failure, unspecified acute renal failure type ([place]) [N17.9] Altered mental status, unspecified altered mental status type [R41.82]  DISCHARGE DIAGNOSIS:  Active Problems:   Acute renal failure (ARF) ([place])   SECONDARY DIAGNOSIS:   Past Medical History:  Diagnosis Date  . Heart trouble   . Hypertension   . MI (myocardial infarction) ([place]) 1978  . Neuropathy   . Shingles   . Stroke ([place])   . Ulcer     HOSPITAL COURSE:   84 year old male with past medical history significant for A. 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 Altered mental status-likely metabolic encephalopathy from renal failure. - Also he was on high doses of gabapentin which are on hold currently. -CT of the head negative for any acute findings -No fevers, less likely to be encephalitis but monitor -No focal neurological deficits at this time. -MRI of the brain was done last week at [facility] for visual loss with and without contrast, showed diffuse cerebral atrophy including hippocampal atrophy which can be related to cognitive decline. But no other masses or lesions noted - improved now.  2. Acute renal failure-renal ultrasound is done,  -Avoid nephrotoxins. Receiving IV fluids -Foley catheter placed. No evidence of obstruction. Continue to monitor his urine output -According to his PCP notes from January 2019, patient was also on telmisartan and Lasix at home. Not sure when these were held. -Continue to monitor as has normal renal function at  baseline - As per renal ultrasound, no signs of obstruction  - renal func improved . Creatinin improved from 3.3 to 1.3  3. 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 -Aqueous cultures positive for varicella-zoster virus and had also had acute retinal necrosis. Bilateral foscarnet injections given -ID follow-up today to rule out any systemic infection.  - On admission started on IV acyclovir, but nephrologist and ID agreed on oral valacyclovir due to renal failure. - I had called his [facility] ophthalmologist today to discuss the further care as patient still does not have improvement in his vision, still awaited response from them. - I will advise to follow with ophthalmology clinic in 1 weeks. - Follow with Ophthalmology clinic in 3-4 days.  4. Hypertension-on Norvasc and Imdur.  5. Atrial fibrillation-likely secondary to underlying pulmonary hypertension. Rate controlled. Continue eliquis twice a day for anticoagulation. -Dose has been reduced on admission because of his worsened renal function    DISCHARGE CONDITIONS:   Stable.  CONSULTS OBTAINED:  Treatment Team:  Leonel Ramsay, MD Anthonette Legato, MD  DRUG ALLERGIES:   Allergies  Allergen Reactions  . Ace Inhibitors Other (See Comments)    Intolerant    DISCHARGE MEDICATIONS:   Allergies as of 11/19/2017      Reactions   Ace Inhibitors Other (See Comments)   Intolerant      Medication List    STOP taking these medications   furosemide 20 MG tablet Commonly known as:  LASIX   gabapentin 600 MG tablet Commonly known as:  NEURONTIN     TAKE  these medications   amLODipine 5 MG tablet Commonly known as:  NORVASC Take 5 mg by mouth daily.   cholecalciferol 400 units Tabs tablet Commonly known as:  VITAMIN D Take 400 Units by mouth daily.   Co Q-10 100 MG Caps Take 100 mg by mouth daily.   ELIQUIS 5 MG Tabs tablet Generic drug:  apixaban Take 5 mg  by mouth 2 (two) times daily.   feeding supplement (ENSURE ENLIVE) Liqd Take 237 mLs by mouth 3 (three) times daily between meals.   isosorbide mononitrate 30 MG 24 hr tablet Commonly known as:  IMDUR Take 30 mg by mouth daily.   KLOR-CON M10 10 MEQ tablet Generic drug:  potassium chloride Take 10 mEq by mouth daily.   polyethylene glycol packet Commonly known as:  MIRALAX / GLYCOLAX Take 17 g by mouth daily.   predniSONE 20 MG tablet Commonly known as:  DELTASONE Take 3 tablets (60 mg total) by mouth daily.   valACYclovir 1000 MG tablet Commonly known as:  VALTREX Take 1 tablet (1,000 mg total) by mouth 2 (two) times daily for 15 days. What changed:    medication strength  how much to take  when to take this        DISCHARGE INSTRUCTIONS:    Follow with Ophthalmology clinic.  If you experience worsening of your admission symptoms, develop shortness of breath, life threatening emergency, suicidal or homicidal thoughts you must seek medical attention immediately by calling 911 or calling your MD immediately  if symptoms less severe.  You Must read complete instructions/literature along with all the possible adverse reactions/side effects for all the Medicines you take and that have been prescribed to you. Take any new Medicines after you have completely understood and accept all the possible adverse reactions/side effects.   Please note  You were cared for by a hospitalist during your hospital stay. If you have any questions about your discharge medications or the care you received while you were in the hospital after you are discharged, you can call the unit and asked to speak with the hospitalist on call if the hospitalist that took care of you is not available. Once you are discharged, your primary care physician will handle any further medical issues. Please note that NO REFILLS for any discharge medications will be authorized once you are discharged, as it is  imperative that you return to your primary care physician (or establish a relationship with a primary care physician if you do not have one) for your aftercare needs so that they can reassess your need for medications and monitor your lab values.    Today   CHIEF COMPLAINT:   Chief Complaint  Patient presents with  . Herpes Zoster  . Hallucinations  . Loss of Vision    HISTORY OF PRESENT ILLNESS:  Darryl Novak  is a 84 y.o. male with a known history of hypertension, neuropathy, history of previous CVA, previous history of MI who presents to the hospital due to altered mental status. Patient about 2 weeks ago developed acute vision loss to his left eye. He was seen by Dr. Wallace Going here at the [facility] and then referred to [facility]. 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 Patient was started on high-dose Valtrex. Patient has been on high-dose Valtrex for a few days but is not tolerating it well and has he developed some nausea and  vomiting with it. This morning the patient's son noticed that the patient was hallucinating and seeing and hearing things that were not there. He also has lost some balance and was having some gait instability and ambulating. He was therefore brought to the ER for further evaluation. In the emergency room patient underwent routine blood work which showed that she was in acute kidney injury with a creatinine of 3. Patient was also noted to be dehydrated. The ER physician spoke to ophthalmology at [facility] and the recommended renally dosing his antivirals and hydrating with IV fluids. There was a possible concern for disseminated zoster but this is less likely and patient is clinically afebrile and hemodynamically stable and not showing any signs of overt encephalitis. Hospitalist services were contacted further treatment evaluation.   VITAL  SIGNS:  Blood pressure (!) 127/57, pulse 93, temperature (!) 97.1 F (36.2 C), resp. rate 16, height 5\' 8"  (1.727 m), weight 72.6 kg (160 lb), SpO2 95 %.  I/O:    Intake/Output Summary (Last 24 hours) at 11/19/2017 1023 Last data filed at 11/19/2017 8756 Gross per 24 hour  Intake 2349 ml  Output 3675 ml  Net -1326 ml    PHYSICAL EXAMINATION:   GENERAL:  84 y.o.-year-old elderly patient lying in the bed with no acute distress.  EYES: Pupils equal, round. Right conjunctiva is much erythematous than left. Photosensitive.  No scleral icterus. Extraocular muscles intact.  HEENT: Head atraumatic, normocephalic. Oropharynx and nasopharynx clear.  NECK:  Supple, no jugular venous distention. No thyroid enlargement, no tenderness.  LUNGS: Normal breath sounds bilaterally, no wheezing, rales,rhonchi or crepitation. No use of accessory muscles of respiration. Decreased breath sounds at the bases CARDIOVASCULAR: S1, S2 normal. No rubs, or gallops. 3/6 systolic murmur present ABDOMEN: Soft, nontender, nondistended. Bowel sounds present. No organomegaly or mass.  EXTREMITIES: No pedal edema, cyanosis, or clubbing.  NEUROLOGIC: Cranial nerves II through XII are intact. Muscle strength 4-5/5 in all extremities. Sensation intact. Gait not checked. Global weakness noted. PSYCHIATRIC: The patient is alert and oriented x 3.  SKIN: No obvious rash, lesion, or ulcer.     DATA REVIEW:   CBC Recent Labs  Lab 11/18/17 0552  WBC 7.2  HGB 13.3  HCT 38.3*  PLT 169    Chemistries  Recent Labs  Lab 11/13/17 1414  11/19/17 0417  NA 129*   < > 134*  K 4.2   < > 3.4*  CL 96*   < > 98*  CO2 22   < > 26  GLUCOSE 129*   < > 117*  BUN 35*   < > 42*  CREATININE 3.20*   < > 1.30*  CALCIUM 9.7   < > 8.5*  AST 46*  --   --   ALT 19  --   --   ALKPHOS 57  --   --   BILITOT 1.1  --   --    < > = values in this interval not displayed.    Cardiac Enzymes No results for input(s): TROPONINI in the  last 168 hours.  Microbiology Results  No results found for this or any previous visit.  RADIOLOGY:  No results found.  EKG:   Orders placed or performed during the hospital encounter of 06/03/16  . EKG 12-Lead  . EKG 12-Lead  . EKG 12-Lead  . EKG 12-Lead  . EKG      Management plans discussed with the patient, family and they are in agreement.  CODE STATUS:  Code Status Orders  (From admission, onward)        Start     Ordered   11/13/17 2025  Full code  Continuous     11/13/17 2025    Code Status History    Date Active Date Inactive Code Status Order ID Comments User Context   This patient has a current code status but no historical code status.      TOTAL TIME TAKING CARE OF THIS PATIENT: 35 minutes.    Vaughan Basta M.D on 11/19/2017 at 10:23 AM  Between 7am to 6pm - Pager - [phone]  After 6pm go to [email or web address] - password [email or web address] [facility]  Office  [phone]  CC: Primary care physician; Baxter Hire, MD   Note: This dictation was prepared with Dragon dictation along with smaller phrase technology. Any transcriptional errors that result from this process are unintentional.

## 2017-11-19 NOTE — Clinical Social Work Placement (Signed)
   CLINICAL SOCIAL WORK PLACEMENT  NOTE  Date:  11/19/2017  Patient Details  Name: Darryl Novak MRN: [id number] Date of Birth: Aug 26, 1926  Clinical Social Work is seeking post-discharge placement for this patient at the [place] level of care (*CSW will initial, date and re-position this form in  chart as items are completed):  Yes   Patient/family provided with [place] Work Department's list of facilities offering this level of care within the geographic area requested by the patient (or if unable, by the patient's family).  Yes   Patient/family informed of their freedom to choose among providers that offer the needed level of care, that participate in Medicare, Medicaid or managed care program needed by the patient, have an available bed and are willing to accept the patient.  Yes   Patient/family informed of [facility]'s ownership interest in [facility] and [facility], as well as of the fact that they are under no obligation to receive care at these facilities.  PASRR submitted to EDS on 11/16/17     PASRR number received on 11/16/17     Existing PASRR number confirmed on       FL2 transmitted to all facilities in geographic area requested by pt/family on 11/16/17     FL2 transmitted to all facilities within larger geographic area on       Patient informed that his/her managed care company has contracts with or will negotiate with certain facilities, including the following:        Yes   Patient/family informed of bed offers received.  Patient chooses bed at [name])     Physician recommends and patient chooses bed at [name])    Patient to be transferred to [name]) on 11/19/17.  Patient to be transferred to facility by (EMS)     Patient family notified on 11/19/17 of transfer.  Name of family member notified:  Dominica Severin: son)     PHYSICIAN       Additional Comment:     _______________________________________________ Shela Leff, LCSW 11/19/2017, 11:14 AM

## 2017-11-19 NOTE — Clinical Social Work Note (Signed)
Patient is to discharge today to [facility]. Tiffany at [facility] received Josem Kaufmann from [facility]. Discharge information sent to [facility]. CSW spoke with patient's son and he stated he wants patient to go via EMS today but was informed by [facility] that he needs to be there by 4pm. CSW assured patient's son that they prefer their patient's to be there by 4pm, but that they will accept patient after this time.  Shela Leff MSW,LCSW [phone]

## 2017-11-20 ENCOUNTER — Encounter (INDEPENDENT_AMBULATORY_CARE_PROVIDER_SITE_OTHER): Payer: TRICARE For Life (TFL) | Admitting: Vascular Surgery

## 2017-12-15 ENCOUNTER — Encounter: Payer: Self-pay | Admitting: Podiatry

## 2017-12-15 ENCOUNTER — Ambulatory Visit (INDEPENDENT_AMBULATORY_CARE_PROVIDER_SITE_OTHER): Payer: Medicare Other | Admitting: Podiatry

## 2017-12-15 DIAGNOSIS — D689 Coagulation defect, unspecified: Secondary | ICD-10-CM

## 2017-12-15 DIAGNOSIS — B351 Tinea unguium: Secondary | ICD-10-CM

## 2017-12-15 DIAGNOSIS — M79676 Pain in unspecified toe(s): Secondary | ICD-10-CM | POA: Diagnosis not present

## 2017-12-15 NOTE — Progress Notes (Signed)
Complaint:  Visit Type: Patient returns to my office for continued preventative foot care services. Complaint: Patient states" my nails have grown long and thick and become painful to walk and wear shoes". The patient presents for preventative foot care services. No changes to ROS.  Patient is on eliquiss.  Podiatric Exam: Vascular: dorsalis pedis and posterior tibial pulses are weakly  palpable bilateral. Capillary return is immediate. Temperature gradient is WNL. Skin turgor WNL  Sensorium: Normal Semmes Weinstein monofilament test. Normal tactile sensation bilaterally. Nail Exam: Pt has thick disfigured discolored nails with subungual debris noted bilateral entire nail hallux through fifth toenails Ulcer Exam: There is no evidence of ulcer or pre-ulcerative changes or infection. Orthopedic Exam: Muscle tone and strength are WNL. No limitations in general ROM. No crepitus or effusions noted. Foot type and digits show no abnormalities. Bony prominences are unremarkable. Skin: No Porokeratosis. No infection or ulcers  Diagnosis:  Onychomycosis, , Pain in right toe, pain in left toes  Treatment & Plan Procedures and Treatment: Consent by patient was obtained for treatment procedures.   Debridement of mycotic and hypertrophic toenails, 1 through 5 bilateral and clearing of subungual debris. No ulceration, no infection noted. Iatrogenic lesions both feet due to thin skin and blood thinner eliquiss.  Return Visit-Office Procedure: Patient instructed to return to the office for a follow up visit 3 months for continued evaluation and treatment.    Gardiner Barefoot DPM

## 2017-12-27 ENCOUNTER — Encounter: Payer: Self-pay | Admitting: Internal Medicine

## 2017-12-27 ENCOUNTER — Other Ambulatory Visit: Payer: Self-pay

## 2017-12-27 ENCOUNTER — Observation Stay
Admission: EM | Admit: 2017-12-27 | Discharge: 2017-12-28 | Disposition: A | Discharge status: Home / Self Care | Payer: Medicare Other | Attending: Internal Medicine | Admitting: Internal Medicine

## 2017-12-27 ENCOUNTER — Emergency Department: Payer: Medicare Other

## 2017-12-27 DIAGNOSIS — I739 Peripheral vascular disease, unspecified: Secondary | ICD-10-CM | POA: Insufficient documentation

## 2017-12-27 DIAGNOSIS — I10 Essential (primary) hypertension: Secondary | ICD-10-CM | POA: Diagnosis not present

## 2017-12-27 DIAGNOSIS — H30139 Disseminated chorioretinal inflammation, generalized, unspecified eye: Secondary | ICD-10-CM

## 2017-12-27 DIAGNOSIS — B0230 Zoster ocular disease, unspecified: Secondary | ICD-10-CM | POA: Insufficient documentation

## 2017-12-27 DIAGNOSIS — Z87891 Personal history of nicotine dependence: Secondary | ICD-10-CM | POA: Insufficient documentation

## 2017-12-27 DIAGNOSIS — G4733 Obstructive sleep apnea (adult) (pediatric): Secondary | ICD-10-CM | POA: Insufficient documentation

## 2017-12-27 DIAGNOSIS — I081 Rheumatic disorders of both mitral and tricuspid valves: Secondary | ICD-10-CM | POA: Diagnosis not present

## 2017-12-27 DIAGNOSIS — I959 Hypotension, unspecified: Secondary | ICD-10-CM

## 2017-12-27 DIAGNOSIS — Z8673 Personal history of transient ischemic attack (TIA), and cerebral infarction without residual deficits: Secondary | ICD-10-CM | POA: Diagnosis not present

## 2017-12-27 DIAGNOSIS — Z79899 Other long term (current) drug therapy: Secondary | ICD-10-CM | POA: Diagnosis not present

## 2017-12-27 DIAGNOSIS — I2510 Atherosclerotic heart disease of native coronary artery without angina pectoris: Secondary | ICD-10-CM | POA: Diagnosis not present

## 2017-12-27 DIAGNOSIS — E871 Hypo-osmolality and hyponatremia: Secondary | ICD-10-CM | POA: Diagnosis not present

## 2017-12-27 DIAGNOSIS — N179 Acute kidney failure, unspecified: Principal | ICD-10-CM | POA: Insufficient documentation

## 2017-12-27 DIAGNOSIS — S31000A Unspecified open wound of lower back and pelvis without penetration into retroperitoneum, initial encounter: Secondary | ICD-10-CM | POA: Insufficient documentation

## 2017-12-27 DIAGNOSIS — E860 Dehydration: Secondary | ICD-10-CM | POA: Diagnosis not present

## 2017-12-27 DIAGNOSIS — I482 Chronic atrial fibrillation: Secondary | ICD-10-CM | POA: Insufficient documentation

## 2017-12-27 DIAGNOSIS — Z7901 Long term (current) use of anticoagulants: Secondary | ICD-10-CM | POA: Diagnosis not present

## 2017-12-27 DIAGNOSIS — R778 Other specified abnormalities of plasma proteins: Secondary | ICD-10-CM

## 2017-12-27 DIAGNOSIS — X58XXXA Exposure to other specified factors, initial encounter: Secondary | ICD-10-CM | POA: Insufficient documentation

## 2017-12-27 DIAGNOSIS — I252 Old myocardial infarction: Secondary | ICD-10-CM | POA: Insufficient documentation

## 2017-12-27 DIAGNOSIS — R531 Weakness: Secondary | ICD-10-CM

## 2017-12-27 DIAGNOSIS — G629 Polyneuropathy, unspecified: Secondary | ICD-10-CM | POA: Diagnosis not present

## 2017-12-27 DIAGNOSIS — R748 Abnormal levels of other serum enzymes: Secondary | ICD-10-CM | POA: Diagnosis not present

## 2017-12-27 DIAGNOSIS — L8990 Pressure ulcer of unspecified site, unspecified stage: Secondary | ICD-10-CM

## 2017-12-27 DIAGNOSIS — B0239 Other herpes zoster eye disease: Secondary | ICD-10-CM | POA: Diagnosis present

## 2017-12-27 DIAGNOSIS — R7989 Other specified abnormal findings of blood chemistry: Secondary | ICD-10-CM

## 2017-12-27 LAB — CBC WITH DIFFERENTIAL/PLATELET
Basophils Absolute: 0 10*3/uL (ref 0–0.1)
Basophils Relative: 0 %
EOS ABS: 0 10*3/uL (ref 0–0.7)
Eosinophils Relative: 0 %
HEMATOCRIT: 38.3 % — AB (ref 40.0–52.0)
HEMOGLOBIN: 13.2 g/dL (ref 13.0–18.0)
Lymphocytes Relative: 5 %
Lymphs Abs: 0.5 10*3/uL — ABNORMAL LOW (ref 1.0–3.6)
MCH: 35.7 pg — AB (ref 26.0–34.0)
MCHC: 34.5 g/dL (ref 32.0–36.0)
MCV: 103.5 fL — ABNORMAL HIGH (ref 80.0–100.0)
MONOS PCT: 4 %
Monocytes Absolute: 0.4 10*3/uL (ref 0.2–1.0)
NEUTROS ABS: 8.6 10*3/uL — AB (ref 1.4–6.5)
NEUTROS PCT: 91 %
Platelets: 149 10*3/uL — ABNORMAL LOW (ref 150–440)
RBC: 3.71 MIL/uL — ABNORMAL LOW (ref 4.40–5.90)
RDW: 20.1 % — AB (ref 11.5–14.5)
WBC: 9.6 10*3/uL (ref 3.8–10.6)

## 2017-12-27 LAB — COMPREHENSIVE METABOLIC PANEL
ALK PHOS: 51 U/L (ref 38–126)
ALT: 23 U/L (ref 17–63)
ANION GAP: 13 (ref 5–15)
AST: 36 U/L (ref 15–41)
Albumin: 3.1 g/dL — ABNORMAL LOW (ref 3.5–5.0)
BILIRUBIN TOTAL: 1.5 mg/dL — AB (ref 0.3–1.2)
BUN: 17 mg/dL (ref 6–20)
CALCIUM: 8.6 mg/dL — AB (ref 8.9–10.3)
CO2: 24 mmol/L (ref 22–32)
Chloride: 93 mmol/L — ABNORMAL LOW (ref 101–111)
Creatinine, Ser: 1.11 mg/dL (ref 0.61–1.24)
GFR calc non Af Amer: 56 mL/min — ABNORMAL LOW (ref >60)
Glucose, Bld: 111 mg/dL — ABNORMAL HIGH (ref 65–99)
POTASSIUM: 3.7 mmol/L (ref 3.5–5.1)
Sodium: 130 mmol/L — ABNORMAL LOW (ref 135–145)
Total Protein: 5.9 g/dL — ABNORMAL LOW (ref 6.5–8.1)

## 2017-12-27 LAB — URINALYSIS, COMPLETE (UACMP) WITH MICROSCOPIC
BILIRUBIN URINE: NEGATIVE
Bacteria, UA: NONE SEEN
GLUCOSE, UA: NEGATIVE mg/dL
Hgb urine dipstick: NEGATIVE
Ketones, ur: NEGATIVE mg/dL
Leukocytes, UA: NEGATIVE
NITRITE: NEGATIVE
PH: 7 (ref 5.0–8.0)
Protein, ur: NEGATIVE mg/dL
SPECIFIC GRAVITY, URINE: 1.009 (ref 1.005–1.030)
Squamous Epithelial / LPF: NONE SEEN

## 2017-12-27 LAB — TROPONIN I: TROPONIN I: 0.04 ng/mL — AB (ref <0.03)

## 2017-12-27 LAB — LACTIC ACID, PLASMA: LACTIC ACID, VENOUS: 1.2 mmol/L (ref 0.5–1.9)

## 2017-12-27 MED ORDER — BIOTIN 1000 MCG PO TABS: 1000.0000 ug | ORAL_TABLET | Freq: Every day | ORAL | Status: DC

## 2017-12-27 MED ORDER — CHOLECALCIFEROL 10 MCG (400 UNIT) PO TABS
400.0000 [IU] | ORAL_TABLET | Freq: Every day | ORAL | Status: DC
  Administered 2017-12-27 – 2017-12-28 (×2): 400 [IU] via ORAL
  Filled 2017-12-27 (×3): qty 1

## 2017-12-27 MED ORDER — GABAPENTIN 300 MG PO CAPS
600.0000 mg | ORAL_CAPSULE | Freq: Every day | ORAL | Status: DC
  Administered 2017-12-27: 600 mg via ORAL
  Filled 2017-12-27: qty 2

## 2017-12-27 MED ORDER — ONDANSETRON HCL 4 MG/2ML IJ SOLN: 4.0000 mg | Freq: Four times a day (QID) | INTRAMUSCULAR | Status: DC | PRN

## 2017-12-27 MED ORDER — ACETAMINOPHEN 650 MG RE SUPP: 650.0000 mg | Freq: Four times a day (QID) | RECTAL | Status: DC | PRN

## 2017-12-27 MED ORDER — DOCUSATE SODIUM 100 MG PO CAPS
100.0000 mg | ORAL_CAPSULE | Freq: Two times a day (BID) | ORAL | Status: DC
  Administered 2017-12-27 – 2017-12-28 (×2): 100 mg via ORAL
  Filled 2017-12-27 (×2): qty 1

## 2017-12-27 MED ORDER — CO Q-10 100 MG PO CAPS: 100.0000 mg | ORAL_CAPSULE | Freq: Every day | ORAL | Status: DC

## 2017-12-27 MED ORDER — SODIUM CHLORIDE 0.9 % IV SOLN
Freq: Once | INTRAVENOUS | Status: AC
  Administered 2017-12-27: 12:00:00 via INTRAVENOUS

## 2017-12-27 MED ORDER — ONDANSETRON HCL 4 MG/2ML IJ SOLN
4.0000 mg | Freq: Once | INTRAMUSCULAR | Status: AC
  Administered 2017-12-27: 4 mg via INTRAVENOUS
  Filled 2017-12-27: qty 2

## 2017-12-27 MED ORDER — APIXABAN 5 MG PO TABS
5.0000 mg | ORAL_TABLET | Freq: Two times a day (BID) | ORAL | Status: DC
  Administered 2017-12-27 – 2017-12-28 (×2): 5 mg via ORAL
  Filled 2017-12-27 (×2): qty 1

## 2017-12-27 MED ORDER — VALACYCLOVIR HCL 500 MG PO TABS
1000.0000 mg | ORAL_TABLET | Freq: Two times a day (BID) | ORAL | Status: DC
  Administered 2017-12-27 – 2017-12-28 (×2): 1000 mg via ORAL
  Filled 2017-12-27 (×2): qty 2

## 2017-12-27 MED ORDER — POTASSIUM CHLORIDE CRYS ER 10 MEQ PO TBCR
10.0000 meq | EXTENDED_RELEASE_TABLET | Freq: Every day | ORAL | Status: DC
Start: 2017-12-27 — End: 2017-12-28
  Administered 2017-12-27 – 2017-12-28 (×2): 10 meq via ORAL
  Filled 2017-12-27 (×2): qty 1

## 2017-12-27 MED ORDER — ONDANSETRON HCL 4 MG PO TABS: 4.0000 mg | ORAL_TABLET | Freq: Four times a day (QID) | ORAL | Status: DC | PRN

## 2017-12-27 MED ORDER — ADULT MULTIVITAMIN W/MINERALS CH
1.0000 | ORAL_TABLET | Freq: Every day | ORAL | Status: DC
  Administered 2017-12-27 – 2017-12-28 (×2): 1 via ORAL
  Filled 2017-12-27 (×2): qty 1

## 2017-12-27 MED ORDER — METHYLPREDNISOLONE SODIUM SUCC 125 MG IJ SOLR
60.0000 mg | Freq: Two times a day (BID) | INTRAMUSCULAR | Status: DC
  Administered 2017-12-27 – 2017-12-28 (×2): 60 mg via INTRAVENOUS
  Filled 2017-12-27 (×2): qty 2

## 2017-12-27 MED ORDER — LEVOFLOXACIN IN D5W 750 MG/150ML IV SOLN
750.0000 mg | INTRAVENOUS | Status: DC
  Administered 2017-12-27: 750 mg via INTRAVENOUS
  Filled 2017-12-27: qty 150

## 2017-12-27 MED ORDER — ENSURE ENLIVE PO LIQD
237.0000 mL | Freq: Three times a day (TID) | ORAL | Status: DC
  Administered 2017-12-27 – 2017-12-28 (×2): 237 mL via ORAL

## 2017-12-27 MED ORDER — ACETAMINOPHEN 325 MG PO TABS: 650.0000 mg | ORAL_TABLET | Freq: Four times a day (QID) | ORAL | Status: DC | PRN

## 2017-12-27 MED ORDER — HEPARIN SODIUM (PORCINE) 5000 UNIT/ML IJ SOLN: 5000.0000 [IU] | Freq: Three times a day (TID) | INTRAMUSCULAR | Status: DC

## 2017-12-27 MED ORDER — BISACODYL 10 MG RE SUPP
10.0000 mg | Freq: Every day | RECTAL | Status: DC | PRN
  Filled 2017-12-27: qty 1

## 2017-12-27 MED ORDER — SIMVASTATIN 20 MG PO TABS
40.0000 mg | ORAL_TABLET | Freq: Every day | ORAL | Status: DC
  Administered 2017-12-27: 21:00:00 40 mg via ORAL
  Filled 2017-12-27: qty 2

## 2017-12-27 MED ORDER — POLYETHYLENE GLYCOL 3350 17 G PO PACK
17.0000 g | PACK | Freq: Every day | ORAL | Status: DC
  Administered 2017-12-27: 18:00:00 17 g via ORAL
  Filled 2017-12-27 (×2): qty 1

## 2017-12-27 MED ORDER — SODIUM CHLORIDE 0.9 % IV SOLN
INTRAVENOUS | Status: DC
  Administered 2017-12-27 – 2017-12-28 (×3): via INTRAVENOUS

## 2017-12-27 NOTE — Progress Notes (Signed)
Pharmacy Antibiotic Note  Darryl Novak is a 84 y.o. male admitted on 12/27/2017 with wound infection.  Pharmacy has been consulted for levaquin dosing.  Plan: levofloxacin 750mg  iv q48h  Height: 5' 7.5" (171.5 cm) Weight: 151 lb (68.5 kg) IBW/kg (Calculated) : 67.25  Temp (24hrs), Avg:98.4 F (36.9 C), Min:98.4 F (36.9 C), Max:98.4 F (36.9 C)  Recent Labs  Lab 12/27/17 1131  WBC 9.6  CREATININE 1.11    Estimated Creatinine Clearance: 42.1 mL/min (by C-G formula based on SCr of 1.11 mg/dL).    Allergies  Allergen Reactions  . Ace Inhibitors Other (See Comments)    Intolerant    Antimicrobials this admission: Anti-infectives (From admission, onward)   Start     Dose/Rate Route Frequency Ordered Stop   12/27/17 1615  valACYclovir (VALTREX) tablet 1,000 mg     1,000 mg Oral 3 times daily 12/27/17 1603     12/27/17 1615  levofloxacin (LEVAQUIN) IVPB 750 mg     750 mg 100 mL/hr over 90 Minutes Intravenous Every 48 hours 12/27/17 1612         Microbiology results: No results found for this or any previous visit (from the past 240 hour(s)).   Thank you for allowing pharmacy to be a part of this patient's care.  Donna Christen Darryl Novak 12/27/2017 4:12 PM

## 2017-12-27 NOTE — ED Notes (Signed)
Secretary will call for pt consult

## 2017-12-27 NOTE — H&P (Signed)
History and Physical    Darryl Novak JSE:[id number] DOB: 1926-09-19 DOA: 12/27/2017  Referring physician: Dr. Jimmye Norman PCP: Baxter Hire, MD  Specialists: none  Chief Complaint: weakness with fall  HPI: Darryl Novak is a 84 y.o. male has a past medical history significant for CAD, HTN, CVA currently being treated for herpetic eye infection with blindness. On Valtrex and tapering off steroids. Now with progressive weakness and inability to walk. Fell this AM. Hypotensive in ER despite IV fluids. He is now admitted. No fever. Denies CP or SOB. Did have some diarrhea last night and c/o painful area on his buttocks. No N/V  Review of Systems: The patient denies anorexia, fever, weight loss, decreased hearing, hoarseness, chest pain, syncope, dyspnea on exertion, peripheral edema, balance deficits, hemoptysis, abdominal pain, melena, hematochezia, severe indigestion/heartburn, hematuria, incontinence, genital sores,  suspicious skin lesions, transient blindness,  depression, unusual weight change, abnormal bleeding, enlarged lymph nodes, angioedema, and breast masses.   Past Medical History:  Diagnosis Date  . Heart trouble   . Hypertension   . MI (myocardial infarction) ([place]) 1978  . Neuropathy   . Shingles   . Stroke ([place])   . Ulcer    History reviewed. No pertinent surgical history. Social History:  reports that he has quit smoking. His smoking use included cigarettes. He has never used smokeless tobacco. He reports that he drinks alcohol. He reports that he does not use drugs.  Allergies  Allergen Reactions  . Ace Inhibitors Other (See Comments)    Intolerant    Family History  Problem Relation Age of Onset  . Heart attack Father     Prior to Admission medications   Medication Sig Start Date End Date Taking? Authorizing Provider  ALPHA-LIPOIC ACID PO Take 300 mg by mouth daily.   Yes [provider]  amLODipine (NORVASC) 5 MG tablet Take 5 mg by mouth  daily.   Yes [provider]  apixaban (ELIQUIS) 5 MG TABS tablet Take 5 mg by mouth 2 (two) times daily.    Yes [provider]  Biotin 1000 MCG tablet Take 1,000 mcg by mouth daily.   Yes [provider]  cholecalciferol (VITAMIN D) 400 units TABS tablet Take 400 Units by mouth daily.   Yes [provider]  Coenzyme Q10 (CO Q-10) 100 MG CAPS Take 100 mg by mouth daily.    Yes [provider]  feeding supplement, ENSURE ENLIVE, (ENSURE ENLIVE) LIQD Take 237 mLs by mouth 3 (three) times daily between meals. 11/19/17  Yes Vaughan Basta, MD  gabapentin (NEURONTIN) 600 MG tablet Take 600 mg by mouth at bedtime.   Yes [provider]  Multiple Vitamin (MULTIVITAMIN) tablet Take 1 tablet by mouth daily.   Yes [provider]  polyethylene glycol (MIRALAX / GLYCOLAX) packet Take 17 g by mouth daily. 11/19/17  Yes Vaughan Basta, MD  potassium chloride (KLOR-CON M10) 10 MEQ tablet Take 10 mEq by mouth daily.    Yes [provider]  predniSONE (DELTASONE) 20 MG tablet Take 3 tablets (60 mg total) by mouth daily. Patient taking differently: Take 10 mg by mouth daily.  11/19/17  Yes Vaughan Basta, MD  simvastatin (ZOCOR) 40 MG tablet Take 40 mg by mouth at bedtime.   Yes [provider]  telmisartan (MICARDIS) 40 MG tablet Take 40 mg by mouth daily.   Yes [provider]  valACYclovir (VALTREX) 1000 MG tablet Take 1,000 mg by mouth 2 (two) times daily.  Yes [provider]   Physical Exam: Vitals:   12/27/17 1300 12/27/17 1330 12/27/17 1430 12/27/17 1500  BP: (!) 97/56 (!) 82/54 (!) 84/47 (!) 92/48  Pulse: 93  87 87  Resp: 16  19 13   Temp:      TempSrc:      SpO2: 95%  96% 92%  Weight:      Height:         General:  No apparent distress, WDWN, [place]/AT  Eyes: PERRL, EOMI, no scleral icterus, conjunctiva clear  ENT: moist oropharynx without exudate, TM's benign, dentition  fair  Neck: supple, no lymphadenopathy. No bruits or thyromegaly  Cardiovascular: irregularly irregyular without MRG; 2+ peripheral pulses, no JVD, trace peripheral edema  Respiratory: CTA biL, good air movement without wheezing, rhonchi or crackled. Respiratory effort normal  Abdomen: soft, non tender to palpation, positive bowel sounds, no guarding, no rebound  Skin: no rashes. Decubitus ulcer noted  Musculoskeletal: normal bulk and tone, no joint swelling  Psychiatric: normal mood and affect, A&OX3  Neurologic: CN 2-12 grossly intact, Motor strength 5/5 in all 4 groups with symmetric DTR's and non-focal sensory exam  Labs on Admission:  Basic Metabolic Panel: Recent Labs  Lab 12/27/17 1131  NA 130*  K 3.7  CL 93*  CO2 24  GLUCOSE 111*  BUN 17  CREATININE 1.11  CALCIUM 8.6*   Liver Function Tests: Recent Labs  Lab 12/27/17 1131  AST 36  ALT 23  ALKPHOS 51  BILITOT 1.5*  PROT 5.9*  ALBUMIN 3.1*   No results for input(s): LIPASE, AMYLASE in the last 168 hours. No results for input(s): AMMONIA in the last 168 hours. CBC: Recent Labs  Lab 12/27/17 1131  WBC 9.6  NEUTROABS 8.6*  HGB 13.2  HCT 38.3*  MCV 103.5*  PLT 149*   Cardiac Enzymes: Recent Labs  Lab 12/27/17 1131  TROPONINI 0.04*    BNP (last 3 results) No results for input(s): BNP in the last 8760 hours.  ProBNP (last 3 results) No results for input(s): PROBNP in the last 8760 hours.  CBG: No results for input(s): GLUCAP in the last 168 hours.  Radiological Exams on Admission: Dg Chest 2 View  Result Date: 12/27/2017 CLINICAL DATA:  Cough and weakness. EXAM: CHEST - 2 VIEW COMPARISON:  06/02/2016 FINDINGS: Chronic mild elevation of the right hemidiaphragm. Both lungs are clear. Heart and mediastinum are within normal limits. Trachea is midline. No large pleural effusions. No acute bone abnormality. Chronic degenerative changes in both shoulders. IMPRESSION: No active cardiopulmonary  disease. Electronically Signed   By: Markus Daft M.D.   On: 12/27/2017 14:11   Ct Head Wo Contrast  Result Date: 12/27/2017 CLINICAL DATA:  Weakness.  Altered consciousness. EXAM: CT HEAD WITHOUT CONTRAST TECHNIQUE: Contiguous axial images were obtained from the base of the skull through the vertex without intravenous contrast. COMPARISON:  None. FINDINGS: Brain: Expected cerebral and cerebellar volume loss for age. Moderate to marked low density in the periventricular white matter likely related to small vessel disease. Vascular: Intracranial atherosclerosis. Skull: Normal Sinuses/Orbits: Fluid within the left maxillary sinus. Surgical changes of both globes. Clear mastoid air cells. Other: None. IMPRESSION: 1.  No acute intracranial abnormality. 2.  Cerebral atrophy and small vessel ischemic change. 3. Sinus disease. Electronically Signed   By: Abigail Miyamoto M.D.   On: 12/27/2017 13:59    EKG: Independently reviewed.  Assessment/Plan Principal Problem:   Weakness generalized Active Problems:   Herpes zoster acute retinal necrosis  Hypotension   Decubital ulcer   Will admit to floor with IV steroids and IV fluids for possible adrenal insufficiency. Check Cortisol level. Continue po Valtrex. Follow enzymes. Echo and Cardiology consult ordered. Ophthalmology consult ordered. Consult PT and CM. Repeat labs in AM  Diet: regular Fluids: NS@100  DVT Prophylaxis: SQ Heparin  Code Status: FULL  Family Communication: yes  Disposition Plan: TBD  Time spent: 50 min

## 2017-12-27 NOTE — Progress Notes (Signed)
PT Cancellation Note  Patient Details Name: Darryl Novak MRN: [id number] DOB: 07-Oct-1925   Cancelled Treatment:    Reason Eval/Treat Not Completed: Other (comment). Received call from ED to evaluate pt to assess for discharge home. Chart reviewed and noted BP at 92/48. Evaluation attempted, however lab and MD with pt. History obtained and most recent BP read 84/56. Not safe or indicated for full PT assessment as mobility contraindicated. Per MD, plan to admit pt at this time and ok with hold evaluation until next date, pending improved vitals. Will hold at this time.   Prabhnoor Ellenberger 12/27/2017, 4:10 PM  Greggory Stallion, PT, DPT [phone]

## 2017-12-27 NOTE — ED Triage Notes (Signed)
Pt c/o feeling weak, and unable to walk. Also states nausea and diarrhea this am. Nonfebrile here

## 2017-12-27 NOTE — ED Notes (Signed)
2nd bag of 250 bolus given per dr order - hung by Rubin Payor

## 2017-12-27 NOTE — ED Notes (Signed)
Lac and BC drawn at this time.

## 2017-12-27 NOTE — ED Notes (Signed)
Spoke with pt - they are with a pt now but will be over shortly to see the pt

## 2017-12-27 NOTE — ED Provider Notes (Signed)
[facility] Emergency Department Provider Note       Time seen: ----------------------------------------- 11:24 AM on 12/27/2017 -----------------------------------------   I have reviewed the triage vital signs and the nursing notes.  HISTORY   Chief Complaint No chief complaint on file.    HPI Darryl Novak is a 84 y.o. male with a history of hypertension, MI, shingles, blindness, CVA and ulcers who presents to the ED for weakness and being unable to walk.  Patient states his legs felt heavy today and he could not walk on them.  Also states he had some nausea and diarrhea this morning.  He was brought in afebrile.  He denies any specific pain at this point.  Past Medical History:  Diagnosis Date  . Heart trouble   . Hypertension   . MI (myocardial infarction) ([place]) 1978  . Neuropathy   . Shingles   . Stroke ([place])   . Ulcer     Patient Active Problem List   Diagnosis Date Noted  . 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 Acute renal failure (ARF) ([place]) 11/13/2017  . Chronic atrial fibrillation ([place]) 04/10/2016  . Obstructive sleep apnea syndrome 10/17/2015  . Cerebrovascular accident, old 09/19/2014  . Arteriosclerosis of coronary artery 03/28/2014  . HLD (hyperlipidemia) 03/28/2014  . BP (high blood pressure) 03/28/2014  . Addison anemia 03/28/2014  . Peripheral vascular disease ([place]) 03/28/2014  . Cerebrovascular accident (CVA) ([place]) 03/28/2014    No past surgical history on file.  Allergies Ace inhibitors  Social History Social History   Tobacco Use  . Smoking status: Former Smoker    Types: Cigarettes  . Smokeless tobacco: Never Used  . Tobacco comment: quit 45 years ago  Substance Use Topics  . Alcohol use: Yes    Comment: 2 drinks a night  . Drug use: No   Review of Systems Constitutional: Negative for fever. Cardiovascular: Negative for chest pain. Respiratory: Negative for shortness of  breath. Gastrointestinal: Negative for abdominal pain, positive for nausea and diarrhea Musculoskeletal: Negative for back pain. Skin: Negative for rash. Neurological: Negative for headaches, positive for leg weakness  All systems negative/normal/unremarkable except as stated in the HPI  ____________________________________________   PHYSICAL EXAM:  VITAL SIGNS: ED Triage Vitals  Enc Vitals Group     BP      Pulse      Resp      Temp      Temp src      SpO2      Weight      Height      Head Circumference      Peak Flow      Pain Score      Pain Loc      Pain Edu?      Excl. in [place]?    Constitutional: Alert and oriented. Well appearing and in no distress. Eyes: Patient is blind ENT   Head: Normocephalic and atraumatic.   Nose: No congestion/rhinnorhea.   Mouth/Throat: Mucous membranes are moist.   Neck: No stridor. Cardiovascular: Normal rate, regular rhythm. No murmurs, rubs, or gallops. Respiratory: Normal respiratory effort without tachypnea nor retractions. Breath sounds are clear and equal bilaterally. No wheezes/rales/rhonchi. Gastrointestinal: Soft and nontender. Normal bowel sounds Musculoskeletal: Nontender with normal range of motion in extremities. No lower extremity tenderness nor edema. Neurologic:  Normal speech and language. No gross focal neurologic deficits are appreciated.  Generalized weakness, nothing focal Skin:  Skin is warm, dry and intact. No rash  noted. Psychiatric: Mood and affect are normal. Speech and behavior are normal.  ____________________________________________  EKG: Interpreted by me.  Atrial fibrillation with a rate of 89 bpm, borderline left axis deviation, normal QT.  ____________________________________________  ED COURSE:  As part of my medical decision making, I reviewed the following data within the [place] History obtained from family if available, nursing notes, old chart and ekg, as well as  notes from prior ED visits. Patient presented for weakness, we will assess with labs and imaging as indicated at this time.   Procedures ____________________________________________   LABS (pertinent positives/negatives)  Labs Reviewed  CBC WITH DIFFERENTIAL/PLATELET - Abnormal; Notable for the following components:      Result Value   RBC 3.71 (*)    HCT 38.3 (*)    MCV 103.5 (*)    MCH 35.7 (*)    RDW 20.1 (*)    Platelets 149 (*)    Neutro Abs 8.6 (*)    Lymphs Abs 0.5 (*)    All other components within normal limits  COMPREHENSIVE METABOLIC PANEL - Abnormal; Notable for the following components:   Sodium 130 (*)    Chloride 93 (*)    Glucose, Bld 111 (*)    Calcium 8.6 (*)    Total Protein 5.9 (*)    Albumin 3.1 (*)    Total Bilirubin 1.5 (*)    GFR calc non Af Amer 56 (*)    All other components within normal limits  TROPONIN I - Abnormal; Notable for the following components:   Troponin I 0.04 (*)    All other components within normal limits  URINALYSIS, COMPLETE (UACMP) WITH MICROSCOPIC - Abnormal; Notable for the following components:   Color, Urine YELLOW (*)    APPearance CLEAR (*)    All other components within normal limits  CULTURE, BLOOD (ROUTINE X 2)  CULTURE, BLOOD (ROUTINE X 2)  LACTIC ACID, PLASMA  LACTIC ACID, PLASMA    RADIOLOGY Images were viewed by me CT head IMPRESSION: 1.  No acute intracranial abnormality. 2.  Cerebral atrophy and small vessel ischemic change. 3. Sinus disease. IMPRESSION: No active cardiopulmonary disease.   ____________________________________________  DIFFERENTIAL DIAGNOSIS   Dehydration, electrolyte abnormality, occult infection, MI, CVA  FINAL ASSESSMENT AND PLAN  Weakness, elevated troponin, hypotension   Plan: The patient had presented for weakness with difficulty walking. Patient's labs were negative with the exception of elevated troponin of 0.04. Patient's imaging did not reveal any specific  etiology including chest x-ray and CT imaging.  We have given him 2 fluid challenges which are transiently effective in bringing his blood pressure up.  I do not know why at this point that he is hypotensive.  We have added blood cultures and a lactic acid.  We have also consulted physical therapy.  I will discussed with the hospitalist for admission.   Laurence Aly, MD   Note: This note was generated in part or whole with voice recognition software. Voice recognition is usually quite accurate but there are transcription errors that can and very often do occur. I apologize for any typographical errors that were not detected and corrected.     Earleen Newport, MD 12/27/17 [phone]

## 2017-12-28 ENCOUNTER — Observation Stay
Admit: 2017-12-28 | Discharge: 2017-12-28 | Disposition: A | Discharge status: Home / Self Care | Payer: Medicare Other | Attending: Internal Medicine | Admitting: Internal Medicine

## 2017-12-28 DIAGNOSIS — N179 Acute kidney failure, unspecified: Secondary | ICD-10-CM | POA: Diagnosis not present

## 2017-12-28 LAB — CBC
HCT: 32.2 % — ABNORMAL LOW (ref 40.0–52.0)
Hemoglobin: 11.3 g/dL — ABNORMAL LOW (ref 13.0–18.0)
MCH: 36 pg — ABNORMAL HIGH (ref 26.0–34.0)
MCHC: 34.9 g/dL (ref 32.0–36.0)
MCV: 103 fL — ABNORMAL HIGH (ref 80.0–100.0)
PLATELETS: 122 10*3/uL — AB (ref 150–440)
RBC: 3.13 MIL/uL — ABNORMAL LOW (ref 4.40–5.90)
RDW: 20.6 % — AB (ref 11.5–14.5)
WBC: 6 10*3/uL (ref 3.8–10.6)

## 2017-12-28 LAB — COMPREHENSIVE METABOLIC PANEL
ALBUMIN: 2.6 g/dL — AB (ref 3.5–5.0)
ALK PHOS: 41 U/L (ref 38–126)
ALT: 18 U/L (ref 17–63)
ANION GAP: 7 (ref 5–15)
AST: 26 U/L (ref 15–41)
BILIRUBIN TOTAL: 1.2 mg/dL (ref 0.3–1.2)
BUN: 19 mg/dL (ref 6–20)
CALCIUM: 7.6 mg/dL — AB (ref 8.9–10.3)
CO2: 23 mmol/L (ref 22–32)
Chloride: 100 mmol/L — ABNORMAL LOW (ref 101–111)
Creatinine, Ser: 1 mg/dL (ref 0.61–1.24)
GFR calc Af Amer: >60 mL/min (ref >60)
GFR calc non Af Amer: >60 mL/min (ref >60)
GLUCOSE: 151 mg/dL — AB (ref 65–99)
Potassium: 4 mmol/L (ref 3.5–5.1)
Sodium: 130 mmol/L — ABNORMAL LOW (ref 135–145)
TOTAL PROTEIN: 4.9 g/dL — AB (ref 6.5–8.1)

## 2017-12-28 LAB — CORTISOL-AM, BLOOD: Cortisol - AM: 7.8 ug/dL (ref 6.7–22.6)

## 2017-12-28 LAB — ECHOCARDIOGRAM COMPLETE
HEIGHTINCHES: 67.5 in
Weight: 2475.2 oz

## 2017-12-28 LAB — GLUCOSE, CAPILLARY: GLUCOSE-CAPILLARY: 135 mg/dL — AB (ref 65–99)

## 2017-12-28 LAB — TROPONIN I: TROPONIN I: 0.03 ng/mL — AB (ref <0.03)

## 2017-12-28 LAB — MAGNESIUM: MAGNESIUM: 1.6 mg/dL — AB (ref 1.7–2.4)

## 2017-12-28 MED ORDER — PREDNISONE 20 MG PO TABS: 10.0000 mg | ORAL_TABLET | Freq: Every day | ORAL | Status: DC

## 2017-12-28 MED ORDER — MAGNESIUM SULFATE 2 GM/50ML IV SOLN
2.0000 g | Freq: Two times a day (BID) | INTRAVENOUS | Status: DC
  Administered 2017-12-28: 2 g via INTRAVENOUS
  Filled 2017-12-28: qty 50

## 2017-12-28 NOTE — Progress Notes (Signed)
Patient discharged with wife and caregiver. IV removed with no complaints. Patient and wife verbalized understanding of education. Patient with no complaints.

## 2017-12-28 NOTE — Discharge Summary (Signed)
[facility] [place] NAME: Darryl Novak    MR#:  [id number]  DATE OF BIRTH:  Jun 17, 1926  DATE OF ADMISSION:  12/27/2017 ADMITTING PHYSICIAN: Idelle Crouch, MD  DATE OF DISCHARGE: 12/28/2017  PRIMARY CARE PHYSICIAN: Baxter Hire, MD    ADMISSION DIAGNOSIS:  Elevated troponin [R74.8] Generalized weakness [R53.1]  DISCHARGE DIAGNOSIS:  Principal Problem:   Weakness generalized Active Problems:   Herpes zoster acute retinal necrosis   Hypotension   Decubital ulcer   SECONDARY DIAGNOSIS:   Past Medical History:  Diagnosis Date  . Heart trouble   . Hypertension   . MI (myocardial infarction) ([place]) 1978  . Neuropathy   . Shingles   . Stroke ([place])   . Ulcer     HOSPITAL COURSE:   84 year old male currently being treated for retinal herpes zoster, sinus and left eye who presents with generalized weakness.   1.  Generalized weakness: This is from acute kidney injury in the setting of dehydration.  2.  Acute kidney injury: This is responded to IV fluids  3.  Hypotension from dehydration which is improved.  We are stopping all blood pressure medications for now.  These may be restarted as an outpatient if needed.  4.  History of herpes zoster with retinal involvement: Continue valacyclovir He will follow-up with his ophthalmologist this week  5.  Hyponatremia: Sodium level is low but has remained stable  6.  Hypomagnesia: This is repleted prior to discharge Of note patient did have asymptomatic 18 beat run of V. tach likely due to low magnesium level  7.  Elevated troponin: Patient has ruled out for ACS. 8. Mild sacral wound : Home health to address since wound care nurse was not present.  Patient would benefit from palliative care outpatient follow-up  DISCHARGE CONDITIONS AND DIET:   Stable for discharge on regular diet  CONSULTS OBTAINED:  Treatment Team:  Fay Records, MD Isaias Cowman, MD  DRUG  ALLERGIES:   Allergies  Allergen Reactions  . Ace Inhibitors Other (See Comments)    Intolerant    DISCHARGE MEDICATIONS:   Allergies as of 12/28/2017      Reactions   Ace Inhibitors Other (See Comments)   Intolerant      Medication List    STOP taking these medications   amLODipine 5 MG tablet Commonly known as:  NORVASC   KLOR-CON M10 10 MEQ tablet Generic drug:  potassium chloride   telmisartan 40 MG tablet Commonly known as:  MICARDIS     TAKE these medications   ALPHA-LIPOIC ACID PO Take 300 mg by mouth daily.   Biotin 1000 MCG tablet Take 1,000 mcg by mouth daily.   cholecalciferol 400 units Tabs tablet Commonly known as:  VITAMIN D Take 400 Units by mouth daily.   Co Q-10 100 MG Caps Take 100 mg by mouth daily.   ELIQUIS 5 MG Tabs tablet Generic drug:  apixaban Take 5 mg by mouth 2 (two) times daily.   feeding supplement (ENSURE ENLIVE) Liqd Take 237 mLs by mouth 3 (three) times daily between meals.   gabapentin 600 MG tablet Commonly known as:  NEURONTIN Take 600 mg by mouth at bedtime.   multivitamin tablet Take 1 tablet by mouth daily.   polyethylene glycol packet Commonly known as:  MIRALAX / GLYCOLAX Take 17 g by mouth daily.   predniSONE 20 MG tablet Commonly known as:  DELTASONE Take 0.5 tablets (10 mg total) by  mouth daily.   simvastatin 40 MG tablet Commonly known as:  ZOCOR Take 40 mg by mouth at bedtime.   VALTREX 1000 MG tablet Generic drug:  valACYclovir Take 1,000 mg by mouth 2 (two) times daily.         Today   CHIEF COMPLAINT:   Patient wants to go home today.   VITAL SIGNS:  Blood pressure (!) 108/59, pulse 80, temperature 97.9 F (36.6 C), temperature source Oral, resp. rate 16, height 5' 7.5" (1.715 m), weight 70.2 kg (154 lb 11.2 oz), SpO2 93 %.   REVIEW OF SYSTEMS:  Review of Systems  Constitutional: Negative.  Negative for chills, fever and malaise/fatigue.  HENT: Negative.  Negative for ear  discharge, ear pain, hearing loss, nosebleeds and sore throat.   Eyes: Negative.  Negative for blurred vision and pain.  Respiratory: Negative.  Negative for cough, hemoptysis, shortness of breath and wheezing.   Cardiovascular: Negative.  Negative for chest pain, palpitations and leg swelling.  Gastrointestinal: Negative.  Negative for abdominal pain, blood in stool, diarrhea, nausea and vomiting.  Genitourinary: Negative.  Negative for dysuria.  Musculoskeletal: Negative.  Negative for back pain.  Skin: Negative.   Neurological: Negative for dizziness, tremors, speech change, focal weakness, seizures and headaches.  Endo/Heme/Allergies: Negative.  Does not bruise/bleed easily.  Psychiatric/Behavioral: Negative.  Negative for depression, hallucinations and suicidal ideas.     PHYSICAL EXAMINATION:  GENERAL:  84 y.o.-year-old patient lying in the bed with no acute distress.  NECK:  Supple, no jugular venous distention. No thyroid enlargement, no tenderness.  LUNGS: Normal breath sounds bilaterally, no wheezing, rales,rhonchi  No use of accessory muscles of respiration.  CARDIOVASCULAR: S1, S2 normal. No murmurs, rubs, or gallops.  ABDOMEN: Soft, non-tender, non-distended. Bowel sounds present. No organomegaly or mass.  EXTREMITIES: No pedal edema, cyanosis, or clubbing.  PSYCHIATRIC: The patient is alert and oriented x 3.  SKIN: No obvious rash, lesion, or ulcer.   DATA REVIEW:   CBC Recent Labs  Lab 12/28/17 0711  WBC 6.0  HGB 11.3*  HCT 32.2*  PLT 122*    Chemistries  Recent Labs  Lab 12/28/17 0711  NA 130*  K 4.0  CL 100*  CO2 23  GLUCOSE 151*  BUN 19  CREATININE 1.00  CALCIUM 7.6*  MG 1.6*  AST 26  ALT 18  ALKPHOS 41  BILITOT 1.2    Cardiac Enzymes Recent Labs  Lab 12/27/17 1131 12/28/17 0711  TROPONINI 0.04* 0.03*    Microbiology Results  @MICRORSLT48 @  RADIOLOGY:  Dg Chest 2 View  Result Date: 12/27/2017 CLINICAL DATA:  Cough and weakness.  EXAM: CHEST - 2 VIEW COMPARISON:  06/02/2016 FINDINGS: Chronic mild elevation of the right hemidiaphragm. Both lungs are clear. Heart and mediastinum are within normal limits. Trachea is midline. No large pleural effusions. No acute bone abnormality. Chronic degenerative changes in both shoulders. IMPRESSION: No active cardiopulmonary disease. Electronically Signed   By: Markus Daft M.D.   On: 12/27/2017 14:11   Ct Head Wo Contrast  Result Date: 12/27/2017 CLINICAL DATA:  Weakness.  Altered consciousness. EXAM: CT HEAD WITHOUT CONTRAST TECHNIQUE: Contiguous axial images were obtained from the base of the skull through the vertex without intravenous contrast. COMPARISON:  None. FINDINGS: Brain: Expected cerebral and cerebellar volume loss for age. Moderate to marked low density in the periventricular white matter likely related to small vessel disease. Vascular: Intracranial atherosclerosis. Skull: Normal Sinuses/Orbits: Fluid within the left maxillary sinus. Surgical changes of both globes.  Clear mastoid air cells. Other: None. IMPRESSION: 1.  No acute intracranial abnormality. 2.  Cerebral atrophy and small vessel ischemic change. 3. Sinus disease. Electronically Signed   By: Abigail Miyamoto M.D.   On: 12/27/2017 13:59      Allergies as of 12/28/2017      Reactions   Ace Inhibitors Other (See Comments)   Intolerant      Medication List    STOP taking these medications   amLODipine 5 MG tablet Commonly known as:  NORVASC   KLOR-CON M10 10 MEQ tablet Generic drug:  potassium chloride   telmisartan 40 MG tablet Commonly known as:  MICARDIS     TAKE these medications   ALPHA-LIPOIC ACID PO Take 300 mg by mouth daily.   Biotin 1000 MCG tablet Take 1,000 mcg by mouth daily.   cholecalciferol 400 units Tabs tablet Commonly known as:  VITAMIN D Take 400 Units by mouth daily.   Co Q-10 100 MG Caps Take 100 mg by mouth daily.   ELIQUIS 5 MG Tabs tablet Generic drug:  apixaban Take 5 mg  by mouth 2 (two) times daily.   feeding supplement (ENSURE ENLIVE) Liqd Take 237 mLs by mouth 3 (three) times daily between meals.   gabapentin 600 MG tablet Commonly known as:  NEURONTIN Take 600 mg by mouth at bedtime.   multivitamin tablet Take 1 tablet by mouth daily.   polyethylene glycol packet Commonly known as:  MIRALAX / GLYCOLAX Take 17 g by mouth daily.   predniSONE 20 MG tablet Commonly known as:  DELTASONE Take 0.5 tablets (10 mg total) by mouth daily.   simvastatin 40 MG tablet Commonly known as:  ZOCOR Take 40 mg by mouth at bedtime.   VALTREX 1000 MG tablet Generic drug:  valACYclovir Take 1,000 mg by mouth 2 (two) times daily.          Management plans discussed with the patient and he is in agreement. Stable for discharge   Patient should follow up with pcp  CODE STATUS:     Code Status Orders  (From admission, onward)        Start     Ordered   12/27/17 1731  Full code  Continuous     12/27/17 1730    Code Status History    Date Active Date Inactive Code Status Order ID Comments User Context   11/13/2017 2025 11/19/2017 1744 Full Code [id number]  Henreitta Leber, MD Inpatient    Advance Directive Documentation     Most Recent Value  Type of Advance Directive  Healthcare Power of Attorney  Pre-existing out of facility DNR order (yellow form or pink MOST form)  -  "MOST" Form in Place?  -      TOTAL TIME TAKING CARE OF THIS PATIENT: 39 minutes.    Note: This dictation was prepared with Dragon dictation along with smaller phrase technology. Any transcriptional errors that result from this process are unintentional.  Lehi Phifer M.D on 12/28/2017 at 12:12 PM  Between 7am to 6pm - Pager - [phone] After 6pm go to [email or web address] - password [email or web address] [facility]  Office  [phone]  CC: Primary care physician; Baxter Hire, MD

## 2017-12-28 NOTE — Evaluation (Signed)
Physical Therapy Evaluation Patient Details Name: Darryl Novak MRN: [id number] DOB: Sep 22, 1926 Today's Date: 12/28/2017   History of Present Illness  Pt admitted for complaints of weakness and fall yesterday. BP very low and reported dizziness. History includes CAD, HTN, CVA, and herpe eye infection, currently being treated.  Clinical Impression  Pt is a pleasant 84 year old male who was admitted for complaints of weakness and fall. Vitals improved this date. Discussed with MD, ok to evaluate prior to cardio consult as she is preparing for discharge this date. Pt performs bed mobility with cga, transfers with mod I, and ambulation with min assist. Further ambulation limited as pt was watching his favorite TV program and wishes to return to watching it. Pt demonstrates deficits with strength/balance/mobility. Pt is close to baseline level at this time. Would benefit from skilled PT to address above deficits and promote optimal return to PLOF. Recommend continue working with HHPT upon discharge from acute hospitalization.       Follow Up Recommendations Home health PT    Equipment Recommendations  None recommended by PT    Recommendations for Other Services       Precautions / Restrictions Precautions Precautions: Fall Restrictions Weight Bearing Restrictions: No      Mobility  Bed Mobility Overal bed mobility: Needs Assistance Bed Mobility: Supine to Sit     Supine to sit: Min guard     General bed mobility comments: needs cues for bringing B LEs across bed and sitting at EOB.  Upon return to supine, needs min assist for positioning  Transfers Overall transfer level: Modified independent Equipment used: Rolling walker (2 wheeled)             General transfer comment: upright posture, slightly unsteady. Preformed standing pregait activities for balance prior to gait trial.  Ambulation/Gait Ambulation/Gait assistance: Min assist Ambulation Distance (Feet): 40  Feet Assistive device: Rolling walker (2 wheeled) Gait Pattern/deviations: Step-through pattern     General Gait Details: ambulated around room with therapist guiding RW and assisting with obstacle avoidance due to vision issues. Able to follow commands well, no LOB noted  Stairs            Wheelchair Mobility    Modified Rankin (Stroke Patients Only)       Balance Overall balance assessment: History of Falls;Needs assistance Sitting-balance support: No upper extremity supported Sitting balance-Leahy Scale: Good     Standing balance support: Bilateral upper extremity supported Standing balance-Leahy Scale: Good                               Pertinent Vitals/Pain Pain Assessment: No/denies pain    Home Living Family/patient expects to be discharged to:: Private residence Living Arrangements: Spouse/significant other Available Help at Discharge: Family;Personal care attendant(provides ADLs assist) Type of Home: House Home Access: Stairs to enter Entrance Stairs-Rails: Right Entrance Stairs-Number of Steps: 1 Home Layout: One level Home Equipment: Grab bars - tub/shower;Walker - 4 wheels;Toilet riser;Wheelchair - [employer] - 2 wheels      Prior Function Level of Independence: Needs assistance         Comments: has been using RW and ambulating with personal care attendent. Receiving HHPT      Hand Dominance        Extremity/Trunk Assessment   Upper Extremity Assessment Upper Extremity Assessment: Overall WFL for tasks assessed    Lower Extremity Assessment Lower Extremity Assessment: Generalized weakness(B LE grossly 4/5)  Communication   Communication: HOH  Cognition Arousal/Alertness: Awake/alert Behavior During Therapy: WFL for tasks assessed/performed Overall Cognitive Status: Within Functional Limits for tasks assessed                                        General Comments      Exercises      Assessment/Plan    PT Assessment Patient needs continued PT services  PT Problem List Decreased strength;Decreased balance;Decreased mobility       PT Treatment Interventions Gait training;Therapeutic activities;Therapeutic exercise;Balance training    PT Goals (Current goals can be found in the Care Plan section)  Acute Rehab PT Goals Patient Stated Goal: to go home today PT Goal Formulation: With patient Time For Goal Achievement: 01/11/18 Potential to Achieve Goals: Good    Frequency Min 2X/week   Barriers to discharge        Co-evaluation               AM-PAC PT "6 Clicks" Daily Activity  Outcome Measure Difficulty turning over in bed (including adjusting bedclothes, sheets and blankets)?: A Little Difficulty moving from lying on back to sitting on the side of the bed? : Unable Difficulty sitting down on and standing up from a chair with arms (e.g., wheelchair, bedside commode, etc,.)?: A Little Help needed moving to and from a bed to chair (including a wheelchair)?: A Little Help needed walking in hospital room?: A Little Help needed climbing 3-5 steps with a railing? : A Little 6 Click Score: 16    End of Session Equipment Utilized During Treatment: Gait belt Activity Tolerance: Patient tolerated treatment well Patient left: in bed;with bed alarm set Nurse Communication: Mobility status PT Visit Diagnosis: Unsteadiness on feet (R26.81);History of falling (Z91.81);Muscle weakness (generalized) (M62.81)    Time: 8466-5993 PT Time Calculation (min) (ACUTE ONLY): 18 min   Charges:   PT Evaluation $PT Eval Low Complexity: 1 Low PT Treatments $Therapeutic Activity: 8-22 mins   PT G CodesGreggory Stallion, PT, DPT [phone]   Tiki Tucciarone 12/28/2017, 12:26 PM

## 2017-12-28 NOTE — Plan of Care (Signed)
Patient noted with 18 beat run of VT. Patient asymptomatic. MD notified. New orders for magnesium. Oncoming RN aware.

## 2017-12-28 NOTE — Discharge Instructions (Signed)
[facility]

## 2017-12-28 NOTE — Consult Note (Signed)
[facility]  CARDIOLOGY CONSULT NOTE  Patient ID: Darryl Novak MRN: [id number] DOB/AGE: [birth year] 84 y.o.  Admit date: 12/27/2017 Referring Physician Mody Primary Physician [facility] Cardiologist Fath Reason for Consultation atrial fibrillation  HPI: The patient is a 84 year old gentleman referred for evaluation of atrial fibrillation and episode of wide-complex tachycardia.  The patient was admitted on 12/27/2017 for generalized weakness and inability to walk.  Patient was treated with intravenous steroids and IV fluid resuscitation with overall clinical improvement.  On telemetry, the patient had a 20 beat run of wide-complex tachycardia.  The patient is asymptomatic, denies chest pain shortness of breath or palpitations.  2D echocardiogram 04/30/2017 revealed normal left ventricular function, with LVEF of 55%, with moderate mitral and tricuspid regurgitation.  Troponins have been mostly negative, 0.03, 0.04, 0.03.  Review of systems complete and found to be negative unless listed above     Past Medical History:  Diagnosis Date  . Heart trouble   . Hypertension   . MI (myocardial infarction) ([place]) 1978  . Neuropathy   . Shingles   . Stroke ([place])   . Ulcer     History reviewed. No pertinent surgical history.  Medications Prior to Admission  Medication Sig Dispense Refill Last Dose  . ALPHA-LIPOIC ACID PO Take 300 mg by mouth daily.   12/26/2017 at 0800  . amLODipine (NORVASC) 5 MG tablet Take 5 mg by mouth daily.   12/26/2017 at 0800  . apixaban (ELIQUIS) 5 MG TABS tablet Take 5 mg by mouth 2 (two) times daily.    12/26/2017 at 2000  . Biotin 1000 MCG tablet Take 1,000 mcg by mouth daily.   12/26/2017 at 0800  . cholecalciferol (VITAMIN D) 400 units TABS tablet Take 400 Units by mouth daily.   12/26/2017 at 0800  . Coenzyme Q10 (CO Q-10) 100 MG CAPS Take 100 mg by mouth daily.    12/26/2017 at 0800  . feeding supplement, ENSURE ENLIVE, (ENSURE ENLIVE) LIQD Take 237 mLs by  mouth 3 (three) times daily between meals. 237 mL 12 Past Week at Unknown time  . gabapentin (NEURONTIN) 600 MG tablet Take 600 mg by mouth at bedtime.   12/26/2017 at 2000  . Multiple Vitamin (MULTIVITAMIN) tablet Take 1 tablet by mouth daily.   12/26/2017 at 2000  . polyethylene glycol (MIRALAX / GLYCOLAX) packet Take 17 g by mouth daily. 14 each 0 PRN at PRN  . potassium chloride (KLOR-CON M10) 10 MEQ tablet Take 10 mEq by mouth daily.    12/26/2017 at 0800  . simvastatin (ZOCOR) 40 MG tablet Take 40 mg by mouth at bedtime.   12/26/2017 at 2000  . telmisartan (MICARDIS) 40 MG tablet Take 40 mg by mouth daily.   12/26/2017 at 0800  . valACYclovir (VALTREX) 1000 MG tablet Take 1,000 mg by mouth 2 (two) times daily.   12/26/2017 at 2000  . [DISCONTINUED] predniSONE (DELTASONE) 20 MG tablet Take 3 tablets (60 mg total) by mouth daily. (Patient taking differently: Take 10 mg by mouth daily. ) 15 tablet 0 12/26/2017 at [place] History  . Marital status: Married    Spouse name: Not on file  . Number of children: Not on file  . Years of education: Not on file  . Highest education level: Not on file  Occupational History  . Not on file  Social Needs  . Financial resource strain: Not on file  . Food insecurity:    Worry: Not  on file    Inability: Not on file  . Transportation needs:    Medical: Not on file    Non-medical: Not on file  Tobacco Use  . Smoking status: Former Smoker    Types: Cigarettes  . Smokeless tobacco: Never Used  . Tobacco comment: quit 45 years ago  Substance and Sexual Activity  . Alcohol use: Yes    Comment: 2 drinks a night  . Drug use: No  . Sexual activity: Not Currently  Lifestyle  . Physical activity:    Days per week: Not on file    Minutes per session: Not on file  . Stress: Not on file  Relationships  . Social connections:    Talks on phone: Not on file    Gets together: Not on file    Attends religious service: Not on file     Active member of club or organization: Not on file    Attends meetings of clubs or organizations: Not on file    Relationship status: Not on file  . Intimate partner violence:    Fear of current or ex partner: Not on file    Emotionally abused: Not on file    Physically abused: Not on file    Forced sexual activity: Not on file  Other Topics Concern  . Not on file  Social History Narrative  . Not on file    Family History  Problem Relation Age of Onset  . Heart attack Father       Review of systems complete and found to be negative unless listed above      PHYSICAL EXAM  General: Well developed, well nourished, in no acute distress HEENT:  Normocephalic and atramatic Neck:  No JVD.  Lungs: Clear bilaterally to auscultation and percussion. Heart: HRRR . Normal S1 and S2 without gallops or murmurs.  Abdomen: Bowel sounds are positive, abdomen soft and non-tender  Msk:  Back normal, normal gait. Normal strength and tone for age. Extremities: No clubbing, cyanosis or edema.   Neuro: Alert and oriented X 3. Psych:  Good affect, responds appropriately  Labs:   Lab Results  Component Value Date   WBC 6.0 12/28/2017   HGB 11.3 (L) 12/28/2017   HCT 32.2 (L) 12/28/2017   MCV 103.0 (H) 12/28/2017   PLT 122 (L) 12/28/2017    Recent Labs  Lab 12/28/17 0711  NA 130*  K 4.0  CL 100*  CO2 23  BUN 19  CREATININE 1.00  CALCIUM 7.6*  PROT 4.9*  BILITOT 1.2  ALKPHOS 41  ALT 18  AST 26  GLUCOSE 151*   Lab Results  Component Value Date   TROPONINI 0.03 (HH) 12/28/2017   No results found for: CHOL No results found for: HDL No results found for: LDLCALC No results found for: TRIG No results found for: CHOLHDL No results found for: LDLDIRECT    Radiology: Dg Chest 2 View  Result Date: 12/27/2017 CLINICAL DATA:  Cough and weakness. EXAM: CHEST - 2 VIEW COMPARISON:  06/02/2016 FINDINGS: Chronic mild elevation of the right hemidiaphragm. Both lungs are clear. Heart  and mediastinum are within normal limits. Trachea is midline. No large pleural effusions. No acute bone abnormality. Chronic degenerative changes in both shoulders. IMPRESSION: No active cardiopulmonary disease. Electronically Signed   By: Markus Daft M.D.   On: 12/27/2017 14:11   Ct Head Wo Contrast  Result Date: 12/27/2017 CLINICAL DATA:  Weakness.  Altered consciousness. EXAM: CT HEAD WITHOUT CONTRAST TECHNIQUE: Contiguous  axial images were obtained from the base of the skull through the vertex without intravenous contrast. COMPARISON:  None. FINDINGS: Brain: Expected cerebral and cerebellar volume loss for age. Moderate to marked low density in the periventricular white matter likely related to small vessel disease. Vascular: Intracranial atherosclerosis. Skull: Normal Sinuses/Orbits: Fluid within the left maxillary sinus. Surgical changes of both globes. Clear mastoid air cells. Other: None. IMPRESSION: 1.  No acute intracranial abnormality. 2.  Cerebral atrophy and small vessel ischemic change. 3. Sinus disease. Electronically Signed   By: Abigail Miyamoto M.D.   On: 12/27/2017 13:59    EKG: Atrial fibrillation at a rate of 67 bpm.  ASSESSMENT AND PLAN:   1.  Nonsustained, wide-complex tachycardia, probable atrial fibrillation or SVT with aberrancy, with no normal left ventricular function, asymptomatic 2.  Chronic atrial fibrillation, followed by Dr. Ubaldo Glassing, on Eliquis for stroke prevention  Recommendations  1.  Continue current medications 2.  Continue Eliquis for stroke prevention 3.  Review 2D echocardiogram 4.  Follow-up with Dr. Ubaldo Glassing as outpatient  Signed: Isaias Cowman MD,PhD, [facility] 12/28/2017, 1:33 PM

## 2017-12-28 NOTE — Care Management Note (Signed)
Case Management Note  Patient Details  Name: Darryl Novak MRN: [id number] Date of Birth: December 18, 1925  Subjective/Objective:  Discharging today                  Action/Plan: Patient active with [facility] for RN, Pt and OT which will resume at discharge. Notified Sharyn Lull with [facility] of discharge. Family made aware of resumption of care and agree with POC. He will discharge home by car.   Expected Discharge Date:  12/28/17               Expected Discharge Plan:  [place]  In-House Referral:     Discharge planning Services  CM Consult  Post Acute Care Choice:  Home Health, Resumption of Svcs/PTA Provider Choice offered to:  Spouse  DME Arranged:    DME Agency:     HH Arranged:  RN, PT, OT HH Agency:  [facility]  Status of Service:  Completed, signed off  If discussed at [place] of Stay Meetings, dates discussed:    Additional Comments:  Jolly Mango, RN 12/28/2017, 2:26 PM

## 2017-12-28 NOTE — Care Management Obs Status (Signed)
[place] NOTIFICATION   Patient Details  Name: Darryl Novak MRN: [id number] Date of Birth: 05/30/1926   Medicare Observation Status Notification Given:  Yes    Jolly Mango, RN 12/28/2017, 2:22 PM

## 2017-12-31 ENCOUNTER — Emergency Department: Payer: Medicare Other

## 2017-12-31 ENCOUNTER — Other Ambulatory Visit: Payer: Self-pay

## 2017-12-31 ENCOUNTER — Encounter: Payer: Self-pay | Admitting: Emergency Medicine

## 2017-12-31 ENCOUNTER — Inpatient Hospital Stay
Admission: EM | Admit: 2017-12-31 | Discharge: 2018-01-09 | DRG: 643 | Disposition: A | Discharge status: Skilled Nursing Facility | Payer: Medicare Other | Attending: Internal Medicine | Admitting: Internal Medicine

## 2017-12-31 DIAGNOSIS — H548 Legal blindness, as defined in USA: Secondary | ICD-10-CM | POA: Diagnosis present

## 2017-12-31 DIAGNOSIS — R509 Fever, unspecified: Secondary | ICD-10-CM | POA: Diagnosis not present

## 2017-12-31 DIAGNOSIS — Z8673 Personal history of transient ischemic attack (TIA), and cerebral infarction without residual deficits: Secondary | ICD-10-CM

## 2017-12-31 DIAGNOSIS — Z7901 Long term (current) use of anticoagulants: Secondary | ICD-10-CM

## 2017-12-31 DIAGNOSIS — J9601 Acute respiratory failure with hypoxia: Secondary | ICD-10-CM | POA: Diagnosis not present

## 2017-12-31 DIAGNOSIS — I959 Hypotension, unspecified: Secondary | ICD-10-CM | POA: Diagnosis present

## 2017-12-31 DIAGNOSIS — R531 Weakness: Secondary | ICD-10-CM

## 2017-12-31 DIAGNOSIS — I252 Old myocardial infarction: Secondary | ICD-10-CM

## 2017-12-31 DIAGNOSIS — E876 Hypokalemia: Secondary | ICD-10-CM | POA: Diagnosis present

## 2017-12-31 DIAGNOSIS — E860 Dehydration: Secondary | ICD-10-CM | POA: Diagnosis present

## 2017-12-31 DIAGNOSIS — I10 Essential (primary) hypertension: Secondary | ICD-10-CM | POA: Diagnosis present

## 2017-12-31 DIAGNOSIS — E861 Hypovolemia: Secondary | ICD-10-CM | POA: Diagnosis present

## 2017-12-31 DIAGNOSIS — B028 Zoster with other complications: Secondary | ICD-10-CM | POA: Diagnosis present

## 2017-12-31 DIAGNOSIS — R609 Edema, unspecified: Secondary | ICD-10-CM

## 2017-12-31 DIAGNOSIS — G629 Polyneuropathy, unspecified: Secondary | ICD-10-CM | POA: Diagnosis present

## 2017-12-31 DIAGNOSIS — Z888 Allergy status to other drugs, medicaments and biological substances status: Secondary | ICD-10-CM

## 2017-12-31 DIAGNOSIS — R627 Adult failure to thrive: Secondary | ICD-10-CM | POA: Diagnosis present

## 2017-12-31 DIAGNOSIS — R0902 Hypoxemia: Secondary | ICD-10-CM

## 2017-12-31 DIAGNOSIS — Z515 Encounter for palliative care: Secondary | ICD-10-CM | POA: Diagnosis not present

## 2017-12-31 DIAGNOSIS — E222 Syndrome of inappropriate secretion of antidiuretic hormone: Secondary | ICD-10-CM | POA: Diagnosis not present

## 2017-12-31 DIAGNOSIS — J189 Pneumonia, unspecified organism: Secondary | ICD-10-CM | POA: Diagnosis not present

## 2017-12-31 DIAGNOSIS — Z79899 Other long term (current) drug therapy: Secondary | ICD-10-CM

## 2017-12-31 DIAGNOSIS — Z87891 Personal history of nicotine dependence: Secondary | ICD-10-CM

## 2017-12-31 DIAGNOSIS — Z66 Do not resuscitate: Secondary | ICD-10-CM | POA: Diagnosis present

## 2017-12-31 DIAGNOSIS — I482 Chronic atrial fibrillation: Secondary | ICD-10-CM | POA: Diagnosis present

## 2017-12-31 LAB — CBC
HCT: 36 % — ABNORMAL LOW (ref 40.0–52.0)
HEMOGLOBIN: 12.9 g/dL — AB (ref 13.0–18.0)
MCH: 36.5 pg — AB (ref 26.0–34.0)
MCHC: 35.7 g/dL (ref 32.0–36.0)
MCV: 102.3 fL — ABNORMAL HIGH (ref 80.0–100.0)
PLATELETS: 149 10*3/uL — AB (ref 150–440)
RBC: 3.52 MIL/uL — AB (ref 4.40–5.90)
RDW: 20.4 % — ABNORMAL HIGH (ref 11.5–14.5)
WBC: 7.6 10*3/uL (ref 3.8–10.6)

## 2017-12-31 LAB — LACTIC ACID, PLASMA
LACTIC ACID, VENOUS: 1.5 mmol/L (ref 0.5–1.9)
LACTIC ACID, VENOUS: 1.8 mmol/L (ref 0.5–1.9)

## 2017-12-31 LAB — BASIC METABOLIC PANEL
Anion gap: 11 (ref 5–15)
BUN: 17 mg/dL (ref 6–20)
CHLORIDE: 93 mmol/L — AB (ref 101–111)
CO2: 25 mmol/L (ref 22–32)
CREATININE: 0.89 mg/dL (ref 0.61–1.24)
Calcium: 8.4 mg/dL — ABNORMAL LOW (ref 8.9–10.3)
GFR calc non Af Amer: >60 mL/min (ref >60)
GLUCOSE: 113 mg/dL — AB (ref 65–99)
Potassium: 3.5 mmol/L (ref 3.5–5.1)
Sodium: 129 mmol/L — ABNORMAL LOW (ref 135–145)

## 2017-12-31 LAB — URINALYSIS, COMPLETE (UACMP) WITH MICROSCOPIC
Bacteria, UA: NONE SEEN
Bilirubin Urine: NEGATIVE
Glucose, UA: NEGATIVE mg/dL
Hgb urine dipstick: NEGATIVE
KETONES UR: NEGATIVE mg/dL
Leukocytes, UA: NEGATIVE
Nitrite: NEGATIVE
PROTEIN: NEGATIVE mg/dL
Specific Gravity, Urine: 1.014 (ref 1.005–1.030)
pH: 6 (ref 5.0–8.0)

## 2017-12-31 LAB — TROPONIN I: Troponin I: 0.05 ng/mL (ref <0.03)

## 2017-12-31 MED ORDER — POLYETHYLENE GLYCOL 3350 17 G PO PACK
17.0000 g | PACK | Freq: Every day | ORAL | Status: DC
  Administered 2017-12-31 – 2018-01-09 (×8): 17 g via ORAL
  Filled 2017-12-31 (×8): qty 1

## 2017-12-31 MED ORDER — ACETAMINOPHEN 650 MG RE SUPP: 650.0000 mg | Freq: Four times a day (QID) | RECTAL | Status: DC | PRN

## 2017-12-31 MED ORDER — ZOLPIDEM TARTRATE 5 MG PO TABS
5.0000 mg | ORAL_TABLET | Freq: Every evening | ORAL | Status: DC | PRN
  Administered 2018-01-01 – 2018-01-03 (×3): 5 mg via ORAL
  Filled 2017-12-31 (×4): qty 1

## 2017-12-31 MED ORDER — ADULT MULTIVITAMIN W/MINERALS CH
1.0000 | ORAL_TABLET | Freq: Every day | ORAL | Status: DC
  Administered 2017-12-31 – 2018-01-09 (×10): 1 via ORAL
  Filled 2017-12-31 (×10): qty 1

## 2017-12-31 MED ORDER — SODIUM CHLORIDE 0.9 % IV BOLUS
1000.0000 mL | Freq: Once | INTRAVENOUS | Status: AC
  Administered 2017-12-31: 1000 mL via INTRAVENOUS

## 2017-12-31 MED ORDER — VALACYCLOVIR HCL 500 MG PO TABS
1000.0000 mg | ORAL_TABLET | Freq: Two times a day (BID) | ORAL | Status: DC
  Administered 2018-01-01 – 2018-01-09 (×17): 1000 mg via ORAL
  Filled 2017-12-31 (×19): qty 2

## 2017-12-31 MED ORDER — ONDANSETRON HCL 4 MG PO TABS: 4.0000 mg | ORAL_TABLET | Freq: Four times a day (QID) | ORAL | Status: DC | PRN

## 2017-12-31 MED ORDER — CHOLECALCIFEROL 10 MCG (400 UNIT) PO TABS
400.0000 [IU] | ORAL_TABLET | Freq: Every day | ORAL | Status: DC
  Administered 2018-01-01 – 2018-01-09 (×9): 400 [IU] via ORAL
  Filled 2017-12-31 (×9): qty 1

## 2017-12-31 MED ORDER — SIMVASTATIN 20 MG PO TABS
40.0000 mg | ORAL_TABLET | Freq: Every day | ORAL | Status: DC
  Administered 2017-12-31 – 2018-01-08 (×9): 40 mg via ORAL
  Filled 2017-12-31 (×9): qty 2

## 2017-12-31 MED ORDER — ONDANSETRON HCL 4 MG/2ML IJ SOLN
4.0000 mg | Freq: Four times a day (QID) | INTRAMUSCULAR | Status: DC | PRN
  Administered 2018-01-07: 4 mg via INTRAVENOUS
  Filled 2017-12-31: qty 2

## 2017-12-31 MED ORDER — ENSURE ENLIVE PO LIQD
237.0000 mL | Freq: Three times a day (TID) | ORAL | Status: DC
  Administered 2017-12-31 – 2018-01-09 (×21): 237 mL via ORAL

## 2017-12-31 MED ORDER — CO Q-10 100 MG PO CAPS: 100.0000 mg | ORAL_CAPSULE | Freq: Every day | ORAL | Status: DC

## 2017-12-31 MED ORDER — BIOTIN 1000 MCG PO TABS: 1000.0000 ug | ORAL_TABLET | Freq: Every day | ORAL | Status: DC

## 2017-12-31 MED ORDER — ACETAMINOPHEN 325 MG PO TABS
650.0000 mg | ORAL_TABLET | Freq: Four times a day (QID) | ORAL | Status: DC | PRN
  Administered 2018-01-01: 650 mg via ORAL
  Filled 2017-12-31 (×2): qty 2

## 2017-12-31 MED ORDER — GABAPENTIN 600 MG PO TABS
600.0000 mg | ORAL_TABLET | Freq: Every day | ORAL | Status: DC
  Administered 2017-12-31 – 2018-01-08 (×9): 600 mg via ORAL
  Filled 2017-12-31 (×9): qty 1

## 2017-12-31 MED ORDER — SODIUM CHLORIDE 0.9 % IV SOLN
INTRAVENOUS | Status: DC
  Administered 2017-12-31 – 2018-01-01 (×3): via INTRAVENOUS

## 2017-12-31 MED ORDER — APIXABAN 5 MG PO TABS
5.0000 mg | ORAL_TABLET | Freq: Two times a day (BID) | ORAL | Status: DC
  Administered 2017-12-31 – 2018-01-09 (×18): 5 mg via ORAL
  Filled 2017-12-31 (×18): qty 1

## 2017-12-31 NOTE — Progress Notes (Signed)
Advanced care plan.  Purpose of the Encounter: CODE STATUS  Parties in Attendance: Patient and Patients son (Darryl Novak)  Patient's Decision Capacity: Ok  Subjective/Patient's story:Presented with generalized weakness  Objective/Medical story Dehydrated Has low sodium level  Goals of care determination: Advance directives discussed Patient and patients [name] ) do not want any cardiac resuscitation, intubation, ventilator if need arises. Patient is DNR by code status  CODE STATUS: DNR   Time spent discussing advanced care planning: 16 minutes

## 2017-12-31 NOTE — ED Notes (Signed)
This RN to bedside at this time, pt's son remains at bedside. This RN explained waiting for bed assignment prior to patient going upstairs. Apologized for delay. Pt is resting comfortably in bed at this time. Will continue to monitor for further patient needs.

## 2017-12-31 NOTE — ED Notes (Signed)
Pt's son reports that patient was admitted on Friday 12/26/17 for hypotension after having several rounds of diarrhea. Pt's son reports pt received several liters of fluid and was D/C on 3/31. Pt is ambulatory with a walker at baseline after being D/C from a rehab facility from recently going blind in both eyes from shingles. Pt's son reports this AM pt had an at home BP of 80/60, upon EMS arrival BP 90/60. Pt presents to ED via [facility] with c/o "feeling tired" for today and per son pt is not able to stand at all. Pt is alert and able to answer questions at this time.

## 2017-12-31 NOTE — ED Triage Notes (Signed)
Pt presents to ED via [facility] from home c/o generalized weakness. Pt discharged from [facility] 12/28/17 after admission for same. EMS report temp 100.7 and O2 sat 91% on RA.

## 2017-12-31 NOTE — ED Notes (Signed)
Pt taken to CT at this time.

## 2017-12-31 NOTE — ED Notes (Signed)
Called to floor to give report. Romie Minus RN requested 10 more minutes to have time to look at the patient's chart.

## 2017-12-31 NOTE — Progress Notes (Signed)
PHARMACIST - PHYSICIAN ORDER COMMUNICATION  CONCERNING: P&T Medication Policy on Herbal Medications  DESCRIPTION:  This patient's order for:  Co Q-10,  Biotin has been noted.  This product(s) is classified as an "herbal" or natural product. Due to a lack of definitive safety studies or FDA approval, nonstandard manufacturing practices, plus the potential risk of unknown drug-drug interactions while on inpatient medications, the Pharmacy and Therapeutics Committee does not permit the use of "herbal" or natural products of this type within [place].   ACTION TAKEN: The pharmacy department is unable to verify this order at this time and your patient has been informed of this safety policy. Please reevaluate patient's clinical condition at discharge and address if the herbal or natural product(s) should be resumed at that time.  

## 2017-12-31 NOTE — H&P (Signed)
[place] [facility] [place] NAME: Darryl Novak    MR#:  [id number]  DATE OF BIRTH:  January 15, 1926  DATE OF ADMISSION:  12/31/2017  PRIMARY CARE PHYSICIAN: Baxter Hire, MD   REQUESTING/REFERRING PHYSICIAN:   CHIEF COMPLAINT:   Chief Complaint  Patient presents with  . Weakness    HISTORY OF PRESENT ILLNESS: Darryl Novak  is a 84 y.o. 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   Presented back to the emergency room today for weakness.  Wife unable to take care of him at home.  Used to walk with the help of a walker but after becoming blind in the left eye patient has not been ambulating well.  Workup in the emergency room room showed low sodium level.  CT head no acute abnormality.  Patient has been compliant with his Valtrex medication.  No history of fall, head injury.  PAST MEDICAL HISTORY:   Past Medical History:  Diagnosis Date  . Heart trouble   . Hypertension   . MI (myocardial infarction) ([place]) 1978  . Neuropathy   . Shingles   . Stroke ([place])   . Ulcer     PAST SURGICAL HISTORY: History reviewed. No pertinent surgical history.  SOCIAL HISTORY:  Social History   Tobacco Use  . Smoking status: Former Smoker    Types: Cigarettes  . Smokeless tobacco: Never Used  . Tobacco comment: quit 45 years ago  Substance Use Topics  . Alcohol use: Yes    Comment: 2 drinks a night    FAMILY HISTORY:  Family History  Problem Relation Age of Onset  . Heart attack Father     DRUG ALLERGIES:  Allergies  Allergen Reactions  . Ace Inhibitors Other (See Comments)    Intolerant    REVIEW OF SYSTEMS:   CONSTITUTIONAL: No fever, has fatigue and weakness.  EYES: No blurred or double vision.  EARS, NOSE, AND THROAT: No tinnitus or  ear pain.  Redness around left eye reolving RESPIRATORY: No cough, shortness of breath, wheezing or hemoptysis.  CARDIOVASCULAR: No chest pain, orthopnea, edema.  GASTROINTESTINAL: No nausea, vomiting, diarrhea or abdominal pain.  GENITOURINARY: No dysuria, hematuria.  ENDOCRINE: No polyuria, nocturia,  HEMATOLOGY: No anemia, easy bruising or bleeding SKIN: No rash or lesion. MUSCULOSKELETAL: No joint pain or arthritis.   NEUROLOGIC: No tingling, numbness, weakness.  PSYCHIATRY: No anxiety or depression.   MEDICATIONS AT HOME:  Prior to Admission medications   Medication Sig Start Date End Date Taking? Authorizing Provider  ALPHA-LIPOIC ACID PO Take 300 mg by mouth daily.   Yes [provider]  apixaban (ELIQUIS) 5 MG TABS tablet Take 5 mg by mouth 2 (two) times daily.    Yes [provider]  Biotin 1000 MCG tablet Take 1,000 mcg by mouth daily.   Yes [provider]  cholecalciferol (VITAMIN D) 400 units TABS tablet Take 400 Units by mouth daily.   Yes [provider]  Coenzyme Q10 (CO Q-10) 100 MG CAPS Take 100 mg by mouth daily.    Yes [provider]  feeding supplement, ENSURE ENLIVE, (ENSURE ENLIVE) LIQD Take 237 mLs by mouth 3 (three) times daily between meals. 11/19/17  Yes Vaughan Basta, MD  gabapentin (NEURONTIN) 600 MG tablet Take 600 mg by mouth at bedtime.  Yes [provider]  Multiple Vitamin (MULTIVITAMIN) tablet Take 1 tablet by mouth daily.   Yes [provider]  polyethylene glycol (MIRALAX / GLYCOLAX) packet Take 17 g by mouth daily. 11/19/17  Yes Vaughan Basta, MD  simvastatin (ZOCOR) 40 MG tablet Take 40 mg by mouth at bedtime.   Yes [provider]  valACYclovir (VALTREX) 1000 MG tablet Take 1,000 mg by mouth 2 (two) times daily.   Yes [provider]  predniSONE (DELTASONE) 20 MG tablet Take 0.5 tablets (10 mg total) by mouth daily. Patient not taking: Reported on  12/31/2017 12/28/17   Bettey Costa, MD      PHYSICAL EXAMINATION:   VITAL SIGNS: Blood pressure 103/65, pulse 97, temperature 99 F (37.2 C), temperature source Oral, resp. rate 16, height 5' 7.5" (1.715 m), weight 68 kg (150 lb), SpO2 94 %.  GENERAL:  83 y.o.-year-old patient lying in the bed with no acute distress.  EYES: Pupils equal, round, reactive to light and accommodation. No scleral icterus. Extraocular muscles intact.  HEENT: Head atraumatic, normocephalic. Oropharynx and nasopharynx clear.  NECK:  Supple, no jugular venous distention. No thyroid enlargement, no tenderness.  LUNGS: Normal breath sounds bilaterally, no wheezing, rales,rhonchi or crepitation. No use of accessory muscles of respiration.  CARDIOVASCULAR: S1, S2 normal. No murmurs, rubs, or gallops.  ABDOMEN: Soft, nontender, nondistended. Bowel sounds present. No organomegaly or mass.  EXTREMITIES: No pedal edema, cyanosis, or clubbing.  NEUROLOGIC: Cranial nerves II through XII are intact. Muscle strength 5/5 in all extremities. Sensation intact. Gait not checked.  PSYCHIATRIC: The patient is alert and oriented x 3.  SKIN: No obvious rash, lesion, or ulcer.   LABORATORY PANEL:   CBC Recent Labs  Lab 12/27/17 1131 12/28/17 0711 12/31/17 1425  WBC 9.6 6.0 7.6  HGB 13.2 11.3* 12.9*  HCT 38.3* 32.2* 36.0*  PLT 149* 122* 149*  MCV 103.5* 103.0* 102.3*  MCH 35.7* 36.0* 36.5*  MCHC 34.5 34.9 35.7  RDW 20.1* 20.6* 20.4*  LYMPHSABS 0.5*  --   --   MONOABS 0.4  --   --   EOSABS 0.0  --   --   BASOSABS 0.0  --   --    ------------------------------------------------------------------------------------------------------------------  Chemistries  Recent Labs  Lab 12/27/17 1131 12/28/17 0711 12/31/17 1425  NA 130* 130* 129*  K 3.7 4.0 3.5  CL 93* 100* 93*  CO2 24 23 25   GLUCOSE 111* 151* 113*  BUN 17 19 17   CREATININE 1.11 1.00 0.89  CALCIUM 8.6* 7.6* 8.4*  MG  --  1.6*  --   AST 36 26  --   ALT 23 18   --   ALKPHOS 51 41  --   BILITOT 1.5* 1.2  --    ------------------------------------------------------------------------------------------------------------------ estimated creatinine clearance is 52.5 mL/min (by C-G formula based on SCr of 0.89 mg/dL). ------------------------------------------------------------------------------------------------------------------ No results for input(s): TSH, T4TOTAL, T3FREE, THYROIDAB in the last 72 hours.  Invalid input(s): FREET3   Coagulation profile No results for input(s): INR, PROTIME in the last 168 hours. ------------------------------------------------------------------------------------------------------------------- No results for input(s): DDIMER in the last 72 hours. -------------------------------------------------------------------------------------------------------------------  Cardiac Enzymes Recent Labs  Lab 12/27/17 1131 12/28/17 0711 12/31/17 1432  TROPONINI 0.04* 0.03* 0.05*   ------------------------------------------------------------------------------------------------------------------ Invalid input(s): POCBNP  ---------------------------------------------------------------------------------------------------------------  Urinalysis    Component Value Date/Time   COLORURINE YELLOW (A) 12/31/2017 1425   APPEARANCEUR CLEAR (A) 12/31/2017 1425   LABSPEC 1.014 12/31/2017 1425   PHURINE 6.0 12/31/2017 1425   GLUCOSEU NEGATIVE  12/31/2017 [place] 12/31/2017 1425   BILIRUBINUR NEGATIVE 12/31/2017 [place] 12/31/2017 1425   PROTEINUR NEGATIVE 12/31/2017 1425   NITRITE NEGATIVE 12/31/2017 1425   LEUKOCYTESUR NEGATIVE 12/31/2017 1425     RADIOLOGY: Dg Chest 2 View  Result Date: 12/31/2017 CLINICAL DATA:  Fever and weakness. EXAM: CHEST - 2 VIEW COMPARISON:  Chest x-ray dated December 27, 2017. FINDINGS: The heart size and mediastinal contours are within normal limits. Normal pulmonary  vascularity. Low lung volumes. No focal consolidation, pleural effusion, or pneumothorax. No acute osseous abnormality. IMPRESSION: No active cardiopulmonary disease. Electronically Signed   By: Titus Dubin M.D.   On: 12/31/2017 15:59   Ct Head Wo Contrast  Result Date: 12/31/2017 CLINICAL DATA:  Generalized weakness.  Hypotension. EXAM: CT HEAD WITHOUT CONTRAST TECHNIQUE: Contiguous axial images were obtained from the base of the skull through the vertex without intravenous contrast. COMPARISON:  12/27/2017 FINDINGS: Brain: There is no evidence of acute infarct, intracranial hemorrhage, mass, midline shift, or extra-axial fluid collection. Patchy to confluent hypodensities throughout the cerebral white matter are unchanged from the recent prior and nonspecific but compatible with moderately extensive chronic small vessel ischemic disease. Generalized cerebral atrophy is unchanged and not greater than expected for patient's age. Vascular: Calcified atherosclerosis at the skull base. No hyperdense vessel. Skull: No fracture or suspicious osseous lesion. Sinuses/Orbits: Mild left maxillary sinus mucosal thickening. chronic appearing left mastoid air cell minimal opacification. Bilateral cataract extraction. Other: None. IMPRESSION: 1. No evidence of acute intracranial abnormality. 2. Moderate chronic small vessel ischemic disease. Electronically Signed   By: Logan Bores M.D.   On: 12/31/2017 15:51    EKG: Orders placed or performed during the hospital encounter of 12/31/17  . ED EKG  . ED EKG    IMPRESSION AND PLAN:  84 year old elderly male patient with history of herpes zoster with retinal necrosis and blindness in the left eye, neuropathy, stroke, hypertension presented to the emergency room with weakness.  Oxygen saturation was low and patient was put on oxygen via nasal cannula.  No fever chills and cough.  1.  Failure to thrive Supportive care Nutritional supplements  2.   Hyponatremia IV fluid hydration  3. Dehydration IV fluids  4.  Ambulatory dysfunction Physical therapy evaluation  5.  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  DNR by code status  All the records are reviewed and case discussed with ED provider. Management plans discussed with the patient, family and they are in agreement.  CODE STATUS:DNR    Code Status Orders  (From admission, onward)        Start     Ordered   12/31/17 1700  Do not attempt resuscitation (DNR)  Continuous    Question Answer Comment  In the event of cardiac or respiratory ARREST Do not call a "code blue"   In the event of cardiac or respiratory ARREST Do not perform Intubation, CPR, defibrillation or ACLS   In the event of cardiac or respiratory ARREST Use medication by any route, position, wound care, and other measures to relive pain and suffering. May use oxygen, suction and manual treatment of airway obstruction as needed for comfort.      12/31/17 1659    Code Status History    Date Active Date Inactive Code Status Order ID Comments User Context   12/27/2017 1730 12/28/2017 1948 Full Code [id number]  [place],  Leonie Douglas, MD Inpatient   11/13/2017 2025 11/19/2017 1744 Full Code [id number]  Henreitta Leber, MD Inpatient       TOTAL TIME TAKING CARE OF THIS PATIENT: 51 minutes.    Saundra Shelling M.D on 12/31/2017 at 5:00 PM  Between 7am to 6pm - Pager - [phone]  After 6pm go to [email or web address] - password [email or web address] [facility]  Office  [phone]  CC: Primary care physician; Baxter Hire, MD

## 2017-12-31 NOTE — ED Notes (Signed)
Date and time results received: 12/31/17 4:08 PM  (use smartphrase ".now" to insert current time)  Test: Trop Critical Value: 0.05  Name of Provider Notified: Dr. Kerman Passey  Orders Received? Or Actions Taken?: Critical Results Acknowledged

## 2017-12-31 NOTE — ED Notes (Signed)
Pt transferred to hospital bed for patient comfort. Pt tolerated well. Pt repositioned in the bed. This RN apologized for and explained delay. Pt and son state understanding. Will continue to monitor for further patient needs.

## 2017-12-31 NOTE — ED Provider Notes (Signed)
[facility] Emergency Department Provider Note  Time seen: 3:32 PM  I have reviewed the triage vital signs and the nursing notes.   HISTORY  Chief Complaint Weakness    HPI Darryl Novak is a 84 y.o. male with a past medical history of hypertension, MI, ocular shingles, blindness, CVA, presents to the emergency department for generalized fatigue and weakness.  According to the son the patient lives at home alone with his wife, since this morning he has been extremely weak now unable to stand or walk due to weakness.  EMS report of 100.6 fever and a 91% room air saturation.  Afebrile to 99 upon arrival in the emergency department with a 94% room air saturation.  Largely negative review of systems.  Patient denies any headache, focal weakness or numbness states generalized weakness.  Denies chest pain or abdominal pain, nausea vomiting, diarrhea or dysuria.   Past Medical History:  Diagnosis Date  . Heart trouble   . Hypertension   . MI (myocardial infarction) ([place]) 1978  . Neuropathy   . Shingles   . Stroke ([place])   . Ulcer     Patient Active Problem List   Diagnosis Date Noted  . Weakness generalized 12/27/2017  . Hypotension 12/27/2017  . Decubital ulcer 12/27/2017  . 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 Acute renal failure (ARF) ([place]) 11/13/2017  . Chronic atrial fibrillation ([place]) 04/10/2016  . Obstructive sleep apnea syndrome 10/17/2015  . Cerebrovascular accident, old 09/19/2014  . Arteriosclerosis of coronary artery 03/28/2014  . HLD (hyperlipidemia) 03/28/2014  . BP (high blood pressure) 03/28/2014  . Addison anemia 03/28/2014  . Peripheral vascular disease ([place]) 03/28/2014  . Cerebrovascular accident (CVA) ([place]) 03/28/2014    History reviewed. No pertinent surgical history.  Prior to Admission medications   Medication Sig Start Date End Date Taking? Authorizing Provider  ALPHA-LIPOIC ACID PO Take 300 mg by mouth daily.     [provider]  apixaban (ELIQUIS) 5 MG TABS tablet Take 5 mg by mouth 2 (two) times daily.     [provider]  Biotin 1000 MCG tablet Take 1,000 mcg by mouth daily.    [provider]  cholecalciferol (VITAMIN D) 400 units TABS tablet Take 400 Units by mouth daily.    [provider]  Coenzyme Q10 (CO Q-10) 100 MG CAPS Take 100 mg by mouth daily.     [provider]  feeding supplement, ENSURE ENLIVE, (ENSURE ENLIVE) LIQD Take 237 mLs by mouth 3 (three) times daily between meals. 11/19/17   Vaughan Basta, MD  gabapentin (NEURONTIN) 600 MG tablet Take 600 mg by mouth at bedtime.    [provider]  Multiple Vitamin (MULTIVITAMIN) tablet Take 1 tablet by mouth daily.    [provider]  polyethylene glycol (MIRALAX / GLYCOLAX) packet Take 17 g by mouth daily. 11/19/17   Vaughan Basta, MD  predniSONE (DELTASONE) 20 MG tablet Take 0.5 tablets (10 mg total) by mouth daily. 12/28/17   Bettey Costa, MD  simvastatin (ZOCOR) 40 MG tablet Take 40 mg by mouth at bedtime.    [provider]  valACYclovir (VALTREX) 1000 MG tablet Take 1,000 mg by mouth 2 (two) times daily.    [provider]    Allergies  Allergen Reactions  . Ace Inhibitors Other (See Comments)    Intolerant    Family History  Problem Relation Age of Onset  . Heart attack Father     Social History  Social History   Tobacco Use  . Smoking status: Former Smoker    Types: Cigarettes  . Smokeless tobacco: Never Used  . Tobacco comment: quit 45 years ago  Substance Use Topics  . Alcohol use: Yes    Comment: 2 drinks a night  . Drug use: No    Review of Systems Constitutional: Fever per EMS, afebrile in the emergency department.  Denies aspirin/Tylenol/ibuprofen use.  Positive for generalized weakness. Eyes: Blindness, no acute issue ENT: Negative for recent illness/congestion Cardiovascular: Negative for chest  pain. Respiratory: Negative for shortness of breath. Gastrointestinal: Negative for abdominal pain, vomiting and diarrhea. Genitourinary: Negative for urinary compaints Musculoskeletal: Negative for musculoskeletal complaints Skin: Negative for skin complaints  Neurological: Negative for headache All other ROS negative  ____________________________________________   PHYSICAL EXAM:  VITAL SIGNS: ED Triage Vitals [12/31/17 1420]  Enc Vitals Group     BP 103/65     Pulse Rate 97     Resp 16     Temp 99 F (37.2 C)     Temp Source Oral     SpO2 94 %     Weight 150 lb (68 kg)     Height 5' 7.5" (1.715 m)     Head Circumference      Peak Flow      Pain Score 0     Pain Loc      Pain Edu?      Excl. in [place]?    Constitutional: Alert and oriented. Well appearing and in no distress. Eyes: Keeps eyes closed, mild erythema bilaterally ENT   Head: Normocephalic and atraumatic.   Mouth/Throat: Mucous membranes are moist. Cardiovascular: Normal rate, regular rhythm. No murmur Respiratory: Normal respiratory effort without tachypnea nor retractions. Breath sounds are clear  Gastrointestinal: Soft and nontender. No distention.   Musculoskeletal: Nontender with normal range of motion in all extremities.  Neurologic: Normal speech and language.  Patient has diffuse weakness, 3/5 in bilateral lower extremities.  No focal deficits identified.  4/5 strength in bilateral upper extremities but bilateral drift after several seconds.  No cranial nerve deficits. Skin:  Skin is warm, dry and intact.  Psychiatric: Mood and affect are normal. Speech and behavior are normal.   ____________________________________________    EKG  EKG reviewed and interpreted by myself shows atrial fibrillation at 95 bpm, narrow QRS, normal axis, normal intervals, nonspecific ST changes.  ____________________________________________    RADIOLOGY  X-ray normal CT had  negative  ____________________________________________   INITIAL IMPRESSION / ASSESSMENT AND PLAN / ED COURSE  Pertinent labs & imaging results that were available during my care of the patient were reviewed by me and considered in my medical decision making (see chart for details).  Patient presents to the emergency department for generalized fatigue and weakness starting this morning, reported fever by EMS.  Here the patient is afebrile temperature of 99, heart rate 97.  Patient is generally weak.  Differential would include left light/metabolic abnormality, dehydration, infectious etiology, viral illness, urinary tract infection or pneumonia, CVA.  We will check labs, urinalysis, IV hydrate obtain CT imaging and a chest x-ray and continue to closely monitor.  Labs have resulted largely at the patient's baseline.  Hyponatremia which is largely unchanged.  Imaging including CT scan of the head and chest x-ray are negative.  Troponin slightly elevated 0.05 but largely unchanged from prior records.  Given the patient's generalized fatigue and weakness inability to ambulate will admit the patient to the hospitalist service for  continued workup and monitoring. ____________________________________________   FINAL CLINICAL IMPRESSION(S) / ED DIAGNOSES  Generalized fatigue/weakness    Harvest Dark, MD 12/31/17 1627

## 2017-12-31 NOTE — ED Notes (Signed)
O2 sat 90% on RA.

## 2017-12-31 NOTE — ED Notes (Signed)
Pt cleaned and placed into a clean brief at this time by this RN and Beverlee Nims, EDT. Pt tolerated well. Pt repositioned in bed and given warm blankets. Pt states he is comfortable. Once again apologized for and explained delay. Will continue to monitor for further patient needs.

## 2018-01-01 LAB — CULTURE, BLOOD (ROUTINE X 2)
CULTURE: NO GROWTH
SPECIAL REQUESTS: ADEQUATE

## 2018-01-01 LAB — BASIC METABOLIC PANEL
Anion gap: 11 (ref 5–15)
BUN: 12 mg/dL (ref 6–20)
CO2: 28 mmol/L (ref 22–32)
CREATININE: 0.76 mg/dL (ref 0.61–1.24)
Calcium: 8.2 mg/dL — ABNORMAL LOW (ref 8.9–10.3)
Chloride: 93 mmol/L — ABNORMAL LOW (ref 101–111)
GFR calc Af Amer: >60 mL/min (ref >60)
Glucose, Bld: 85 mg/dL (ref 65–99)
POTASSIUM: 3.1 mmol/L — AB (ref 3.5–5.1)
SODIUM: 132 mmol/L — AB (ref 135–145)

## 2018-01-01 MED ORDER — POTASSIUM CHLORIDE CRYS ER 20 MEQ PO TBCR
40.0000 meq | EXTENDED_RELEASE_TABLET | Freq: Once | ORAL | Status: AC
  Administered 2018-01-01: 40 meq via ORAL
  Filled 2018-01-01: qty 2

## 2018-01-01 NOTE — Progress Notes (Signed)
[name] at [place] NAME: Darryl Novak    MR#:  [id number]  DATE OF BIRTH:  Mar 26, 1926  SUBJECTIVE:   Patient here with weakness  REVIEW OF SYSTEMS:    Review of Systems  Constitutional: Negative for fever, chills weight loss +generalized weakness  HENT: Negative for ear pain, nosebleeds, congestion, facial swelling, rhinorrhea, neck pain, neck stiffness and ear discharge.   Respiratory: Negative for cough, shortness of breath, wheezing  Cardiovascular: Negative for chest pain, palpitations and leg swelling.  Gastrointestinal: Negative for heartburn, abdominal pain, vomiting, diarrhea or consitpation Genitourinary: Negative for dysuria, urgency, frequency, hematuria Musculoskeletal: Negative for back pain or joint pain Neurological: Negative for dizziness, seizures, syncope, focal weakness,  numbness and headaches.  Hematological: Does not bruise/bleed easily.  Psychiatric/Behavioral: Negative for hallucinations, confusion, dysphoric mood    Tolerating Diet: yes      DRUG ALLERGIES:   Allergies  Allergen Reactions  . Ace Inhibitors Other (See Comments)    Intolerant    VITALS:  Blood pressure 109/60, pulse 100, temperature 99.1 F (37.3 C), temperature source Oral, resp. rate (!) 28, height 5' 7.5" (1.715 m), weight 68 kg (150 lb), SpO2 90 %.  PHYSICAL EXAMINATION:  Constitutional: Appears well-developed and well-nourished. No distress. HENT: Normocephalic. Marland Kitchen Oropharynx is clear and moist.  Eyes: Conjunctivae and EOM are normal. PERRLA, no scleral icterus.  Neck: Normal ROM. Neck supple. No JVD. No tracheal deviation. CVS: RRR, S1/S2 +, no murmurs, no gallops, no carotid bruit.  Pulmonary: Effort and breath sounds normal, no stridor, rhonchi, wheezes, rales.  Abdominal: Soft. BS +,  no distension, tenderness, rebound or guarding.  Musculoskeletal: Normal range of motion. No edema and no tenderness.  Neuro: Alert. CN  2-12 grossly intact. No focal deficits. Skin: Skin is warm and dry. No rash noted. Psychiatric: Normal mood and affect.      LABORATORY PANEL:   CBC Recent Labs  Lab 12/31/17 1425  WBC 7.6  HGB 12.9*  HCT 36.0*  PLT 149*   ------------------------------------------------------------------------------------------------------------------  Chemistries  Recent Labs  Lab 12/28/17 0711  01/01/18 0435  NA 130*   < > 132*  K 4.0   < > 3.1*  CL 100*   < > 93*  CO2 23   < > 28  GLUCOSE 151*   < > 85  BUN 19   < > 12  CREATININE 1.00   < > 0.76  CALCIUM 7.6*   < > 8.2*  MG 1.6*  --   --   AST 26  --   --   ALT 18  --   --   ALKPHOS 41  --   --   BILITOT 1.2  --   --    < > = values in this interval not displayed.   ------------------------------------------------------------------------------------------------------------------  Cardiac Enzymes Recent Labs  Lab 12/27/17 1131 12/28/17 0711 12/31/17 1432  TROPONINI 0.04* 0.03* 0.05*   ------------------------------------------------------------------------------------------------------------------  RADIOLOGY:  Dg Chest 2 View  Result Date: 12/31/2017 CLINICAL DATA:  Fever and weakness. EXAM: CHEST - 2 VIEW COMPARISON:  Chest x-ray dated December 27, 2017. FINDINGS: The heart size and mediastinal contours are within normal limits. Normal pulmonary vascularity. Low lung volumes. No focal consolidation, pleural effusion, or pneumothorax. No acute osseous abnormality. IMPRESSION: No active cardiopulmonary disease. Electronically Signed   By: Titus Dubin M.D.   On: 12/31/2017 15:59   Ct Head Wo Contrast  Result Date: 12/31/2017 CLINICAL DATA:  Generalized weakness.  Hypotension. EXAM: CT HEAD WITHOUT CONTRAST TECHNIQUE: Contiguous axial images were obtained from the base of the skull through the vertex without intravenous contrast. COMPARISON:  12/27/2017 FINDINGS: Brain: There is no evidence of acute infarct, intracranial  hemorrhage, mass, midline shift, or extra-axial fluid collection. Patchy to confluent hypodensities throughout the cerebral white matter are unchanged from the recent prior and nonspecific but compatible with moderately extensive chronic small vessel ischemic disease. Generalized cerebral atrophy is unchanged and not greater than expected for patient's age. Vascular: Calcified atherosclerosis at the skull base. No hyperdense vessel. Skull: No fracture or suspicious osseous lesion. Sinuses/Orbits: Mild left maxillary sinus mucosal thickening. chronic appearing left mastoid air cell minimal opacification. Bilateral cataract extraction. Other: None. IMPRESSION: 1. No evidence of acute intracranial abnormality. 2. Moderate chronic small vessel ischemic disease. Electronically Signed   By: Logan Bores M.D.   On: 12/31/2017 15:51     ASSESSMENT AND PLAN:  84 year old male currently being treated for retinal herpes zoster, sinus and left eye who presents with generalized weakness.   1.  Generalized weakness with ATT:  He needs PT evaluation .  2.    Hyponatremia from poor p.o. intake and dehydration  3.  Hypotension from dehydration which is improved.    Blood pressure improved with IV fluids  4.  History of herpes zoster with retinal involvement: Continue valacyclovir  5.  Hypokalemia: Replete and recheck in a.m.  Sickle therapy consultation for discharge planning         Management plans discussed with the patient and wife and they are in agreement.  CODE STATUS: DNR  TOTAL TIME TAKING CARE OF THIS PATIENT: 30 minutes.     POSSIBLE D/C tomorrow, DEPENDING ON CLINICAL CONDITION.   Mallika Sanmiguel M.D on 01/01/2018 at 12:54 PM  Between 7am to 6pm - Pager - [phone] After 6pm go to [email or web address] - password [email or web address] [email or web address]  Office  [phone]  CC: Primary care physician; Baxter Hire, MD  Note: This dictation was prepared with Dragon dictation  along with smaller phrase technology. Any transcriptional errors that result from this process are unintentional.

## 2018-01-02 ENCOUNTER — Inpatient Hospital Stay: Payer: Medicare Other

## 2018-01-02 DIAGNOSIS — Z7189 Other specified counseling: Secondary | ICD-10-CM | POA: Diagnosis not present

## 2018-01-02 DIAGNOSIS — E222 Syndrome of inappropriate secretion of antidiuretic hormone: Secondary | ICD-10-CM | POA: Diagnosis present

## 2018-01-02 DIAGNOSIS — Z7901 Long term (current) use of anticoagulants: Secondary | ICD-10-CM | POA: Diagnosis not present

## 2018-01-02 DIAGNOSIS — R627 Adult failure to thrive: Secondary | ICD-10-CM | POA: Diagnosis present

## 2018-01-02 DIAGNOSIS — R531 Weakness: Secondary | ICD-10-CM | POA: Diagnosis not present

## 2018-01-02 DIAGNOSIS — Z8673 Personal history of transient ischemic attack (TIA), and cerebral infarction without residual deficits: Secondary | ICD-10-CM | POA: Diagnosis not present

## 2018-01-02 DIAGNOSIS — I10 Essential (primary) hypertension: Secondary | ICD-10-CM | POA: Diagnosis present

## 2018-01-02 DIAGNOSIS — B028 Zoster with other complications: Secondary | ICD-10-CM | POA: Diagnosis present

## 2018-01-02 DIAGNOSIS — Z515 Encounter for palliative care: Secondary | ICD-10-CM | POA: Diagnosis not present

## 2018-01-02 DIAGNOSIS — Z87891 Personal history of nicotine dependence: Secondary | ICD-10-CM | POA: Diagnosis not present

## 2018-01-02 DIAGNOSIS — Z66 Do not resuscitate: Secondary | ICD-10-CM | POA: Diagnosis present

## 2018-01-02 DIAGNOSIS — E861 Hypovolemia: Secondary | ICD-10-CM | POA: Diagnosis present

## 2018-01-02 DIAGNOSIS — R509 Fever, unspecified: Secondary | ICD-10-CM | POA: Diagnosis present

## 2018-01-02 DIAGNOSIS — J9601 Acute respiratory failure with hypoxia: Secondary | ICD-10-CM | POA: Diagnosis not present

## 2018-01-02 DIAGNOSIS — E860 Dehydration: Secondary | ICD-10-CM | POA: Diagnosis present

## 2018-01-02 DIAGNOSIS — Z888 Allergy status to other drugs, medicaments and biological substances status: Secondary | ICD-10-CM | POA: Diagnosis not present

## 2018-01-02 DIAGNOSIS — E876 Hypokalemia: Secondary | ICD-10-CM | POA: Diagnosis present

## 2018-01-02 DIAGNOSIS — I482 Chronic atrial fibrillation: Secondary | ICD-10-CM | POA: Diagnosis present

## 2018-01-02 DIAGNOSIS — J189 Pneumonia, unspecified organism: Secondary | ICD-10-CM | POA: Diagnosis not present

## 2018-01-02 DIAGNOSIS — Z79899 Other long term (current) drug therapy: Secondary | ICD-10-CM | POA: Diagnosis not present

## 2018-01-02 DIAGNOSIS — H548 Legal blindness, as defined in USA: Secondary | ICD-10-CM | POA: Diagnosis present

## 2018-01-02 DIAGNOSIS — I959 Hypotension, unspecified: Secondary | ICD-10-CM | POA: Diagnosis present

## 2018-01-02 DIAGNOSIS — I252 Old myocardial infarction: Secondary | ICD-10-CM | POA: Diagnosis not present

## 2018-01-02 DIAGNOSIS — G629 Polyneuropathy, unspecified: Secondary | ICD-10-CM | POA: Diagnosis present

## 2018-01-02 LAB — BASIC METABOLIC PANEL
ANION GAP: 7 (ref 5–15)
BUN: 18 mg/dL (ref 6–20)
CALCIUM: 7.9 mg/dL — AB (ref 8.9–10.3)
CO2: 26 mmol/L (ref 22–32)
Chloride: 97 mmol/L — ABNORMAL LOW (ref 101–111)
Creatinine, Ser: 0.83 mg/dL (ref 0.61–1.24)
Glucose, Bld: 112 mg/dL — ABNORMAL HIGH (ref 65–99)
Potassium: 3.5 mmol/L (ref 3.5–5.1)
Sodium: 130 mmol/L — ABNORMAL LOW (ref 135–145)

## 2018-01-02 NOTE — Plan of Care (Signed)

## 2018-01-02 NOTE — Progress Notes (Signed)
[place] at [place] NAME: Darryl Novak    MR#:  [id number]  DATE OF BIRTH:  [birth year]  SUBJECTIVE:  Patient had a fever last night No other issues +weak   REVIEW OF SYSTEMS:    Review of Systems  Constitutional:+ fever no chills weight loss +generalized weakness  HENT: Negative for ear pain, nosebleeds, congestion, facial swelling, rhinorrhea, neck pain, neck stiffness and ear discharge.   Blindness in right eye Respiratory: Negative for cough, shortness of breath, wheezing  Cardiovascular: Negative for chest pain, palpitations and leg swelling.  Gastrointestinal: Negative for heartburn, abdominal pain, vomiting, diarrhea or consitpation Genitourinary: Negative for dysuria, urgency, frequency, hematuria Musculoskeletal: Negative for back pain or joint pain Neurological: Negative for dizziness, seizures, syncope, focal weakness,  numbness and headaches.  Hematological: Does not bruise/bleed easily.  Psychiatric/Behavioral: Negative for hallucinations, confusion, dysphoric mood    Tolerating Diet: yes      DRUG ALLERGIES:   Allergies  Allergen Reactions  . Ace Inhibitors Other (See Comments)    Intolerant    VITALS:  Blood pressure 119/63, pulse 85, temperature 97.9 F (36.6 C), temperature source Oral, resp. rate 16, height 5' 7.5" (1.715 m), weight 68 kg (150 lb), SpO2 99 %.  PHYSICAL EXAMINATION:  Constitutional: Appears well-developed and well-nourished. No distress. HENT: Normocephalic. Marland Kitchen Oropharynx is clear and moist.  Eyes: Conjunctivae  are normal. , no scleral icterus.  Neck: Normal ROM. Neck supple. No JVD. No tracheal deviation. CVS: RRR, S1/S2 +, no murmurs, no gallops, no carotid bruit.  Pulmonary: Effort and breath sounds normal, no stridor, rhonchi, wheezes, rales.  Abdominal: Soft. BS +,  no distension, tenderness, rebound or guarding.  Musculoskeletal: Normal range of motion. No edema and no  tenderness.  Neuro: Alert. CN 2-12 grossly intact. No focal deficits. Skin: Skin is warm and dry. No rash noted. Psychiatric: Normal mood and affect.      LABORATORY PANEL:   CBC Recent Labs  Lab 12/31/17 1425  WBC 7.6  HGB 12.9*  HCT 36.0*  PLT 149*   ------------------------------------------------------------------------------------------------------------------  Chemistries  Recent Labs  Lab 12/28/17 0711  01/02/18 0449  NA 130*   < > 130*  K 4.0   < > 3.5  CL 100*   < > 97*  CO2 23   < > 26  GLUCOSE 151*   < > 112*  BUN 19   < > 18  CREATININE 1.00   < > 0.83  CALCIUM 7.6*   < > 7.9*  MG 1.6*  --   --   AST 26  --   --   ALT 18  --   --   ALKPHOS 41  --   --   BILITOT 1.2  --   --    < > = values in this interval not displayed.   ------------------------------------------------------------------------------------------------------------------  Cardiac Enzymes Recent Labs  Lab 12/27/17 1131 12/28/17 0711 12/31/17 1432  TROPONINI 0.04* 0.03* 0.05*   ------------------------------------------------------------------------------------------------------------------  RADIOLOGY:  Dg Chest 1 View  Result Date: 01/02/2018 CLINICAL DATA:  84 year old male with a history of fever EXAM: CHEST  1 VIEW COMPARISON:  12/31/2017, 12/27/2017 FINDINGS: Cardiomediastinal silhouette unchanged in size and contour. No evidence of central vascular congestion. No pneumothorax or pleural effusion. No confluent airspace disease. Similar appearance of interstitial prominence, similar to the prior. Degenerative changes of the shoulders. No displaced fracture IMPRESSION: Similar appearance of chronic lung changes without evidence of superimposed acute cardiopulmonary disease. Electronically  Signed   By: Corrie Mckusick D.O.   On: 01/02/2018 09:55   Dg Chest 2 View  Result Date: 12/31/2017 CLINICAL DATA:  Fever and weakness. EXAM: CHEST - 2 VIEW COMPARISON:  Chest x-ray dated December 27, 2017. FINDINGS: The heart size and mediastinal contours are within normal limits. Normal pulmonary vascularity. Low lung volumes. No focal consolidation, pleural effusion, or pneumothorax. No acute osseous abnormality. IMPRESSION: No active cardiopulmonary disease. Electronically Signed   By: Titus Dubin M.D.   On: 12/31/2017 15:59   Ct Head Wo Contrast  Result Date: 12/31/2017 CLINICAL DATA:  Generalized weakness.  Hypotension. EXAM: CT HEAD WITHOUT CONTRAST TECHNIQUE: Contiguous axial images were obtained from the base of the skull through the vertex without intravenous contrast. COMPARISON:  12/27/2017 FINDINGS: Brain: There is no evidence of acute infarct, intracranial hemorrhage, mass, midline shift, or extra-axial fluid collection. Patchy to confluent hypodensities throughout the cerebral white matter are unchanged from the recent prior and nonspecific but compatible with moderately extensive chronic small vessel ischemic disease. Generalized cerebral atrophy is unchanged and not greater than expected for patient's age. Vascular: Calcified atherosclerosis at the skull base. No hyperdense vessel. Skull: No fracture or suspicious osseous lesion. Sinuses/Orbits: Mild left maxillary sinus mucosal thickening. chronic appearing left mastoid air cell minimal opacification. Bilateral cataract extraction. Other: None. IMPRESSION: 1. No evidence of acute intracranial abnormality. 2. Moderate chronic small vessel ischemic disease. Electronically Signed   By: Logan Bores M.D.   On: 12/31/2017 15:51     ASSESSMENT AND PLAN:  84 year old male currently being treated for retinal herpes zoster, sinus and left eye who presents with generalized weakness.   1.  Generalized weakness with ATT:  Physical therapy is recommending skilled nursing facility . 2.    Hyponatremia from poor p.o. intake and dehydration Patient likely has component of hyponatremia which is chronic  3.  Hypotension from dehydration  which is improved.    Blood pressure improved with IV fluids  4.  History of herpes zoster with retinal involvement: Continue valacyclovir  5.  Hypokalemia: This was repleted  6.  Fever: Chest x-ray is negative.  UA from yesterday has been negative.  Patient will need skilled facility at discharge          Management plans discussed with the patient and he is in agreement  CODE STATUS: DNR  TOTAL TIME TAKING CARE OF THIS PATIENT: 24 minutes.     POSSIBLE D/C tomorrow, DEPENDING ON CLINICAL CONDITION.   Stormi Vandevelde M.D on 01/02/2018 at 12:11 PM  Between 7am to 6pm - Pager - [phone] After 6pm go to [email or web address] - password [email or web address] [place] [facility]  Office  [phone]  CC: Primary care physician; Baxter Hire, MD  Note: This dictation was prepared with Dragon dictation along with smaller phrase technology. Any transcriptional errors that result from this process are unintentional.

## 2018-01-02 NOTE — Clinical Social Work Note (Signed)
Clinical Social Work Assessment  Patient Details  Name: Darryl Novak MRN: [id number] Date of Birth: [birth year]  Date of referral:  01/02/18               Reason for consult:  Facility Placement                Permission sought to share information with:  Family Supports, Customer service manager Permission granted to share information::  Yes, Verbal Permission Granted  Name::     Clardy,[name] Spouse [phone] or [place], Drab   [phone]   Agency::  SNF admissions  Relationship::     Contact Information:     Housing/Transportation Living arrangements for the past 2 months:  Single Family Home Source of Information:  Patient, Spouse Patient Interpreter Needed:  None Criminal Activity/Legal Involvement Pertinent to Current Situation/Hospitalization:  No - Comment as needed Significant Relationships:  Adult Children, Spouse Lives with:  Spouse Do you feel safe going back to the place where you live?  No Need for family participation in patient care:  Yes (Comment)  Care giving concerns:  Patient and family feel they need some short term rehab before he is able to return back home.   Social Worker assessment / plan: Patient is a 84 year old male who is alert and oriented x4.  Patient states he was just recently in rehab.  Patient's wife was at bedside and stated she was in agreement to having patient go to rehab again.  CSW explained process and role of CSW to try to find a bed for patient.  Patient states he was just recently at [facility] and was familiar with what to expect at [facility].  CSW explained how insurance will pay for stay, patient and his wife had some more medical questions, CSW informed bedside nurse that the wife had some questions, and wife wanted to speak to the nurse.  CSW was given permission to begin bed search in [facility].  Patient expressed he would like either [facility] or [facility] of .  Patient and his wife did not  have any other questions or concerns.    Employment status:  Retired Nurse, adult PT Recommendations:  [place] / Referral to community resources:  [place]  Patient/Family's Response to care:  Patient and family are in agreement to going to SNF for short term rehab.  Patient/Family's Understanding of and Emotional Response to Diagnosis, Current Treatment, and Prognosis:  Patient and family are aware of current treatment plan.  Emotional Assessment Appearance:  Appears stated age Attitude/Demeanor/Rapport:    Affect (typically observed):  Appropriate, Calm, Stable Orientation:  Oriented to Self, Oriented to Place, Oriented to  Time, Oriented to Situation Alcohol / Substance use:  Not Applicable Psych involvement (Current and /or in the community):  No (Comment)  Discharge Needs  Concerns to be addressed:  Lack of Support, Care Coordination Readmission within the last 30 days:  No Current discharge risk:  Lack of support system Barriers to Discharge:  Continued Medical Work up   Ross Ludwig, [place] 01/02/2018, 5:30 PM

## 2018-01-02 NOTE — Evaluation (Signed)
Physical Therapy Evaluation Patient Details Name: Darryl Novak MRN: [id number] DOB: Feb 03, 1926 Today's Date: 01/02/2018   History of Present Illness  Pt is a 84 y.o. 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   Presented back to the emergency room today for weakness.  Wife unable to take care of him at home.  Used to walk with the help of a walker but after becoming blind in the left eye patient has not been ambulating well.  Workup in the emergency room room showed low sodium level.  CT head no acute abnormality.  Patient has been compliant with his Valtrex medication.  No history of fall, head injury.  Assessment includes: FTT, hyponatremia, dehydration, ambulatory dysfunction, and h/o herpes zoster with retinal necrosis and L eye blindness.     Clinical Impression  Pt presents with deficits in strength, transfers, mobility, gait, balance, and activity tolerance.  Pt required min A during bed mobility tasks for BLEs in and out of bed.  Pt was unsteady during transfers and amb requiring min A at times to prevent LOB.  Pt fatigued quickly during amb and was only able to amb 10' before requiring to return to sitting.  The pt's HR and SpO2 were WNL during session with pt reporting no adverse symptoms other than fatigue and BLEs feeling weak and unsteady.  Pt will benefit from PT services in a SNF setting upon discharge to safely address above deficits for decreased caregiver assistance and eventual return to PLOF.      Follow Up Recommendations SNF;Supervision for mobility/OOB    Equipment Recommendations  None recommended by PT    Recommendations for Other Services       Precautions / Restrictions Precautions Precautions: Fall Precaution Comments: Pt blind in L eye and very limited vision in the  R eye Restrictions Weight Bearing Restrictions: No      Mobility  Bed Mobility Overal bed mobility: Needs Assistance Bed Mobility: Supine to Sit;Sit to Supine     Supine to sit: Min assist Sit to supine: Min assist   General bed mobility comments: Min A for BLEs in and out of bed along with verbal cues for sequening and HOB elevated   Transfers Overall transfer level: Needs assistance Equipment used: Rolling walker (2 wheeled) Transfers: Sit to/from Stand Sit to Stand: Min assist         General transfer comment: Unsteady upon initial stand requiring min A to prevent LOB  Ambulation/Gait Ambulation/Gait assistance: Min assist Ambulation Distance (Feet): 10 Feet Assistive device: Rolling walker (2 wheeled) Gait Pattern/deviations: Step-to pattern;Trunk flexed   Gait velocity interpretation: <1.8 ft/sec, indicative of risk for recurrent falls General Gait Details: Pt required min A for stability during amb and fatigued quickly with max amb distance 10'; HR and SpO2 on room air WNL.  Stairs            Wheelchair Mobility    Modified Rankin (Stroke Patients Only)       Balance Overall balance assessment: History of Falls;Needs assistance Sitting-balance support: Feet supported;Bilateral upper extremity supported Sitting balance-Leahy Scale: Good     Standing balance support: Bilateral upper extremity supported Standing balance-Leahy Scale: Poor Standing balance comment: Occasional min A required in standing and during amb for stability  Pertinent Vitals/Pain Pain Assessment: No/denies pain    Home Living Family/patient expects to be discharged to:: Private residence Living Arrangements: Spouse/significant other Available Help at Discharge: Family;Personal care attendant(PCA 7x/wk for 8 hrs) Type of Home: House Home Access: Stairs to enter Entrance Stairs-Rails: Left Entrance Stairs-Number of Steps: 2 Home  Layout: One level Home Equipment: Grab bars - tub/shower;Walker - 4 wheels;Toilet riser;Wheelchair - [employer] - 2 wheels      Prior Function Level of Independence: Needs assistance   Gait / Transfers Assistance Needed: Mod Ind household amb with a RW with occasional SBA with PCA, 2 falls in the last 6 months with BLEs "just giving way"  ADL's / Homemaking Assistance Needed: PCA 7 days/wk for 8 hours/day assisting with all ADLs         Hand Dominance   Dominant Hand: Right    Extremity/Trunk Assessment   Upper Extremity Assessment Upper Extremity Assessment: Overall WFL for tasks assessed    Lower Extremity Assessment Lower Extremity Assessment: Generalized weakness       Communication   Communication: HOH  Cognition Arousal/Alertness: Awake/alert Behavior During Therapy: WFL for tasks assessed/performed Overall Cognitive Status: Within Functional Limits for tasks assessed                                        General Comments      Exercises Total Joint Exercises Ankle Circles/Pumps: AROM;Both;10 reps Quad Sets: Strengthening;Both;10 reps Gluteal Sets: Strengthening;Both;10 reps Hip ABduction/ADduction: AAROM;Both;10 reps Straight Leg Raises: AAROM;Both;10 reps Long Arc Quad: AROM;Both;10 reps Knee Flexion: AROM;Both;10 reps Marching in Standing: AROM;Both;Standing;5 reps   Assessment/Plan    PT Assessment Patient needs continued PT services  PT Problem List Decreased strength;Decreased activity tolerance;Decreased balance;Decreased knowledge of use of DME;Decreased mobility       PT Treatment Interventions Gait training;Therapeutic activities;Therapeutic exercise;Balance training    PT Goals (Current goals can be found in the Care Plan section)  Acute Rehab PT Goals Patient Stated Goal: Improved strength PT Goal Formulation: With patient Time For Goal Achievement: 01/15/18 Potential to Achieve Goals: Good    Frequency Min  2X/week   Barriers to discharge Decreased caregiver support;Inaccessible home environment      Co-evaluation               AM-PAC PT "6 Clicks" Daily Activity  Outcome Measure Difficulty turning over in bed (including adjusting bedclothes, sheets and blankets)?: Unable Difficulty moving from lying on back to sitting on the side of the bed? : Unable Difficulty sitting down on and standing up from a chair with arms (e.g., wheelchair, bedside commode, etc,.)?: Unable Help needed moving to and from a bed to chair (including a wheelchair)?: A Little Help needed walking in hospital room?: A Lot Help needed climbing 3-5 steps with a railing? : A Lot 6 Click Score: 10    End of Session Equipment Utilized During Treatment: Gait belt Activity Tolerance: Patient limited by fatigue Patient left: in bed;with bed alarm set;with call bell/phone within reach Nurse Communication: Mobility status PT Visit Diagnosis: Unsteadiness on feet (R26.81);History of falling (Z91.81);Muscle weakness (generalized) (M62.81);Difficulty in walking, not elsewhere classified (R26.2)    Time: 7253-6644 PT Time Calculation (min) (ACUTE ONLY): 25 min   Charges:   PT Evaluation $PT Eval Low Complexity: 1 Low PT Treatments $Therapeutic Exercise: 8-22 mins   PT G Codes:        D. Nicki Reaper  Kashon Kraynak PT, DPT 01/02/18, 1:19 PM

## 2018-01-02 NOTE — Care Management Obs Status (Signed)
[facility] NOTIFICATION   Patient Details  Name: Darryl Novak MRN: [id number] Date of Birth: 04/11/1926   Medicare Observation Status Notification Given:  Yes    Shelbie Ammons, RN 01/02/2018, 8:09 AM

## 2018-01-02 NOTE — Consult Note (Addendum)
Consultation Note Date: 01/02/2018   Patient Name: Darryl Novak  DOB: 1926/04/13  MRN: [id number]  Age / Sex: 84 y.o., male  PCP: Baxter Hire, MD Referring Physician: Bettey Costa, MD  Reason for Consultation: Establishing goals of care  HPI/Patient Profile: Darryl Novak  is a 84 y.o. 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  He states he was walking down the hall and felt like his legs just gave out on him.   Clinical Assessment and Goals of Care: Patient resting in bed. He states he has 15% vision in his right eye and no vision in his left eye. He lives at home with his wife of 52 years and has 2 children. He states she helps him with things due to his blindness. He uses a walker and wheelchair at home. He feeds himself, she helps with dressing, bathing, and properly sitting on toilet.  A discussion was had today regarding advanced directives.  Concepts specific to code status, artifical feeding and hydration,  IV antibiotics and rehospitalization was had.  The difference between an aggressive medical intervention path  and a comfort care path for this patient at this time was discussed.  Values and goals of care important to patient and family were attempted to be elicited.  He states he has a living will, which is noted in the chart. He tells me he would never want a feeding tube, intubation, or resuscitation. DNR status already in place.  He states he would like to continue to treat things that are treatable.   His wife voiced concern that he was walking down the hall with PT at home prior to Saturday. She states she does not feel that the underlying problem for his weakness is being addressed, and  that she cannot manage caring for him at home if he is unable to walk.    SUMMARY OF RECOMMENDATIONS    Recommend palliative to follow at discharge.   Code Status/Advance Care Planning:  DNR    Symptom Management:   Per primary team.   Palliative Prophylaxis:   Eye Care and Oral Care  Prognosis:   Unable to determine  Discharge Planning: To Be Determined      Primary Diagnoses: Present on Admission: . Adult failure to thrive . Fever   I have reviewed the medical record, interviewed the patient and family, and examined the patient. The following aspects are pertinent.  Past Medical History:  Diagnosis Date  . Heart trouble   . Hypertension   . MI (myocardial infarction) ([place]) 1978  . Neuropathy   . Shingles   . Stroke ([place])   . Ulcer    Social History   Socioeconomic History  . Marital status: Married    Spouse name: Not on file  . Number of children: Not on file  . Years of education: Not on  file  . Highest education level: Not on file  Occupational History  . Occupation: retired  Scientific laboratory technician  . Financial resource strain: Not on file  . Food insecurity:    Worry: Not on file    Inability: Not on file  . Transportation needs:    Medical: Not on file    Non-medical: Not on file  Tobacco Use  . Smoking status: Former Smoker    Types: Cigarettes  . Smokeless tobacco: Never Used  . Tobacco comment: quit 45 years ago  Substance and Sexual Activity  . Alcohol use: Yes    Comment: 2 drinks a night  . Drug use: No  . Sexual activity: Not Currently  Lifestyle  . Physical activity:    Days per week: Not on file    Minutes per session: Not on file  . Stress: Not on file  Relationships  . Social connections:    Talks on phone: Not on file    Gets together: Not on file    Attends religious service: Not on file    Active member of club or organization: Not on file    Attends meetings of clubs or organizations: Not on file    Relationship status:  Not on file  Other Topics Concern  . Not on file  Social History Narrative  . Not on file   Family History  Problem Relation Age of Onset  . Heart attack Father    Scheduled Meds: . apixaban  5 mg Oral BID  . cholecalciferol  400 Units Oral Daily  . feeding supplement (ENSURE ENLIVE)  237 mL Oral TID BM  . gabapentin  600 mg Oral QHS  . multivitamin with minerals  1 tablet Oral Daily  . polyethylene glycol  17 g Oral Daily  . simvastatin  40 mg Oral QHS  . valACYclovir  1,000 mg Oral BID   Continuous Infusions: PRN Meds:.acetaminophen **OR** acetaminophen, ondansetron **OR** ondansetron (ZOFRAN) IV, zolpidem Medications Prior to Admission:  Prior to Admission medications   Medication Sig Start Date End Date Taking? Authorizing Provider  ALPHA-LIPOIC ACID PO Take 300 mg by mouth daily.   Yes [provider]  apixaban (ELIQUIS) 5 MG TABS tablet Take 5 mg by mouth 2 (two) times daily.    Yes [provider]  Biotin 1000 MCG tablet Take 1,000 mcg by mouth daily.   Yes [provider]  cholecalciferol (VITAMIN D) 400 units TABS tablet Take 400 Units by mouth daily.   Yes [provider]  Coenzyme Q10 (CO Q-10) 100 MG CAPS Take 100 mg by mouth daily.    Yes [provider]  feeding supplement, ENSURE ENLIVE, (ENSURE ENLIVE) LIQD Take 237 mLs by mouth 3 (three) times daily between meals. 11/19/17  Yes Vaughan Basta, MD  gabapentin (NEURONTIN) 600 MG tablet Take 600 mg by mouth at bedtime.   Yes [provider]  Multiple Vitamin (MULTIVITAMIN) tablet Take 1 tablet by mouth daily.   Yes [provider]  polyethylene glycol (MIRALAX / GLYCOLAX) packet Take 17 g by mouth daily. 11/19/17  Yes Vaughan Basta, MD  simvastatin (ZOCOR) 40 MG tablet Take 40 mg by mouth at bedtime.   Yes [provider]  valACYclovir (VALTREX) 1000 MG tablet Take 1,000 mg by mouth 2 (two) times daily.   Yes [provider]  predniSONE (DELTASONE) 20 MG tablet Take 0.5 tablets (10 mg total) by mouth daily. Patient not taking: Reported on 12/31/2017 12/28/17   Mody,  Sital, MD   Allergies  Allergen Reactions  . Ace Inhibitors Other (See Comments)    Intolerant   Review of Systems  Eyes: Positive for visual disturbance.  Neurological: Positive for weakness.    Physical Exam  Constitutional: No distress.  Pulmonary/Chest: Effort normal.  Abdominal: Soft.  Neurological: He is alert.  Oriented  Skin: Skin is warm and dry.    Vital Signs: BP 119/63 (BP Location: Left Arm)   Pulse 85   Temp 97.9 F (36.6 C) (Oral)   Resp 16   Ht 5' 7.5" (1.715 m)   Wt 68 kg (150 lb)   SpO2 99%   BMI 23.15 kg/m  Pain Scale: 0-10   Pain Score: 0-No pain   SpO2: SpO2: 99 % O2 Device:SpO2: 99 % O2 Flow Rate: .O2 Flow Rate (L/min): 2 L/min  IO: Intake/output summary:   Intake/Output Summary (Last 24 hours) at 01/02/2018 1526 Last data filed at 01/02/2018 0430 Gross per 24 hour  Intake -  Output 200 ml  Net -200 ml    LBM: Last BM Date: 12/31/17 Baseline Weight: Weight: 68 kg (150 lb) Most recent weight: Weight: 68 kg (150 lb)     Palliative Assessment/Data: 40%     Time In: 2:50 Time Out: 3:40 Time Total: 50 min Greater than 50%  of this time was spent counseling and coordinating care related to the above assessment and plan.  Signed by: Asencion Gowda, NP   Please contact Palliative Medicine Team phone at [phone] for questions and concerns.  For individual provider: See Shea Evans

## 2018-01-02 NOTE — Care Management Note (Signed)
Case Management Note  Patient Details  Name: Darryl Novak MRN: [id number] Date of Birth: April 10, 1926  Subjective/Objective:     Admitted to [facility] with the diagnosis of adult failure to thrive. Lives with wife. Daughter is Manuela Schwartz [phone]). Last seem Dr, Wynetta Emery 2 weeks ago. Prescriptions are filled at [facility]. Followed by [facility] in the home. No skilled nursing. No home oxygen. [place] and wheelchair in the home. Self feed. Needs help with baths and dressing. Last fall was 2 weeks ago. Fair appetite.                 Action/Plan: Will continue to follow for discharge plans.   Expected Discharge Date:                  Expected Discharge Plan:     In-House Referral:     Discharge planning Services     Post Acute Care Choice:    Choice offered to:     DME Arranged:    DME Agency:     HH Arranged:    HH Agency:     Status of Service:     If discussed at [employer], dates discussed:    Additional Comments:  Shelbie Ammons, RN MSN CCM Care Management [phone] 01/02/2018, 8:10 AM

## 2018-01-02 NOTE — NC FL2 (Signed)
[place] LEVEL OF CARE SCREENING TOOL     IDENTIFICATION  Patient Name: Darryl Novak Birthdate: Dec 22, 1925 Sex: male Admission Date (Current Location): 12/31/2017  [place] and [place] Number:  Engineering geologist and Address:  [facility], [address], [place], [place] [address]      Provider Number: [id number]  Attending Physician Name and Address:  Bettey Costa, MD  Relative Name and Phone Number:  [name],[name] Spouse [phone] or [name], [name]   [phone]     Current Level of Care: Hospital Recommended Level of Care: [place] Prior Approval Number:    Date Approved/Denied:   PASRR Number: [id number]  Discharge Plan: Home    Current Diagnoses: Patient Active Problem List   Diagnosis Date Noted  . Fever 01/02/2018  . Adult failure to thrive 12/31/2017  . Weakness generalized 12/27/2017  . Hypotension 12/27/2017  . Decubital ulcer 12/27/2017  . 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 Acute renal failure (ARF) ([place]) 11/13/2017  . Chronic atrial fibrillation ([place]) 04/10/2016  . Obstructive sleep apnea syndrome 10/17/2015  . Cerebrovascular accident, old 09/19/2014  . Arteriosclerosis of coronary artery 03/28/2014  . HLD (hyperlipidemia) 03/28/2014  . BP (high blood pressure) 03/28/2014  . [place]ison anemia 03/28/2014  . Peripheral vascular disease ([place]) 03/28/2014  . Cerebrovascular accident (CVA) ([place]) 03/28/2014    Orientation RESPIRATION BLADDER Height & Weight     Time, Situation, Self, Place  O2(2L) Incontinent Weight: 150 lb (68 kg) Height:  5' 7.5" (171.5 cm)  BEHAVIORAL SYMPTOMS/MOOD NEUROLOGICAL BOWEL NUTRITION STATUS      Incontinent Diet(Cardiac)  AMBULATORY STATUS COMMUNICATION OF NEEDS Skin   Limited Assist Verbally Skin abrasions                       Personal Care Assistance Level of Assistance  Bathing, Feeding, Dressing Bathing Assistance: Limited  assistance Feeding assistance: Maximum assistance Dressing Assistance: Limited assistance     Functional Limitations Info  Sight, Hearing, Speech Sight Info: Adequate Hearing Info: Adequate Speech Info: Adequate    SPECIAL CARE FACTORS FREQUENCY  PT (By licensed PT)     PT Frequency: 5x a week              Contractures Contractures Info: Not present    Additional Factors Info  Code Status, Allergies Code Status Info: DNR Allergies Info: ACE INHIBITORS            Current Medications (01/02/2018):  This is the current hospital active medication list Current Facility-Administered Medications  Medication Dose Route Frequency Provider Last Rate Last Dose  . acetaminophen (TYLENOL) tablet 650 mg  650 mg Oral Q6H PRN Saundra Shelling, MD   650 mg at 01/01/18 2111   Or  . acetaminophen (TYLENOL) suppository 650 mg  650 mg Rectal Q6H PRN Saundra Shelling, MD      . apixaban (ELIQUIS) tablet 5 mg  5 mg Oral BID Saundra Shelling, MD   5 mg at 01/02/18 0852  . cholecalciferol (VITAMIN D) tablet 400 Units  400 Units Oral Daily Saundra Shelling, MD   400 Units at 01/02/18 [phone]  . feeding supplement (ENSURE ENLIVE) (ENSURE ENLIVE) liquid 237 mL  237 mL Oral TID BM Pyreddy, Reatha Harps, MD   237 mL at 01/02/18 0852  . gabapentin (NEURONTIN) tablet 600 mg  600 mg Oral QHS Pyreddy, Reatha Harps, MD   600 mg at 01/01/18 2110  . multivitamin with minerals tablet 1 tablet  1 tablet Oral Daily Saundra Shelling, MD   1 tablet at 01/02/18 [phone]  . ondansetron (ZOFRAN) tablet 4 mg  4 mg Oral Q6H PRN Pyreddy, Reatha Harps, MD       Or  . ondansetron (ZOFRAN) injection 4 mg  4 mg Intravenous Q6H PRN Pyreddy, Pavan, MD      . polyethylene glycol (MIRALAX / GLYCOLAX) packet 17 g  17 g Oral Daily Pyreddy, Reatha Harps, MD   17 g at 01/02/18 0853  . simvastatin (ZOCOR) tablet 40 mg  40 mg Oral QHS Pyreddy, Reatha Harps, MD   40 mg at 01/01/18 2110  . valACYclovir (VALTREX) tablet 1,000 mg  1,000 mg Oral BID Saundra Shelling, MD   1,000 mg at  01/02/18 1505  . zolpidem (AMBIEN) tablet 5 mg  5 mg Oral QHS PRN Lance Coon, MD   5 mg at 01/01/18 2111     Discharge Medications: Please see discharge summary for a list of discharge medications.  Relevant Imaging Results:  Relevant Lab Results:   Additional Information SS# [id number]  [name]

## 2018-01-03 LAB — CBC
HCT: 31.1 % — ABNORMAL LOW (ref 40.0–52.0)
Hemoglobin: 11.1 g/dL — ABNORMAL LOW (ref 13.0–18.0)
MCH: 36.5 pg — AB (ref 26.0–34.0)
MCHC: 35.7 g/dL (ref 32.0–36.0)
MCV: 102 fL — ABNORMAL HIGH (ref 80.0–100.0)
PLATELETS: 141 10*3/uL — AB (ref 150–440)
RBC: 3.05 MIL/uL — AB (ref 4.40–5.90)
RDW: 20.5 % — ABNORMAL HIGH (ref 11.5–14.5)
WBC: 7 10*3/uL (ref 3.8–10.6)

## 2018-01-03 NOTE — Care Management Important Message (Signed)
Important Message  Patient Details  Name: Darryl Novak MRN: [id number] Date of Birth: 02-May-1926   Medicare Important Message Given:  Yes  Signed IM notice given   Katrina Stack, RN 01/03/2018, 11:13 AM

## 2018-01-03 NOTE — Plan of Care (Signed)

## 2018-01-03 NOTE — Clinical Social Work Note (Addendum)
CSW is waiting for insurance authorization and bed offers for imminent discharge. CSW is following.  UPDATE: [facility] has no availability until Monday. [facility] only has a male bed until Monday. [facility] has no availability until Tuesday. The patient's family has declined [facility] [place] and [facility]. [facility] has no availability. [facility] has no availability. CSW will continue to pursue [facility] and update as more information is available.  UPDATE 11:20 am: [facility] has [facility] bed available. [facility] authorization is pending. CSW will update as authorization arrives.  UPDATE: [facility] has begun the auth process and will have an update by Monday. Attending RN and MD aware. CSW is following.  Santiago Bumpers, MSW, Latanya Presser [phone]

## 2018-01-03 NOTE — Discharge Summary (Signed)
[facility] at [place] NAME: Darryl Novak    MR#:  [id number]  DATE OF BIRTH:  04/12/1926  DATE OF ADMISSION:  12/31/2017 ADMITTING PHYSICIAN: Saundra Shelling, MD  DATE OF DISCHARGE: 01/03/2018  PRIMARY CARE PHYSICIAN: Baxter Hire, MD    ADMISSION DIAGNOSIS:  General weakness [R53.1]  DISCHARGE DIAGNOSIS:  Active Problems:   Adult failure to thrive   Fever   SECONDARY DIAGNOSIS:   Past Medical History:  Diagnosis Date  . Heart trouble   . Hypertension   . MI (myocardial infarction) ([place]) 1978  . Neuropathy   . Shingles   . Stroke ([place])   . Ulcer     HOSPITAL COURSE:   84 year old male currently being treated forretinalherpes zoster,sinus and left eye who presents with generalized weakness.   1. Generalized weakness with adult failure to thrive Physical therapy is recommending skilled nursing facility . 2.   Hyponatremia from poor p.o. intake and dehydration Patient likely has component of hyponatremia which is chronic  3. Hypotension from dehydration which is improved.   Blood pressure improved with IV fluids  4. History of herpes zoster with retinal involvement: Continue valacyclovir  5.  Hypokalemia: This was repleted  6.  Fever: Chest x-ray is negative.  UA was negative.  No fevers overnight.     DISCHARGE CONDITIONS AND DIET:  Stable Regular diet  CONSULTS OBTAINED:    DRUG ALLERGIES:   Allergies  Allergen Reactions  . Ace Inhibitors Other (See Comments)    Intolerant    DISCHARGE MEDICATIONS:   Allergies as of 01/03/2018      Reactions   Ace Inhibitors Other (See Comments)   Intolerant      Medication List    STOP taking these medications   predniSONE 20 MG tablet Commonly known as:  DELTASONE     TAKE these medications   ALPHA-LIPOIC ACID PO Take 300 mg by mouth daily.   Biotin 1000 MCG tablet Take 1,000 mcg by mouth daily.   cholecalciferol 400 units Tabs  tablet Commonly known as:  VITAMIN D Take 400 Units by mouth daily.   Co Q-10 100 MG Caps Take 100 mg by mouth daily.   ELIQUIS 5 MG Tabs tablet Generic drug:  apixaban Take 5 mg by mouth 2 (two) times daily.   feeding supplement (ENSURE ENLIVE) Liqd Take 237 mLs by mouth 3 (three) times daily between meals.   gabapentin 600 MG tablet Commonly known as:  NEURONTIN Take 600 mg by mouth at bedtime.   multivitamin tablet Take 1 tablet by mouth daily.   polyethylene glycol packet Commonly known as:  MIRALAX / GLYCOLAX Take 17 g by mouth daily.   simvastatin 40 MG tablet Commonly known as:  ZOCOR Take 40 mg by mouth at bedtime.   VALTREX 1000 MG tablet Generic drug:  valACYclovir Take 1,000 mg by mouth 2 (two) times daily.         Today   CHIEF COMPLAINT:  No issues overnight   VITAL SIGNS:  Blood pressure 111/61, pulse 99, temperature 98.2 F (36.8 C), resp. rate 20, height 5' 7.5" (1.715 m), weight 68 kg (150 lb), SpO2 95 %.   REVIEW OF SYSTEMS:  Review of Systems  Constitutional: Negative.  Negative for chills, fever and malaise/fatigue.  HENT: Negative for ear discharge, ear pain, hearing loss, nosebleeds and sore throat.        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  Negative for  blurred vision and pain.  Respiratory: Negative.  Negative for cough, hemoptysis, shortness of breath and wheezing.   Cardiovascular: Negative.  Negative for chest pain, palpitations and leg swelling.  Gastrointestinal: Negative.  Negative for abdominal pain, blood in stool, diarrhea, nausea and vomiting.  Genitourinary: Negative.  Negative for dysuria.  Musculoskeletal: Negative.  Negative for back pain.  Skin: Negative.   Neurological: Negative for dizziness, tremors, speech change, focal weakness, seizures and headaches.  Endo/Heme/Allergies: Negative.  Does not bruise/bleed easily.  Psychiatric/Behavioral: Negative.  Negative for depression, hallucinations and suicidal  ideas.     PHYSICAL EXAMINATION:  GENERAL:  84 y.o.-year-old patient lying in the bed with no acute distress.  NECK:  Supple, no jugular venous distention. No thyroid enlargement, no tenderness.  LUNGS: Normal breath sounds bilaterally, no wheezing, rales,rhonchi  No use of accessory muscles of respiration.  CARDIOVASCULAR: S1, S2 normal. No murmurs, rubs, or gallops.  ABDOMEN: Soft, non-tender, non-distended. Bowel sounds present. No organomegaly or mass.  EXTREMITIES: No pedal edema, cyanosis, or clubbing.  PSYCHIATRIC: The patient is alert and oriented x 3.  SKIN: No obvious rash, lesion, or ulcer.   DATA REVIEW:   CBC Recent Labs  Lab 01/03/18 0455  WBC 7.0  HGB 11.1*  HCT 31.1*  PLT 141*    Chemistries  Recent Labs  Lab 12/28/17 0711  01/02/18 0449  NA 130*   < > 130*  K 4.0   < > 3.5  CL 100*   < > 97*  CO2 23   < > 26  GLUCOSE 151*   < > 112*  BUN 19   < > 18  CREATININE 1.00   < > 0.83  CALCIUM 7.6*   < > 7.9*  MG 1.6*  --   --   AST 26  --   --   ALT 18  --   --   ALKPHOS 41  --   --   BILITOT 1.2  --   --    < > = values in this interval not displayed.    Cardiac Enzymes Recent Labs  Lab 12/27/17 1131 12/28/17 0711 12/31/17 1432  TROPONINI 0.04* 0.03* 0.05*    Microbiology Results  @[email or web address] @  RADIOLOGY:  Dg Chest 1 View  Result Date: 01/02/2018 CLINICAL DATA:  84 year old male with a history of fever EXAM: CHEST  1 VIEW COMPARISON:  12/31/2017, 12/27/2017 FINDINGS: Cardiomediastinal silhouette unchanged in size and contour. No evidence of central vascular congestion. No pneumothorax or pleural effusion. No confluent airspace disease. Similar appearance of interstitial prominence, similar to the prior. Degenerative changes of the shoulders. No displaced fracture IMPRESSION: Similar appearance of chronic lung changes without evidence of superimposed acute cardiopulmonary disease. Electronically Signed   By: Corrie Mckusick D.O.   On: 01/02/2018  09:55      Allergies as of 01/03/2018      Reactions   Ace Inhibitors Other (See Comments)   Intolerant      Medication List    STOP taking these medications   predniSONE 20 MG tablet Commonly known as:  DELTASONE     TAKE these medications   ALPHA-LIPOIC ACID PO Take 300 mg by mouth daily.   Biotin 1000 MCG tablet Take 1,000 mcg by mouth daily.   cholecalciferol 400 units Tabs tablet Commonly known as:  VITAMIN D Take 400 Units by mouth daily.   Co Q-10 100 MG Caps Take 100 mg by mouth daily.   ELIQUIS 5 MG Tabs tablet Generic  drug:  apixaban Take 5 mg by mouth 2 (two) times daily.   feeding supplement (ENSURE ENLIVE) Liqd Take 237 mLs by mouth 3 (three) times daily between meals.   gabapentin 600 MG tablet Commonly known as:  NEURONTIN Take 600 mg by mouth at bedtime.   multivitamin tablet Take 1 tablet by mouth daily.   polyethylene glycol packet Commonly known as:  MIRALAX / GLYCOLAX Take 17 g by mouth daily.   simvastatin 40 MG tablet Commonly known as:  ZOCOR Take 40 mg by mouth at bedtime.   VALTREX 1000 MG tablet Generic drug:  valACYclovir Take 1,000 mg by mouth 2 (two) times daily.         Management plans discussed with the patient and he is in agreement. Stable for discharge snf  Patient should follow up with pcp  CODE STATUS:     Code Status Orders  (From admission, onward)        Start     Ordered   12/31/17 1700  Do not attempt resuscitation (DNR)  Continuous    Question Answer Comment  In the event of cardiac or respiratory ARREST Do not call a "code blue"   In the event of cardiac or respiratory ARREST Do not perform Intubation, CPR, defibrillation or ACLS   In the event of cardiac or respiratory ARREST Use medication by any route, position, wound care, and other measures to relive pain and suffering. May use oxygen, suction and manual treatment of airway obstruction as needed for comfort.      12/31/17 1659    Code  Status History    Date Active Date Inactive Code Status Order ID Comments User Context   12/27/2017 1730 12/28/2017 1948 Full Code [id number]  Idelle Crouch, MD Inpatient   11/13/2017 2025 11/19/2017 1744 Full Code [id number]  Henreitta Leber, MD Inpatient    Advance Directive Documentation     Most Recent Value  Type of Advance Directive  Out of facility DNR (pink MOST or yellow form)  Pre-existing out of facility DNR order (yellow form or pink MOST form)  -  "MOST" Form in Place?  -      TOTAL TIME TAKING CARE OF THIS PATIENT: 38 minutes.    Note: This dictation was prepared with Dragon dictation along with smaller phrase technology. Any transcriptional errors that result from this process are unintentional.  Breezie Micucci M.D on 01/03/2018 at 10:37 AM  Between 7am to 6pm - Pager - [phone] After 6pm go to [email or web address] - password [email or web address] [facility]  Office  [phone]  CC: Primary care physician; Baxter Hire, MD

## 2018-01-03 NOTE — Progress Notes (Signed)
[facility] [place] NAME: Darryl Novak    MR#:  [id number]  DATE OF BIRTH:  1926-06-18  SUBJECTIVE:  No acute issues overnight.  No fever overnight.   REVIEW OF SYSTEMS:    Review of Systems  Constitutional: No fever no chills weight loss +generalized weakness  HENT: Negative for ear pain, nosebleeds, congestion, facial swelling, rhinorrhea, neck pain, neck stiffness and ear discharge.   Blindness in right eye Respiratory: Negative for cough, shortness of breath, wheezing  Cardiovascular: Negative for chest pain, palpitations and leg swelling.  Gastrointestinal: Negative for heartburn, abdominal pain, vomiting, diarrhea or consitpation Genitourinary: Negative for dysuria, urgency, frequency, hematuria Musculoskeletal: Negative for back pain or joint pain Neurological: Negative for dizziness, seizures, syncope, focal weakness,  numbness and headaches.  Hematological: Does not bruise/bleed easily.  Psychiatric/Behavioral: Negative for hallucinations, confusion, dysphoric mood    Tolerating Diet: yes      DRUG ALLERGIES:   Allergies  Allergen Reactions  . Ace Inhibitors Other (See Comments)    Intolerant    VITALS:  Blood pressure 111/61, pulse 99, temperature 98.2 F (36.8 C), resp. rate 20, height 5' 7.5" (1.715 m), weight 68 kg (150 lb), SpO2 95 %.  PHYSICAL EXAMINATION:  Constitutional: Appears well-developed and well-nourished. No distress. HENT: Normocephalic. Marland Kitchen Oropharynx is clear and moist.  Eyes: Conjunctivae  are normal. , no scleral icterus.  Neck: Normal ROM. Neck supple. No JVD. No tracheal deviation. CVS: RRR, S1/S2 +, no murmurs, no gallops, no carotid bruit.  Pulmonary: Effort and breath sounds normal, no stridor, rhonchi, wheezes, rales.  Abdominal: Soft. BS +,  no distension, tenderness, rebound or guarding.  Musculoskeletal: Normal range of motion. No edema and no tenderness.  Neuro: Alert. CN 2-12  grossly intact. No focal deficits. Skin: Skin is warm and dry. No rash noted. Psychiatric: Normal mood and affect.      LABORATORY PANEL:   CBC Recent Labs  Lab 01/03/18 0455  WBC 7.0  HGB 11.1*  HCT 31.1*  PLT 141*   ------------------------------------------------------------------------------------------------------------------  Chemistries  Recent Labs  Lab 12/28/17 0711  01/02/18 0449  NA 130*   < > 130*  K 4.0   < > 3.5  CL 100*   < > 97*  CO2 23   < > 26  GLUCOSE 151*   < > 112*  BUN 19   < > 18  CREATININE 1.00   < > 0.83  CALCIUM 7.6*   < > 7.9*  MG 1.6*  --   --   AST 26  --   --   ALT 18  --   --   ALKPHOS 41  --   --   BILITOT 1.2  --   --    < > = values in this interval not displayed.   ------------------------------------------------------------------------------------------------------------------  Cardiac Enzymes Recent Labs  Lab 12/27/17 1131 12/28/17 0711 12/31/17 1432  TROPONINI 0.04* 0.03* 0.05*   ------------------------------------------------------------------------------------------------------------------  RADIOLOGY:  Dg Chest 1 View  Result Date: 01/02/2018 CLINICAL DATA:  84 year old male with a history of fever EXAM: CHEST  1 VIEW COMPARISON:  12/31/2017, 12/27/2017 FINDINGS: Cardiomediastinal silhouette unchanged in size and contour. No evidence of central vascular congestion. No pneumothorax or pleural effusion. No confluent airspace disease. Similar appearance of interstitial prominence, similar to the prior. Degenerative changes of the shoulders. No displaced fracture IMPRESSION: Similar appearance of chronic lung changes without evidence of superimposed acute cardiopulmonary disease. Electronically Signed   By:  Corrie Mckusick D.O.   On: 01/02/2018 09:55     ASSESSMENT AND PLAN:  84 year old male currently being treated for retinal herpes zoster, sinus and left eye who presents with generalized weakness.   1.  Generalized  weakness with adult failure to thrive Physical therapy is recommending skilled nursing facility . 2.    Hyponatremia from poor p.o. intake and dehydration Patient likely has component of hyponatremia which is chronic  3.  Hypotension from dehydration which is improved.    Blood pressure improved with IV fluids  4.  History of herpes zoster with retinal involvement: Continue valacyclovir  5.  Hypokalemia: This was repleted  6.  Fever: Chest x-ray is negative.  UA was negative.  No fevers overnight.  Patient will need skilled facility at discharge          Management plans discussed with the patient and he is in agreement  CODE STATUS: DNR  TOTAL TIME TAKING CARE OF THIS PATIENT: 24 minutes.     POSSIBLE D/C today DEPENDING ON CLINICAL CONDITION.   Birtie Fellman M.D on 01/03/2018 at 10:32 AM  Between 7am to 6pm - Pager - [phone] After 6pm go to [email or web address] - password [email or web address] [facility]  Office  [phone]  CC: Primary care physician; Baxter Hire, MD  Note: This dictation was prepared with Dragon dictation along with smaller phrase technology. Any transcriptional errors that result from this process are unintentional.

## 2018-01-04 LAB — BASIC METABOLIC PANEL
Anion gap: 9 (ref 5–15)
BUN: 15 mg/dL (ref 6–20)
CALCIUM: 7.8 mg/dL — AB (ref 8.9–10.3)
CO2: 26 mmol/L (ref 22–32)
Chloride: 89 mmol/L — ABNORMAL LOW (ref 101–111)
Creatinine, Ser: 0.74 mg/dL (ref 0.61–1.24)
GFR calc Af Amer: >60 mL/min (ref >60)
GLUCOSE: 108 mg/dL — AB (ref 65–99)
POTASSIUM: 3.5 mmol/L (ref 3.5–5.1)
Sodium: 124 mmol/L — ABNORMAL LOW (ref 135–145)

## 2018-01-04 MED ORDER — SODIUM CHLORIDE 0.9 % IV SOLN
INTRAVENOUS | Status: AC
  Administered 2018-01-04 (×2): via INTRAVENOUS

## 2018-01-04 NOTE — Care Management (Signed)
Notified [facility] that patient is for placement in SNF

## 2018-01-04 NOTE — Progress Notes (Signed)
[facility] at [place] NAME: Darryl Novak    MR#:  [id number]  DATE OF BIRTH:  04/09/1926  SUBJECTIVE:   Patient doing well this morning.  Sodium level noted to be low. Wife is at bedside   REVIEW OF SYSTEMS:    Review of Systems  Constitutional: No fever no chills weight loss +generalized weakness  HENT: Negative for ear pain, nosebleeds, congestion, facial swelling, rhinorrhea, neck pain, neck stiffness and ear discharge.   Blindness in right eye Respiratory: Negative for cough, shortness of breath, wheezing  Cardiovascular: Negative for chest pain, palpitations and leg swelling.  Gastrointestinal: Negative for heartburn, abdominal pain, vomiting, diarrhea or consitpation Genitourinary: Negative for dysuria, urgency, frequency, hematuria Musculoskeletal: Negative for back pain or joint pain Neurological: Negative for dizziness, seizures, syncope, focal weakness,  numbness and headaches.  Hematological: Does not bruise/bleed easily.  Psychiatric/Behavioral: Negative for hallucinations, confusion, dysphoric mood    Tolerating Diet: yes      DRUG ALLERGIES:   Allergies  Allergen Reactions  . Ace Inhibitors Other (See Comments)    Intolerant    VITALS:  Blood pressure 128/60, pulse (!) 114, temperature 99.5 F (37.5 C), temperature source Oral, resp. rate (!) 21, height 5' 7.5" (1.715 m), weight 68 kg (150 lb), SpO2 95 %.  PHYSICAL EXAMINATION:  Constitutional: Appears well-developed and well-nourished. No distress. HENT: Normocephalic. Marland Kitchen Oropharynx is clear and moist.  Eyes: Conjunctivae  are normal. , no scleral icterus.  Neck: Normal ROM. Neck supple. No JVD. No tracheal deviation. CVS: RRR, S1/S2 +, no murmurs, no gallops, no carotid bruit.  Pulmonary: Effort and breath sounds normal, no stridor, rhonchi, wheezes, rales.  Abdominal: Soft. BS +,  no distension, tenderness, rebound or guarding.  Musculoskeletal: Normal  range of motion. No edema and no tenderness.  Neuro: Alert. CN 2-12 grossly intact. No focal deficits. Skin: Skin is warm and dry. No rash noted. Psychiatric: Normal mood and affect.      LABORATORY PANEL:   CBC Recent Labs  Lab 01/03/18 0455  WBC 7.0  HGB 11.1*  HCT 31.1*  PLT 141*   ------------------------------------------------------------------------------------------------------------------  Chemistries  Recent Labs  Lab 01/04/18 0521  NA 124*  K 3.5  CL 89*  CO2 26  GLUCOSE 108*  BUN 15  CREATININE 0.74  CALCIUM 7.8*   ------------------------------------------------------------------------------------------------------------------  Cardiac Enzymes Recent Labs  Lab 12/31/17 1432  TROPONINI 0.05*   ------------------------------------------------------------------------------------------------------------------  RADIOLOGY:  No results found.   ASSESSMENT AND PLAN:  84 year old male currently being treated for retinal herpes zoster, sinus and left eye who presents with generalized weakness.   1.  Generalized weakness with adult failure to thrive Physical therapy is recommending skilled nursing facility . 2.    Hyponatremia from poor p.o. intake and dehydration Patient likely has component of hyponatremia which is chronic Start IV fluids and repeat BMP in a.m.  3.  Hypotension from dehydration which is improved.    Blood pressure improved with IV fluids  4.  History of herpes zoster with retinal involvement: Continue valacyclovir  5.  Hypokalemia: This was repleted  6.  Fever: Chest x-ray is negative.  UA was negative.  No fevers overnight.  Patient will need skilled facility at discharge          Management plans discussed with the patient and wife and they are in agreement  CODE STATUS: DNR  TOTAL TIME TAKING CARE OF THIS PATIENT: 24 minutes.     POSSIBLE D/C  tomorrow to skilled nursing facility depending on sodium  level  [name] M.D on 01/04/2018 at 10:27 AM  Between 7am to 6pm - Pager - [phone] After 6pm go to [email or web address] - password [email or web address] [facility]  Office  [phone]  CC: Primary care physician; Baxter Hire, MD  Note: This dictation was prepared with Dragon dictation along with smaller phrase technology. Any transcriptional errors that result from this process are unintentional.

## 2018-01-04 NOTE — Progress Notes (Signed)
Pt's BP is low at 92/42. He denies any feelings of lightheadedness or dizziness. Dr. Benjie Karvonen notified, she stated no additional interventions needed at this time. Will notify her if pressure worsens or he becomes symptomatic.

## 2018-01-05 LAB — BASIC METABOLIC PANEL
ANION GAP: 4 — AB (ref 5–15)
BUN: 19 mg/dL (ref 6–20)
CALCIUM: 7.9 mg/dL — AB (ref 8.9–10.3)
CHLORIDE: 93 mmol/L — AB (ref 101–111)
CO2: 29 mmol/L (ref 22–32)
CREATININE: 0.7 mg/dL (ref 0.61–1.24)
GFR calc non Af Amer: >60 mL/min (ref >60)
Glucose, Bld: 127 mg/dL — ABNORMAL HIGH (ref 65–99)
Potassium: 3.5 mmol/L (ref 3.5–5.1)
Sodium: 126 mmol/L — ABNORMAL LOW (ref 135–145)

## 2018-01-05 MED ORDER — SODIUM CHLORIDE 0.9 % IV SOLN
INTRAVENOUS | Status: DC
  Administered 2018-01-05 (×2): via INTRAVENOUS

## 2018-01-05 NOTE — Progress Notes (Signed)
[facility] at [place] NAME: Darryl Novak    MR#:  [id number]  DATE OF BIRTH:  January 12, 1926  SUBJECTIVE:  CHIEF COMPLAINT:   Chief Complaint  Patient presents with  . Weakness   -denies any complaints. Sodium remains low  REVIEW OF SYSTEMS:  Review of Systems  Constitutional: Negative for chills, fever and malaise/fatigue.  HENT: Positive for hearing loss. Negative for congestion, ear discharge and nosebleeds.   Eyes: Positive for blurred vision.  Respiratory: Negative for cough, shortness of breath and wheezing.   Cardiovascular: Negative for chest pain and palpitations.  Gastrointestinal: Negative for abdominal pain, constipation, diarrhea, nausea and vomiting.  Genitourinary: Negative for dysuria.  Musculoskeletal: Negative for myalgias.  Neurological: Positive for weakness. Negative for dizziness, seizures and headaches.  Psychiatric/Behavioral: Negative for depression.    DRUG ALLERGIES:   Allergies  Allergen Reactions  . Ace Inhibitors Other (See Comments)    Intolerant    VITALS:  Blood pressure 120/65, pulse (!) 106, temperature 98.7 F (37.1 C), temperature source Oral, resp. rate (!) 22, height 5' 7.5" (1.715 m), weight 68 kg (150 lb), SpO2 97 %.  PHYSICAL EXAMINATION:  Physical Exam  GENERAL:  84 y.o.-year-old patient lying in the bed with no acute distress.  EYES: No scleral icterus. Extraocular muscles intact. Legally blind in both eyes HEENT: Head atraumatic, normocephalic. Oropharynx and nasopharynx clear.  NECK:  Supple, no jugular venous distention. No thyroid enlargement, no tenderness.  LUNGS: Normal breath sounds bilaterally, no wheezing, rales,rhonchi or crepitation. No use of accessory muscles of respiration.  CARDIOVASCULAR: S1, S2 normal. No rubs, or gallops. 2/6 systolic murmur is present ABDOMEN: Soft, nontender, nondistended. Bowel sounds present. No organomegaly or mass.  EXTREMITIES: No pedal  edema, cyanosis, or clubbing.  NEUROLOGIC: Cranial nerves II through XII are intact. Muscle strength 5/5 in all extremities. Sensation intact. Gait not checked. Global weakness noted PSYCHIATRIC: The patient is alert and oriented x 3.  SKIN: No obvious rash, lesion, or ulcer.    LABORATORY PANEL:   CBC Recent Labs  Lab 01/03/18 0455  WBC 7.0  HGB 11.1*  HCT 31.1*  PLT 141*   ------------------------------------------------------------------------------------------------------------------  Chemistries  Recent Labs  Lab 01/05/18 0421  NA 126*  K 3.5  CL 93*  CO2 29  GLUCOSE 127*  BUN 19  CREATININE 0.70  CALCIUM 7.9*   ------------------------------------------------------------------------------------------------------------------  Cardiac Enzymes Recent Labs  Lab 12/31/17 1432  TROPONINI 0.05*   ------------------------------------------------------------------------------------------------------------------  RADIOLOGY:  No results found.  EKG:   Orders placed or performed during the hospital encounter of 12/31/17  . ED EKG  . 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  1. Hyponatremia-likely hypovolemic hyponatremia. -Encourage oral intake. Currently receiving IV fluids. -repeat in a.m. Consider salt tablets tomorrow if no improvement - CXR with no lung nodules  2.generalized weakness-adult failure to thrive -Physical therapy recommending rehabilitation  3. History of herpes zoster with left retinal necrosis- continue valacyclovir  4. Low-grade fevers- unknown source, check dopplers to r/o DVT UA normal, CXR normal   To rehab once sodium improves   All the records are reviewed and case discussed with Care Management/Social Workerr. Management plans discussed with the  patient, family and they are in agreement.  CODE STATUS: DO NOT RESUSCITATE  TOTAL TIME TAKING CARE  OF THIS PATIENT: 37 minutes.   POSSIBLE D/C IN 1-2 DAYS, DEPENDING ON CLINICAL CONDITION.   Gladstone Lighter M.D on 01/05/2018 at 11:24 AM  Between 7am to 6pm - Pager - [phone]  After 6pm go to [email or web address] - password [email or web address] [facility]  Office  [phone]  CC: Primary care physician; Baxter Hire, MD

## 2018-01-05 NOTE — Progress Notes (Signed)
PT Cancellation Note  Patient Details Name: Darryl Novak MRN: [id number] DOB: [birth year]   Cancelled Treatment:    Reason Eval/Treat Not Completed: Medical issues which prohibited therapy; Per MD progress note pt to have tests to rule out BLE DVTs.  Will hold pt this date awaiting results of doppler and will attempt to see pt at a future date as medically appropriate.       Linus Salmons PT, DPT 01/05/18, 2:34 PM

## 2018-01-05 NOTE — Clinical Social Work Note (Addendum)
CSW received phone call from [facility] they have received insurance authorization for patient to go to SNF once he is medically ready for discharge and orders have been received.  Jones Broom. Arroyo, MSW, [place]  01/05/2018 6:03 PM

## 2018-01-06 ENCOUNTER — Inpatient Hospital Stay: Payer: Medicare Other

## 2018-01-06 LAB — BASIC METABOLIC PANEL
Anion gap: 7 (ref 5–15)
BUN: 15 mg/dL (ref 6–20)
CHLORIDE: 93 mmol/L — AB (ref 101–111)
CO2: 25 mmol/L (ref 22–32)
CREATININE: 0.6 mg/dL — AB (ref 0.61–1.24)
Calcium: 7.8 mg/dL — ABNORMAL LOW (ref 8.9–10.3)
GFR calc Af Amer: >60 mL/min (ref >60)
GFR calc non Af Amer: >60 mL/min (ref >60)
Glucose, Bld: 122 mg/dL — ABNORMAL HIGH (ref 65–99)
POTASSIUM: 3.4 mmol/L — AB (ref 3.5–5.1)
SODIUM: 125 mmol/L — AB (ref 135–145)

## 2018-01-06 LAB — OSMOLALITY, URINE: OSMOLALITY UR: 638 mosm/kg (ref 300–900)

## 2018-01-06 LAB — SODIUM, URINE, RANDOM: SODIUM UR: 147 mmol/L

## 2018-01-06 LAB — CHLORIDE, URINE, RANDOM: Chloride Urine: 142 mmol/L

## 2018-01-06 MED ORDER — PREDNISONE 10 MG PO TABS
10.0000 mg | ORAL_TABLET | Freq: Once | ORAL | Status: AC
  Administered 2018-01-09: 10 mg via ORAL
  Filled 2018-01-06: qty 1

## 2018-01-06 MED ORDER — SODIUM CHLORIDE 1 G PO TABS
2.0000 g | ORAL_TABLET | Freq: Two times a day (BID) | ORAL | Status: DC
  Administered 2018-01-06 – 2018-01-09 (×6): 2 g via ORAL
  Filled 2018-01-06 (×7): qty 2

## 2018-01-06 MED ORDER — FUROSEMIDE 20 MG PO TABS
10.0000 mg | ORAL_TABLET | Freq: Every day | ORAL | Status: DC
  Administered 2018-01-06 – 2018-01-09 (×4): 10 mg via ORAL
  Filled 2018-01-06 (×4): qty 1

## 2018-01-06 MED ORDER — PREDNISONE 20 MG PO TABS
40.0000 mg | ORAL_TABLET | Freq: Once | ORAL | Status: AC
  Administered 2018-01-06: 40 mg via ORAL
  Filled 2018-01-06: qty 2

## 2018-01-06 MED ORDER — PREDNISONE 10 MG PO TABS
30.0000 mg | ORAL_TABLET | Freq: Once | ORAL | Status: AC
  Administered 2018-01-07: 30 mg via ORAL
  Filled 2018-01-06: qty 3

## 2018-01-06 MED ORDER — PREDNISONE 20 MG PO TABS
20.0000 mg | ORAL_TABLET | Freq: Once | ORAL | Status: AC
  Administered 2018-01-08: 08:00:00 20 mg via ORAL
  Filled 2018-01-06: qty 1

## 2018-01-06 NOTE — Progress Notes (Signed)
Patient wife in to visit patient.  Requesting to speak with patient's doctor.  Explain to the wife that patient's doctor is gone for the day but we can have her called tomorrow.  Sent in [place], [place] to give patient's wife an update.  Wife concerned about patient losing his bed at [facility], ensured her that everything is ok with patient's bed per Randall Hiss, CSW note.  [place] updated patient's wife.  Clarise Cruz, RN

## 2018-01-06 NOTE — Care Management Important Message (Signed)
Important Message  Patient Details  Name: Darryl Novak MRN: [id number] Date of Birth: September 25, 1926   Medicare Important Message Given:  Yes    Juliann Pulse A Mcguire Gasparyan 01/06/2018, 10:28 AM

## 2018-01-06 NOTE — Progress Notes (Signed)
PT Cancellation Note  Patient Details Name: Darryl Novak MRN: [id number] DOB: 02-Jun-1926   Cancelled Treatment:    Reason Eval/Treat Not Completed: Medical issues which prohibited therapy   Pt awaiting dopplers to r/o DVT.  Will hold session for now and continue when appropriate.    Chesley Noon 01/06/2018, 12:44 PM

## 2018-01-06 NOTE — Clinical Social Work Note (Signed)
CSW continuing to follow patient's progress throughout discharge planning, plan to discharge to [facility] once he is medically ready for discharge and orders have been received.  [facility] has received insurance authorization for patient to go to SNF.  Jones Broom. Norval Morton, MSW, [place]  01/06/2018 5:39 PM

## 2018-01-06 NOTE — Progress Notes (Signed)
PT Cancellation Note  Patient Details Name: Darryl Novak MRN: [id number] DOB: 1925-10-19   Cancelled Treatment:    Reason Eval/Treat Not Completed: Patient at procedure or test/unavailable. Pt out of room for venous doppler test. Re attempt at a later time/date, as the schedule allows and based on findings.    Larae Grooms, PTA 01/06/2018, 2:41 PM

## 2018-01-06 NOTE — Progress Notes (Signed)
[facility], [place] 01/06/18  Subjective:   Patient is known to our practice from previous admissions.  This time he reports that he presented because of weakness.  He was found to have hyponatremia Nephrology consult requested for evaluation Most recently highest sodium level was 132 from April 4 This admission, sodium was 124 Today's level is 126 with IV hydration Patient denies any nausea or vomiting coughing.  No leg edema   Objective:  Vital signs in last 24 hours:  Temp:  [98.2 F (36.8 C)-99.7 F (37.6 C)] 98.2 F (36.8 C) (04/09 0756) Pulse Rate:  [94-113] 94 (04/09 0756) Resp:  [17-20] 20 (04/09 0756) BP: (105-116)/(54-92) 116/92 (04/09 0756) SpO2:  [91 %-99 %] 99 % (04/09 0756)  Weight change:  Filed Weights   12/31/17 1420  Weight: 150 lb (68 kg)    Intake/Output:    Intake/Output Summary (Last 24 hours) at 01/06/2018 1204 Last data filed at 01/06/2018 0932 Gross per 24 hour  Intake 1451.25 ml  Output -  Net 1451.25 ml     Physical Exam: General:  Laying in the bed, no acute distress  HEENT  eyes closed, moist oral mucous membranes  Neck  supple, no JVD  Pulm/lungs  normal breathing effort, clear to auscultation  CVS/Heart  irregular rhythm, no rub or gallop  Abdomen:   Soft, nontender  Extremities:  Trace edema  Neurologic:  Alert, oriented  Skin:  Normal turgor, no acute rashes          Basic Metabolic Panel:  Recent Labs  Lab 01/01/18 0435 01/02/18 0449 01/04/18 0521 01/05/18 0421 01/06/18 0955  NA 132* 130* 124* 126* 125*  K 3.1* 3.5 3.5 3.5 3.4*  CL 93* 97* 89* 93* 93*  CO2 28 26 26 29 25   GLUCOSE 85 112* 108* 127* 122*  BUN 12 18 15 19 15   CREATININE 0.76 0.83 0.74 0.70 0.60*  CALCIUM 8.2* 7.9* 7.8* 7.9* 7.8*     CBC: Recent Labs  Lab 12/31/17 1425 01/03/18 0455  WBC 7.6 7.0  HGB 12.9* 11.1*  HCT 36.0* 31.1*  MCV 102.3* 102.0*  PLT 149* 141*     No results found for: HEPBSAG, HEPBSAB,  HEPBIGM    Microbiology:  Recent Results (from the past 240 hour(s))  Blood culture (routine x 2)     Status: None   Collection Time: 12/27/17  3:53 PM  Result Value Ref Range Status   Specimen Description BLOOD LEFT ANTECUBITAL  Final   Special Requests   Final    BOTTLES DRAWN AEROBIC AND ANAEROBIC Blood Culture adequate volume   Culture   Final    NO GROWTH 5 DAYS Performed at [facility], [address] [place], [place] [address]    Report Status 01/01/2018 FINAL  Final    Coagulation Studies: No results for input(s): LABPROT, INR in the last 72 hours.  Urinalysis: No results for input(s): COLORURINE, LABSPEC, PHURINE, GLUCOSEU, HGBUR, BILIRUBINUR, KETONESUR, PROTEINUR, UROBILINOGEN, NITRITE, LEUKOCYTESUR in the last 72 hours.  Invalid input(s): APPERANCEUR    Imaging: No results found.   Medications:    . apixaban  5 mg Oral BID  . cholecalciferol  400 Units Oral Daily  . feeding supplement (ENSURE ENLIVE)  237 mL Oral TID BM  . gabapentin  600 mg Oral QHS  . multivitamin with minerals  1 tablet Oral Daily  . polyethylene glycol  17 g Oral Daily  . simvastatin  40 mg Oral QHS  .  valACYclovir  1,000 mg Oral BID   acetaminophen **OR** acetaminophen, ondansetron **OR** ondansetron (ZOFRAN) IV, zolpidem  Assessment/ Plan:  84 y.o. 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 Any transcription errors are unintentional

## 2018-01-06 NOTE — Plan of Care (Addendum)
Pt na cont low  md ordered nephrology consult Problem: Education: Goal: Knowledge of General Education information will improve Outcome: Progressing   Problem: Health Behavior/Discharge Planning: Goal: Ability to manage health-related needs will improve Outcome: Progressing   Problem: Clinical Measurements: Goal: Ability to maintain clinical measurements within normal limits will improve Outcome: Progressing Goal: Will remain free from infection Outcome: Progressing Goal: Diagnostic test results will improve Outcome: Progressing Goal: Respiratory complications will improve Outcome: Progressing Goal: Cardiovascular complication will be avoided Outcome: Progressing   Problem: Activity: Goal: Risk for activity intolerance will decrease Outcome: Progressing   Problem: Nutrition: Goal: Adequate nutrition will be maintained Outcome: Progressing   Problem: Coping: Goal: Level of anxiety will decrease Outcome: Progressing   Problem: Elimination: Goal: Will not experience complications related to bowel motility Outcome: Progressing Goal: Will not experience complications related to urinary retention Outcome: Progressing   Problem: Pain Managment: Goal: General experience of comfort will improve Outcome: Progressing   Problem: Safety: Goal: Ability to remain free from injury will improve Outcome: Progressing   Problem: Skin Integrity: Goal: Risk for impaired skin integrity will decrease Outcome: Progressing

## 2018-01-06 NOTE — Progress Notes (Signed)
[place] [facility] [place] NAME: Darryl Novak    MR#:  [id number]  DATE OF BIRTH:  06-20-1926  SUBJECTIVE:  CHIEF COMPLAINT:   Chief Complaint  Patient presents with  . Weakness   -denies any complaints. Sodium remains low - dopplers of lower extremities pending  REVIEW OF SYSTEMS:  Review of Systems  Constitutional: Negative for chills, fever and malaise/fatigue.  HENT: Positive for hearing loss. Negative for congestion, ear discharge and nosebleeds.   Eyes: Positive for blurred vision.  Respiratory: Positive for hemoptysis. Negative for cough, shortness of breath and wheezing.   Cardiovascular: Negative for chest pain and palpitations.  Gastrointestinal: Negative for abdominal pain, constipation, diarrhea, nausea and vomiting.  Genitourinary: Negative for dysuria.  Musculoskeletal: Negative for myalgias.  Neurological: Positive for weakness. Negative for dizziness, seizures and headaches.  Psychiatric/Behavioral: Negative for depression.    DRUG ALLERGIES:   Allergies  Allergen Reactions  . Ace Inhibitors Other (See Comments)    Intolerant    VITALS:  Blood pressure (!) 103/55, pulse 99, temperature 98.6 F (37 C), temperature source Oral, resp. rate 14, height 5' 7.5" (1.715 m), weight 68 kg (150 lb), SpO2 95 %.  PHYSICAL EXAMINATION:  Physical Exam  GENERAL:  84 y.o.-year-old patient lying in the bed with no acute distress.  EYES: No scleral icterus. Extraocular muscles intact. Legally blind in both eyes HEENT: Head atraumatic, normocephalic. Oropharynx and nasopharynx clear.  NECK:  Supple, no jugular venous distention. No thyroid enlargement, no tenderness.  LUNGS: Normal breath sounds bilaterally, no wheezing, rales,rhonchi or crepitation. No use of accessory muscles of respiration.  CARDIOVASCULAR: S1, S2 normal. No rubs, or gallops. 2/6 systolic murmur is present ABDOMEN: Soft, nontender, nondistended. Bowel sounds  present. No organomegaly or mass.  EXTREMITIES: No pedal edema, cyanosis, or clubbing.  NEUROLOGIC: Cranial nerves II through XII are intact. Muscle strength 5/5 in all extremities. Sensation intact. Gait not checked. Global weakness noted PSYCHIATRIC: The patient is alert and oriented x 3.  SKIN: No obvious rash, lesion, or ulcer.    LABORATORY PANEL:   CBC Recent Labs  Lab 01/03/18 0455  WBC 7.0  HGB 11.1*  HCT 31.1*  PLT 141*   ------------------------------------------------------------------------------------------------------------------  Chemistries  Recent Labs  Lab 01/06/18 0955  NA 125*  K 3.4*  CL 93*  CO2 25  GLUCOSE 122*  BUN 15  CREATININE 0.60*  CALCIUM 7.8*   ------------------------------------------------------------------------------------------------------------------  Cardiac Enzymes Recent Labs  Lab 12/31/17 1432  TROPONINI 0.05*   ------------------------------------------------------------------------------------------------------------------  RADIOLOGY:  No results found.  EKG:   Orders placed or performed during the hospital encounter of 12/31/17  . ED EKG  . 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  1. Hyponatremia- initially treated as hypovolemic hyponatremia. But with Encouraged oral intake and IV fluids. Sodium is still low - stop fluids today - nephrology consulted - check serum and urine osm and sodium levels - CXR with no lung nodules  2.generalized weakness-adult failure to thrive -Physical therapy recommending rehabilitation  3. History of herpes zoster with left retinal necrosis- continue valacyclovir  4. Low-grade fevers- unknown source, check dopplers to r/o DVT - less likely anyway as pt on eliquis UA normal, CXR normal  5.  Hypokalemia- replaced  6. DVT Prophylaxis- on eliquis (for his afib history)  To rehab once sodium improves   All the records are reviewed and case discussed with Care Management/Social Workerr. Management plans discussed with the patient, family and they are in agreement.  CODE STATUS: DO NOT RESUSCITATE  TOTAL TIME TAKING CARE OF THIS PATIENT: 36 minutes.   POSSIBLE D/C IN 1-2 DAYS, DEPENDING ON CLINICAL CONDITION.   Gladstone Lighter M.D on 01/06/2018 at 1:11 PM  Between 7am to 6pm - Pager - [phone]  After 6pm go to [email or web address] - password [email or web address] [facility]  Office  [phone]  CC: Primary care physician; Baxter Hire, MD

## 2018-01-06 NOTE — Progress Notes (Signed)
Spoke with pts wife regarding pts meals and told her that the staff have been feeding pt his meals. She enquired as to whether pt had had a fall due to the mat on the floor at pts bed. Explained  To wife that  That was our safety protocol to  Have  High risk pts on low beds and have mats on floor to help cushion a fall in the event of a fall and informed her that the pt did not  Fall. Talked to wife and alleviated some of her concerns. Told her that we would make sure md spoke with her tomorrow.  She was calmer and  Gave [place] her cell number to call her.

## 2018-01-07 LAB — BASIC METABOLIC PANEL
ANION GAP: 8 (ref 5–15)
BUN: 18 mg/dL (ref 6–20)
CALCIUM: 8.4 mg/dL — AB (ref 8.9–10.3)
CO2: 25 mmol/L (ref 22–32)
Chloride: 95 mmol/L — ABNORMAL LOW (ref 101–111)
Creatinine, Ser: 0.61 mg/dL (ref 0.61–1.24)
Glucose, Bld: 172 mg/dL — ABNORMAL HIGH (ref 65–99)
POTASSIUM: 3.8 mmol/L (ref 3.5–5.1)
Sodium: 128 mmol/L — ABNORMAL LOW (ref 135–145)

## 2018-01-07 LAB — TSH: TSH: 1.126 u[IU]/mL (ref 0.350–4.500)

## 2018-01-07 NOTE — Progress Notes (Signed)
[facility], [place] 01/07/18  Subjective:   Patient is known to our practice from previous admissions.  This time he reports that he presented because of weakness.  He was found to have hyponatremia Nephrology consult requested for evaluation Most recently highest sodium level was 132 from April 4 This admission, sodium was 124->126>128 today     Objective:  Vital signs in last 24 hours:  Temp:  [97.3 F (36.3 C)-99.6 F (37.6 C)] 97.3 F (36.3 C) (04/10 0356) Pulse Rate:  [99-103] 102 (04/10 0356) Resp:  [14-19] 18 (04/10 0356) BP: (103-127)/(55-85) 121/85 (04/10 0356) SpO2:  [87 %-95 %] 87 % (04/10 0356)  Weight change:  Filed Weights   12/31/17 1420  Weight: 150 lb (68 kg)    Intake/Output:    Intake/Output Summary (Last 24 hours) at 01/07/2018 1053 Last data filed at 01/07/2018 0357 Gross per 24 hour  Intake 3911 ml  Output 600 ml  Net 3311 ml     Physical Exam: General:  Laying in the bed, no acute distress  HEENT  eyes closed, moist oral mucous membranes, poor vision  Neck  supple, no JVD  Pulm/lungs  normal breathing effort, clear to auscultation  CVS/Heart  irregular rhythm, no rub or gallop  Abdomen:   Soft, nontender  Extremities:  no leg edema  Neurologic:  Alert, oriented  Skin:  Normal turgor, perioral  dermatitis   Condom cath       Basic Metabolic Panel:  Recent Labs  Lab 01/02/18 0449 01/04/18 0521 01/05/18 0421 01/06/18 0955 01/07/18 0350  NA 130* 124* 126* 125* 128*  K 3.5 3.5 3.5 3.4* 3.8  CL 97* 89* 93* 93* 95*  CO2 26 26 29 25 25   GLUCOSE 112* 108* 127* 122* 172*  BUN 18 15 19 15 18   CREATININE 0.83 0.74 0.70 0.60* 0.61  CALCIUM 7.9* 7.8* 7.9* 7.8* 8.4*     CBC: Recent Labs  Lab 12/31/17 1425 01/03/18 0455  WBC 7.6 7.0  HGB 12.9* 11.1*  HCT 36.0* 31.1*  MCV 102.3* 102.0*  PLT 149* 141*     No results found for: HEPBSAG, HEPBSAB, HEPBIGM    Microbiology:  No results found for  this or any previous visit (from the past 240 hour(s)).  Coagulation Studies: No results for input(s): LABPROT, INR in the last 72 hours.  Urinalysis: No results for input(s): COLORURINE, LABSPEC, PHURINE, GLUCOSEU, HGBUR, BILIRUBINUR, KETONESUR, PROTEINUR, UROBILINOGEN, NITRITE, LEUKOCYTESUR in the last 72 hours.  Invalid input(s): APPERANCEUR    Imaging: US Venous Img Lower Bilateral  Result Date: 01/06/2018 CLINICAL DATA:  Bilateral lower extremity edema. History of fall injuring the right lower extremity. Evaluate for DVT. EXAM: BILATERAL LOWER EXTREMITY VENOUS DOPPLER ULTRASOUND TECHNIQUE: Gray-scale sonography with graded compression, as well as color Doppler and duplex ultrasound were performed to evaluate the lower extremity deep venous systems from the level of the common femoral vein and including the common femoral, femoral, profunda femoral, popliteal and calf veins including the posterior tibial, peroneal and gastrocnemius veins when visible. The superficial great saphenous vein was also interrogated. Spectral Doppler was utilized to evaluate flow at rest and with distal augmentation maneuvers in the common femoral, femoral and popliteal veins. COMPARISON:  None. FINDINGS: RIGHT LOWER EXTREMITY Common Femoral Vein: No evidence of thrombus. Normal compressibility, respiratory phasicity and response to augmentation. Saphenofemoral Junction: No evidence of thrombus. Normal compressibility and flow on color Doppler imaging. Profunda Femoral Vein: No evidence of thrombus. Normal compressibility and flow on  color Doppler imaging. Femoral Vein: No evidence of thrombus. Normal compressibility, respiratory phasicity and response to augmentation. Popliteal Vein: No evidence of thrombus. Normal compressibility, respiratory phasicity and response to augmentation. Calf Veins: No evidence of thrombus. Normal compressibility and flow on color Doppler imaging. Superficial Great Saphenous Vein: No evidence  of thrombus. Normal compressibility. Venous Reflux:  None. Other Findings:  None. LEFT LOWER EXTREMITY Common Femoral Vein: No evidence of thrombus. Normal compressibility, respiratory phasicity and response to augmentation. Saphenofemoral Junction: No evidence of thrombus. Normal compressibility and flow on color Doppler imaging. Profunda Femoral Vein: No evidence of thrombus. Normal compressibility and flow on color Doppler imaging. Femoral Vein: No evidence of thrombus. Normal compressibility, respiratory phasicity and response to augmentation. Popliteal Vein: No evidence of thrombus. Normal compressibility, respiratory phasicity and response to augmentation. Calf Veins: No evidence of thrombus. Normal compressibility and flow on color Doppler imaging. Superficial Great Saphenous Vein: No evidence of thrombus. Normal compressibility. Venous Reflux:  None. Other Findings:  None. IMPRESSION: No evidence of DVT within either lower extremity. Electronically Signed   By: Sandi Mariscal M.D.   On: 01/06/2018 15:14     Medications:    . apixaban  5 mg Oral BID  . cholecalciferol  400 Units Oral Daily  . feeding supplement (ENSURE ENLIVE)  237 mL Oral TID BM  . furosemide  10 mg Oral Daily  . gabapentin  600 mg Oral QHS  . multivitamin with minerals  1 tablet Oral Daily  . polyethylene glycol  17 g Oral Daily  . [START ON 01/09/2018] predniSONE  10 mg Oral Once  . [START ON 01/08/2018] predniSONE  20 mg Oral Once  . simvastatin  40 mg Oral QHS  . sodium chloride  2 g Oral BID WC  . valACYclovir  1,000 mg Oral BID   acetaminophen **OR** acetaminophen, ondansetron **OR** ondansetron (ZOFRAN) IV, zolpidem  Assessment/ Plan:  84 y.o. 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 Hyponatremia likely SIADH Patient was also noted to have low cortisol on March 31, which may be playing a role  Plan: Urine Osm high Short prednisone taper Regular diet Salt tablets, low-dose  Lasix Spironolactone to avoid hypokalemia Will follow    LOS: 5 Valetta Mulroy 4/10/201910:53 AM  [place], [place]  Note: This note was prepared with Dragon dictation. Any transcription errors are unintentional

## 2018-01-07 NOTE — Progress Notes (Signed)
[facility] [place] NAME: Darryl Novak    MR#:  [id number]  DATE OF BIRTH:  1925/10/25  SUBJECTIVE: Patient admitted for generalized weakness and found to have hyponatremia.  Patient sodium is improved from yesterday, started on Lasix, prednisone dose taper, salt tablets.  He says he is feeling better  CHIEF COMPLAINT:   Chief Complaint  Patient presents with  . Weakness   - REVIEW OF SYSTEMS:  Review of Systems  Constitutional: Negative for chills, fever and malaise/fatigue.  HENT: Positive for hearing loss. Negative for congestion, ear discharge and nosebleeds.   Eyes: Positive for blurred vision.  Respiratory: Positive for hemoptysis. Negative for cough, shortness of breath and wheezing.   Cardiovascular: Negative for chest pain and palpitations.  Gastrointestinal: Negative for abdominal pain, constipation, diarrhea, nausea and vomiting.  Genitourinary: Negative for dysuria.  Musculoskeletal: Negative for myalgias.  Neurological: Positive for weakness. Negative for dizziness, seizures and headaches.  Psychiatric/Behavioral: Negative for depression.    DRUG ALLERGIES:   Allergies  Allergen Reactions  . Ace Inhibitors Other (See Comments)    Intolerant    VITALS:  Blood pressure 121/85, pulse (!) 102, temperature (!) 97.3 F (36.3 C), temperature source Oral, resp. rate 18, height 5' 7.5" (1.715 m), weight 68 kg (150 lb), SpO2 (!) 87 %.  PHYSICAL EXAMINATION:  Physical Exam  GENERAL:  84 y.o.-year-old patient lying in the bed with no acute distress.  EYES: No scleral icterus. Extraocular muscles intact. Legally blind in both eyes HEENT: Head atraumatic, normocephalic. Oropharynx and nasopharynx clear.  NECK:  Supple, no jugular venous distention. No thyroid enlargement, no tenderness.  LUNGS: Normal breath sounds bilaterally, no wheezing, rales,rhonchi or crepitation. No use of accessory muscles of respiration.    CARDIOVASCULAR: S1, S2 normal. No rubs, or gallops. 2/6 systolic murmur is present ABDOMEN: Soft, nontender, nondistended. Bowel sounds present. No organomegaly or mass.  EXTREMITIES: No pedal edema, cyanosis, or clubbing.  NEUROLOGIC: Cranial nerves II through XII are intact. Muscle strength 5/5 in all extremities. Sensation intact. Gait not checked. Global weakness noted PSYCHIATRIC: The patient is alert and oriented x 3.  SKIN: No obvious rash, lesion, or ulcer.    LABORATORY PANEL:   CBC Recent Labs  Lab 01/03/18 0455  WBC 7.0  HGB 11.1*  HCT 31.1*  PLT 141*   ------------------------------------------------------------------------------------------------------------------  Chemistries  Recent Labs  Lab 01/07/18 0350  NA 128*  K 3.8  CL 95*  CO2 25  GLUCOSE 172*  BUN 18  CREATININE 0.61  CALCIUM 8.4*   ------------------------------------------------------------------------------------------------------------------  Cardiac Enzymes Recent Labs  Lab 12/31/17 1432  TROPONINI 0.05*   ------------------------------------------------------------------------------------------------------------------  RADIOLOGY:  US Venous Img Lower Bilateral  Result Date: 01/06/2018 CLINICAL DATA:  Bilateral lower extremity edema. History of fall injuring the right lower extremity. Evaluate for DVT. EXAM: BILATERAL LOWER EXTREMITY VENOUS DOPPLER ULTRASOUND TECHNIQUE: Gray-scale sonography with graded compression, as well as color Doppler and duplex ultrasound were performed to evaluate the lower extremity deep venous systems from the level of the common femoral vein and including the common femoral, femoral, profunda femoral, popliteal and calf veins including the posterior tibial, peroneal and gastrocnemius veins when visible. The superficial great saphenous vein was also interrogated. Spectral Doppler was utilized to evaluate flow at rest and with distal augmentation maneuvers in the  common femoral, femoral and popliteal veins. COMPARISON:  None. FINDINGS: RIGHT LOWER EXTREMITY Common Femoral Vein: No evidence of thrombus. Normal compressibility, respiratory phasicity and response  to augmentation. Saphenofemoral Junction: No evidence of thrombus. Normal compressibility and flow on color Doppler imaging. Profunda Femoral Vein: No evidence of thrombus. Normal compressibility and flow on color Doppler imaging. Femoral Vein: No evidence of thrombus. Normal compressibility, respiratory phasicity and response to augmentation. Popliteal Vein: No evidence of thrombus. Normal compressibility, respiratory phasicity and response to augmentation. Calf Veins: No evidence of thrombus. Normal compressibility and flow on color Doppler imaging. Superficial Great Saphenous Vein: No evidence of thrombus. Normal compressibility. Venous Reflux:  None. Other Findings:  None. LEFT LOWER EXTREMITY Common Femoral Vein: No evidence of thrombus. Normal compressibility, respiratory phasicity and response to augmentation. Saphenofemoral Junction: No evidence of thrombus. Normal compressibility and flow on color Doppler imaging. Profunda Femoral Vein: No evidence of thrombus. Normal compressibility and flow on color Doppler imaging. Femoral Vein: No evidence of thrombus. Normal compressibility, respiratory phasicity and response to augmentation. Popliteal Vein: No evidence of thrombus. Normal compressibility, respiratory phasicity and response to augmentation. Calf Veins: No evidence of thrombus. Normal compressibility and flow on color Doppler imaging. Superficial Great Saphenous Vein: No evidence of thrombus. Normal compressibility. Venous Reflux:  None. Other Findings:  None. IMPRESSION: No evidence of DVT within either lower extremity. Electronically Signed   By: Sandi Mariscal M.D.   On: 01/06/2018 15:14    EKG:   Orders placed or performed during the hospital encounter of 12/31/17  . ED EKG  . 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  1. 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  Sodium is improving and 128 today.  [facility] with the Lasix, salt tablets, continue steroid dose taper.  Discussed with nephrology.  I also discussed with patient's wife.  And she is in agreement with that. 2.generalized weakness-adult failure to thrive,-Physical therapy recommending rehabilitation questing physical therapy evaluation again.  3. History of herpes zoster with left retinal necrosis- continue valacyclovir  4. Low-grade fevers-f oximetry ultrasound did not show any DVT.  UA normal, CXR normal  5. Hypokalemia- replaced  6. DVT Prophylaxis- on eliquis (for his afib history)   [facility] tomorrow.  Change to inpatient status.   All the records are reviewed and case discussed with Care Management/Social Workerr. Management plans discussed with the patient, family and they are in agreement.  CODE STATUS: DO NOT RESUSCITATE  TOTAL TIME TAKING CARE OF THIS PATIENT: 36 minutes.   POSSIBLE D/C IN 1-2 DAYS, DEPENDING ON CLINICAL CONDITION.   Epifanio Lesches M.D on 01/07/2018 at 12:41 PM  Between 7am to 6pm - Pager - [phone]  After 6pm go to [email or web address] - password [email or web address] [facility]  Office  [phone]  CC: Primary care physician; Baxter Hire, MD

## 2018-01-07 NOTE — Progress Notes (Signed)
Small amt of emesis x1 after afternoon na tablets, PRN zofran given, no further complaints

## 2018-01-07 NOTE — Progress Notes (Signed)
PT Cancellation Note  Patient Details Name: Darryl Novak MRN: [id number] DOB: [birth year]   Cancelled Treatment:    Reason Eval/Treat Not Completed: Patient declined, no reason specified. Pt refused at this time. Pt may consider this afternoon after more rested. Will re attempt as the schedule allows.    Larae Grooms, PTA 01/07/2018, 12:37 PM

## 2018-01-08 ENCOUNTER — Inpatient Hospital Stay: Payer: Medicare Other

## 2018-01-08 LAB — BASIC METABOLIC PANEL
Anion gap: 8 (ref 5–15)
BUN: 20 mg/dL (ref 6–20)
CHLORIDE: 94 mmol/L — AB (ref 101–111)
CO2: 26 mmol/L (ref 22–32)
CREATININE: 0.75 mg/dL (ref 0.61–1.24)
Calcium: 8.2 mg/dL — ABNORMAL LOW (ref 8.9–10.3)
GFR calc non Af Amer: >60 mL/min (ref >60)
Glucose, Bld: 154 mg/dL — ABNORMAL HIGH (ref 65–99)
Potassium: 3.8 mmol/L (ref 3.5–5.1)
Sodium: 128 mmol/L — ABNORMAL LOW (ref 135–145)

## 2018-01-08 MED ORDER — AMOXICILLIN-POT CLAVULANATE 875-125 MG PO TABS
1.0000 | ORAL_TABLET | Freq: Two times a day (BID) | ORAL | Status: DC
  Administered 2018-01-08 – 2018-01-09 (×3): 1 via ORAL
  Filled 2018-01-08 (×3): qty 1

## 2018-01-08 NOTE — Progress Notes (Signed)
Dr Vianne Bulls made aware of attempt to wean pt off oxygen, o2 sat on room air 84% at rest, o2 reapplied at 2L [place] o2 sat 92%

## 2018-01-08 NOTE — Progress Notes (Signed)
[facility] [place] NAME: Darryl Novak    MR#:  [id number]  DATE OF BIRTH:  [birth year]  SUBJECTIVE: Patient admitted for generalized weakness and found to have hyponatremia.  hypoxic this morning with O2 sat dropping to low 80s.  CHIEF COMPLAINT:   Chief Complaint  Patient presents with  . Weakness  Patient has chronic cough- REVIEW OF SYSTEMS:  Review of Systems  Constitutional: Negative for chills, fever and malaise/fatigue.  HENT: Positive for hearing loss. Negative for congestion, ear discharge and nosebleeds.   Eyes: Negative for blurred vision.  Respiratory: Positive for cough. Negative for hemoptysis, shortness of breath and wheezing.   Cardiovascular: Negative for chest pain and palpitations.  Gastrointestinal: Negative for abdominal pain, constipation, diarrhea, nausea and vomiting.  Genitourinary: Negative for dysuria.  Musculoskeletal: Negative for myalgias.  Neurological: Positive for weakness. Negative for dizziness, seizures and headaches.  Psychiatric/Behavioral: Negative for depression.    DRUG ALLERGIES:   Allergies  Allergen Reactions  . Ace Inhibitors Other (See Comments)    Intolerant    VITALS:  Blood pressure (!) 98/53, pulse 94, temperature 98.2 F (36.8 C), temperature source Oral, resp. rate 18, height 5' 7.5" (1.715 m), weight 68 kg (150 lb), SpO2 92 %.  PHYSICAL EXAMINATION:  Physical Exam  GENERAL:  84 y.o.-year-old patient lying in the bed with no acute distress.  EYES: No scleral icterus. Extraocular muscles intact. Legally blind in both eyes HEENT: Head atraumatic, normocephalic. Oropharynx and nasopharynx clear.  NECK:  Supple, no jugular venous distention. No thyroid enlargement, no tenderness.  LUNGS: Normal breath sounds bilaterally, no wheezing, rales,rhonchi or crepitation. No use of accessory muscles of respiration.  CARDIOVASCULAR: S1, S2 normal. No rubs, or gallops. 2/6 systolic  murmur is present ABDOMEN: Soft, nontender, nondistended. Bowel sounds present. No organomegaly or mass.  EXTREMITIES: No pedal edema, cyanosis, or clubbing.  NEUROLOGIC: Cranial nerves II through XII are intact. Muscle strength 5/5 in all extremities. Sensation intact. Gait not checked. Global weakness noted PSYCHIATRIC: The patient is alert and oriented x 3.  SKIN: No obvious rash, lesion, or ulcer.    LABORATORY PANEL:   CBC Recent Labs  Lab 01/03/18 0455  WBC 7.0  HGB 11.1*  HCT 31.1*  PLT 141*   ------------------------------------------------------------------------------------------------------------------  Chemistries  Recent Labs  Lab 01/08/18 0804  NA 128*  K 3.8  CL 94*  CO2 26  GLUCOSE 154*  BUN 20  CREATININE 0.75  CALCIUM 8.2*   ------------------------------------------------------------------------------------------------------------------  Cardiac Enzymes No results for input(s): TROPONINI in the last 168 hours. ------------------------------------------------------------------------------------------------------------------  RADIOLOGY:  US Venous Img Lower Bilateral  Result Date: 01/06/2018 CLINICAL DATA:  Bilateral lower extremity edema. History of fall injuring the right lower extremity. Evaluate for DVT. EXAM: BILATERAL LOWER EXTREMITY VENOUS DOPPLER ULTRASOUND TECHNIQUE: Gray-scale sonography with graded compression, as well as color Doppler and duplex ultrasound were performed to evaluate the lower extremity deep venous systems from the level of the common femoral vein and including the common femoral, femoral, profunda femoral, popliteal and calf veins including the posterior tibial, peroneal and gastrocnemius veins when visible. The superficial great saphenous vein was also interrogated. Spectral Doppler was utilized to evaluate flow at rest and with distal augmentation maneuvers in the common femoral, femoral and popliteal veins. COMPARISON:  None.  FINDINGS: RIGHT LOWER EXTREMITY Common Femoral Vein: No evidence of thrombus. Normal compressibility, respiratory phasicity and response to augmentation. Saphenofemoral Junction: No evidence of thrombus. Normal compressibility and flow on  color Doppler imaging. Profunda Femoral Vein: No evidence of thrombus. Normal compressibility and flow on color Doppler imaging. Femoral Vein: No evidence of thrombus. Normal compressibility, respiratory phasicity and response to augmentation. Popliteal Vein: No evidence of thrombus. Normal compressibility, respiratory phasicity and response to augmentation. Calf Veins: No evidence of thrombus. Normal compressibility and flow on color Doppler imaging. Superficial Great Saphenous Vein: No evidence of thrombus. Normal compressibility. Venous Reflux:  None. Other Findings:  None. LEFT LOWER EXTREMITY Common Femoral Vein: No evidence of thrombus. Normal compressibility, respiratory phasicity and response to augmentation. Saphenofemoral Junction: No evidence of thrombus. Normal compressibility and flow on color Doppler imaging. Profunda Femoral Vein: No evidence of thrombus. Normal compressibility and flow on color Doppler imaging. Femoral Vein: No evidence of thrombus. Normal compressibility, respiratory phasicity and response to augmentation. Popliteal Vein: No evidence of thrombus. Normal compressibility, respiratory phasicity and response to augmentation. Calf Veins: No evidence of thrombus. Normal compressibility and flow on color Doppler imaging. Superficial Great Saphenous Vein: No evidence of thrombus. Normal compressibility. Venous Reflux:  None. Other Findings:  None. IMPRESSION: No evidence of DVT within either lower extremity. Electronically Signed   By: Sandi Mariscal M.D.   On: 01/06/2018 15:14   Dg Chest Port 1 View  Result Date: 01/08/2018 CLINICAL DATA:  Oxygen desaturation EXAM: PORTABLE CHEST 1 VIEW COMPARISON:  In 01/02/2018 FINDINGS: Low volume chest which may  account for interstitial coarsening. Subtle reticulonodular density on the right. Normal heart size and mediastinal contours accounting for rotation. IMPRESSION: Increased interstitial coarsening which may be from lower lung volumes. Subtle reticulonodular density on the right, cannot exclude early pneumonia in the appropriate clinical setting. Electronically Signed   By: Monte Fantasia M.D.   On: 01/08/2018 09:25    EKG:   Orders placed or performed during the hospital encounter of 12/31/17  . ED EKG  . 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  1. 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  Sodium is stable around 128.  Continue the present treatment along with Aldactone..  2.generalized weakness-adult failure to thrive,-Physical therapy recommending rehabilitation questing physical therapy evaluation again.  3. History of herpes zoster with left retinal necrosis- continue valacyclovir  4.  Acute respiratory failure with hypoxia secondary to possible large pneumonia on the right side: Start on oral antibiotics with Augmentin, continue oxygen, likely [facility].  Patient, wife refused to go home with home health. 5. Hypokalemia- replaced  6. DVT Prophylaxis- on eliquis (for his afib history)     All the records are reviewed and case discussed with Care Management/Social Workerr. Management plans discussed with the patient, family and they are in agreement.  CODE STATUS: DO NOT RESUSCITATE  TOTAL TIME TAKING CARE OF THIS PATIENT: 36 minutes.   POSSIBLE D/C IN 1-2 DAYS, DEPENDING ON CLINICAL CONDITION.   Epifanio Lesches M.D on 01/08/2018 at 2:08 PM  Between 7am to 6pm - Pager - [phone]  After 6pm go to  [email or web address] - password [email or web address] [facility]  Office  [phone]  CC: Primary care physician; Baxter Hire, MD

## 2018-01-08 NOTE — Progress Notes (Signed)
Physical Therapy Treatment Patient Details Name: Darryl Novak MRN: [id number] DOB: Feb 14, 1926 Today's Date: 01/08/2018    History of Present Illness Pt is a 84 y.o. 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   Presented back to the emergency room today for weakness.  Wife unable to take care of him at home.  Used to walk with the help of a walker but after becoming blind in the left eye patient has not been ambulating well.  Workup in the emergency room room showed low sodium level.  CT head no acute abnormality.  Patient has been compliant with his Valtrex medication.  No history of fall, head injury.  Assessment includes: FTT, hyponatremia, dehydration, ambulatory dysfunction, and h/o herpes zoster with retinal necrosis and L eye blindness.     PT Comments    Pt in bed, ready to get up.  Pt struggled but was able to get to edge of bed with [facility] raised and rail but needed no physical assist with increased time.  Once sitting he was able to remain upright x 5 minutes with limited exercises.  Rest given due to fatigue.  Sats remained in 95-98% with 2 lpm.  Stand pivot transfer to recliner at bedside with min/mod a x 1.  Pt with some increased difficulty with mobility this session due to weakness and fatigue but has had limited PT while in hospital due to contraindications.  Vision also makes mobility difficult. SNF remains appropriate at discharge.   Follow Up Recommendations  SNF     Equipment Recommendations  None recommended by PT    Recommendations for Other Services       Precautions / Restrictions Precautions Precautions: Fall Precaution Comments: Pt blind in L eye and very limited vision in the R eye Restrictions Weight Bearing Restrictions: No    Mobility  Bed Mobility Overal bed  mobility: Needs Assistance Bed Mobility: Supine to Sit     Supine to sit: Min guard     General bed mobility comments: struggles but is able to do with HOB raised and rail with min gaurd.  Transfers   Equipment used: None Transfers: Risk manager Sit to Stand: Min assist;Mod assist         General transfer comment: slow with assist.  Difficulty due to vision  Ambulation/Gait             General Gait Details: unable to ambulate today due to fatigue   Stairs            Wheelchair Mobility    Modified Rankin (Stroke Patients Only)       Balance Overall balance assessment: History of Falls;Needs assistance Sitting-balance support: Feet supported;Bilateral upper extremity supported Sitting balance-Leahy Scale: Good     Standing balance support: Bilateral upper extremity supported Standing balance-Leahy Scale: Poor                              Cognition Arousal/Alertness: Awake/alert Behavior During Therapy: WFL for tasks assessed/performed Overall Cognitive Status: Within Functional Limits for tasks assessed                                        Exercises Other  Exercises Other Exercises: sitting edge of bed x 5 minutes with ankle pumps, LAQ and marches x 10 with rest breaks due to fatigue    General Comments        Pertinent Vitals/Pain Pain Assessment: No/denies pain    Home Living                      Prior Function            PT Goals (current goals can now be found in the care plan section) Progress towards PT goals: Progressing toward goals    Frequency    Min 2X/week      PT Plan Current plan remains appropriate    Co-evaluation              AM-PAC PT "6 Clicks" Daily Activity  Outcome Measure  Difficulty turning over in bed (including adjusting bedclothes, sheets and blankets)?: A Little Difficulty moving from lying on back to sitting on the side of the bed? : A  Little Difficulty sitting down on and standing up from a chair with arms (e.g., wheelchair, bedside commode, etc,.)?: Unable Help needed moving to and from a bed to chair (including a wheelchair)?: A Lot Help needed walking in hospital room?: A Lot Help needed climbing 3-5 steps with a railing? : A Lot 6 Click Score: 13    End of Session Equipment Utilized During Treatment: Gait belt Activity Tolerance: Patient limited by fatigue Patient left: in chair;with chair alarm set;with call bell/phone within reach         Time: 0912-0939 PT Time Calculation (min) (ACUTE ONLY): 27 min  Charges:  $Gait Training: 8-22 mins $Therapeutic Exercise: 8-22 mins                    G Codes:       Chesley Noon, PTA 01/08/18, 9:48 AM

## 2018-01-08 NOTE — Progress Notes (Signed)
[facility] [place], [place] 01/08/18  Subjective:   Patient is known to our practice from previous admissions.  This time he reports that he presented because of weakness.  He was found to have hyponatremia Nephrology consult requested for evaluation Most recently highest sodium level was 132 from April 4 This admission, sodium was 124->126>128  Serum sodium appears to be stabilizing at 128 His wife and patient report that he ate a good breakfast     Objective:  Vital signs in last 24 hours:  Temp:  [96.4 F (35.8 C)-98.2 F (36.8 C)] 98.2 F (36.8 C) (04/11 0518) Pulse Rate:  [86-94] 94 (04/11 0518) Resp:  [18-20] 18 (04/11 0518) BP: (98-116)/(53-74) 98/53 (04/11 0518) SpO2:  [84 %-97 %] 92 % (04/11 0720)  Weight change:  Filed Weights   12/31/17 1420  Weight: 150 lb (68 kg)    Intake/Output:    Intake/Output Summary (Last 24 hours) at 01/08/2018 1031 Last data filed at 01/08/2018 0549 Gross per 24 hour  Intake -  Output 1400 ml  Net -1400 ml     Physical Exam: General:  sitting up in chair  HEENT  eyes closed, moist oral mucous membranes, poor vision  Neck  supple, no JVD  Pulm/lungs  normal breathing effort, clear to auscultation  CVS/Heart  irregular rhythm, no rub or gallop  Abdomen:   Soft, nontender  Extremities:  no leg edema  Neurologic:  Alert, oriented  Skin:  Normal turgor          Basic Metabolic Panel:  Recent Labs  Lab 01/04/18 0521 01/05/18 0421 01/06/18 0955 01/07/18 0350 01/08/18 0804  NA 124* 126* 125* 128* 128*  K 3.5 3.5 3.4* 3.8 3.8  CL 89* 93* 93* 95* 94*  CO2 26 29 25 25 26   GLUCOSE 108* 127* 122* 172* 154*  BUN 15 19 15 18 20   CREATININE 0.74 0.70 0.60* 0.61 0.75  CALCIUM 7.8* 7.9* 7.8* 8.4* 8.2*     CBC: Recent Labs  Lab 01/03/18 0455  WBC 7.0  HGB 11.1*  HCT 31.1*  MCV 102.0*  PLT 141*     No results found for: HEPBSAG, HEPBSAB, HEPBIGM    Microbiology:  No results found for  this or any previous visit (from the past 240 hour(s)).  Coagulation Studies: No results for input(s): LABPROT, INR in the last 72 hours.  Urinalysis: No results for input(s): COLORURINE, LABSPEC, PHURINE, GLUCOSEU, HGBUR, BILIRUBINUR, KETONESUR, PROTEINUR, UROBILINOGEN, NITRITE, LEUKOCYTESUR in the last 72 hours.  Invalid input(s): APPERANCEUR    Imaging: US Venous Img Lower Bilateral  Result Date: 01/06/2018 CLINICAL DATA:  Bilateral lower extremity edema. History of fall injuring the right lower extremity. Evaluate for DVT. EXAM: BILATERAL LOWER EXTREMITY VENOUS DOPPLER ULTRASOUND TECHNIQUE: Gray-scale sonography with graded compression, as well as color Doppler and duplex ultrasound were performed to evaluate the lower extremity deep venous systems from the level of the common femoral vein and including the common femoral, femoral, profunda femoral, popliteal and calf veins including the posterior tibial, peroneal and gastrocnemius veins when visible. The superficial great saphenous vein was also interrogated. Spectral Doppler was utilized to evaluate flow at rest and with distal augmentation maneuvers in the common femoral, femoral and popliteal veins. COMPARISON:  None. FINDINGS: RIGHT LOWER EXTREMITY Common Femoral Vein: No evidence of thrombus. Normal compressibility, respiratory phasicity and response to augmentation. Saphenofemoral Junction: No evidence of thrombus. Normal compressibility and flow on color Doppler imaging. Profunda Femoral Vein: No evidence of thrombus. Normal  compressibility and flow on color Doppler imaging. Femoral Vein: No evidence of thrombus. Normal compressibility, respiratory phasicity and response to augmentation. Popliteal Vein: No evidence of thrombus. Normal compressibility, respiratory phasicity and response to augmentation. Calf Veins: No evidence of thrombus. Normal compressibility and flow on color Doppler imaging. Superficial Great Saphenous Vein: No evidence  of thrombus. Normal compressibility. Venous Reflux:  None. Other Findings:  None. LEFT LOWER EXTREMITY Common Femoral Vein: No evidence of thrombus. Normal compressibility, respiratory phasicity and response to augmentation. Saphenofemoral Junction: No evidence of thrombus. Normal compressibility and flow on color Doppler imaging. Profunda Femoral Vein: No evidence of thrombus. Normal compressibility and flow on color Doppler imaging. Femoral Vein: No evidence of thrombus. Normal compressibility, respiratory phasicity and response to augmentation. Popliteal Vein: No evidence of thrombus. Normal compressibility, respiratory phasicity and response to augmentation. Calf Veins: No evidence of thrombus. Normal compressibility and flow on color Doppler imaging. Superficial Great Saphenous Vein: No evidence of thrombus. Normal compressibility. Venous Reflux:  None. Other Findings:  None. IMPRESSION: No evidence of DVT within either lower extremity. Electronically Signed   By: Sandi Mariscal M.D.   On: 01/06/2018 15:14   Dg Chest Port 1 View  Result Date: 01/08/2018 CLINICAL DATA:  Oxygen desaturation EXAM: PORTABLE CHEST 1 VIEW COMPARISON:  In 01/02/2018 FINDINGS: Low volume chest which may account for interstitial coarsening. Subtle reticulonodular density on the right. Normal heart size and mediastinal contours accounting for rotation. IMPRESSION: Increased interstitial coarsening which may be from lower lung volumes. Subtle reticulonodular density on the right, cannot exclude early pneumonia in the appropriate clinical setting. Electronically Signed   By: Monte Fantasia M.D.   On: 01/08/2018 09:25     Medications:    . apixaban  5 mg Oral BID  . cholecalciferol  400 Units Oral Daily  . feeding supplement (ENSURE ENLIVE)  237 mL Oral TID BM  . furosemide  10 mg Oral Daily  . gabapentin  600 mg Oral QHS  . multivitamin with minerals  1 tablet Oral Daily  . polyethylene glycol  17 g Oral Daily  . [START ON  01/09/2018] predniSONE  10 mg Oral Once  . simvastatin  40 mg Oral QHS  . sodium chloride  2 g Oral BID WC  . valACYclovir  1,000 mg Oral BID   acetaminophen **OR** acetaminophen, ondansetron **OR** ondansetron (ZOFRAN) IV, zolpidem  Assessment/ Plan:  84 y.o. 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 Hyponatremia Urine Osm high Patient was also noted to have low cortisol on March 31, which may be playing a role  likely SIADH  Plan: Short prednisone taper Regular diet Salt tablets, low-dose Lasix Spironolactone to avoid hypokalemia Will follow    LOS: [place] 4/11/201910:31 AM  [place], [place]  Note: This note was prepared with Dragon dictation. Any transcription errors are unintentional

## 2018-01-09 MED ORDER — FUROSEMIDE 20 MG PO TABS: 10.0000 mg | ORAL_TABLET | Freq: Every day | ORAL | 0 refills | Status: DC

## 2018-01-09 MED ORDER — SODIUM CHLORIDE 1 G PO TABS: 2.0000 g | ORAL_TABLET | Freq: Two times a day (BID) | ORAL | 0 refills | Status: DC

## 2018-01-09 MED ORDER — PREDNISONE 10 MG (21) PO TBPK: ORAL_TABLET | ORAL | 0 refills | Status: DC

## 2018-01-09 MED ORDER — AMOXICILLIN-POT CLAVULANATE 875-125 MG PO TABS: 1.0000 | ORAL_TABLET | Freq: Two times a day (BID) | ORAL | 0 refills | Status: DC

## 2018-01-09 NOTE — Progress Notes (Signed)
[place], [place] 01/09/18  Subjective:  New serum sodium today. Sodium yesterday was 128. Recent baseline sodium appears to be 130.      Objective:  Vital signs in last 24 hours:  Temp:  [97.4 F (36.3 C)-98.4 F (36.9 C)] 97.4 F (36.3 C) (04/12 0858) Pulse Rate:  [88-95] 95 (04/12 0858) Resp:  [18-20] 20 (04/12 0858) BP: (96-118)/(60-71) 118/71 (04/12 0858) SpO2:  [94 %-96 %] 96 % (04/12 0858)  Weight change:  Filed Weights   12/31/17 1420  Weight: 68 kg (150 lb)    Intake/Output:    Intake/Output Summary (Last 24 hours) at 01/09/2018 1259 Last data filed at 01/09/2018 0426 Gross per 24 hour  Intake 480 ml  Output 900 ml  Net -420 ml     Physical Exam: General:  laying in bed  HEENT  eyes closed, moist oral mucous membranes  Neck  supple, no JVD  Pulm/lungs  normal breathing effort, clear to auscultation  CVS/Heart  irregular rhythm, no rub or gallop  Abdomen:   Soft, nontender  Extremities:  no leg edema  Neurologic:  Alert, oriented  Skin:  Normal turgor          Basic Metabolic Panel:  Recent Labs  Lab 01/04/18 0521 01/05/18 0421 01/06/18 0955 01/07/18 0350 01/08/18 0804  NA 124* 126* 125* 128* 128*  K 3.5 3.5 3.4* 3.8 3.8  CL 89* 93* 93* 95* 94*  CO2 26 29 25 25 26   GLUCOSE 108* 127* 122* 172* 154*  BUN 15 19 15 18 20   CREATININE 0.74 0.70 0.60* 0.61 0.75  CALCIUM 7.8* 7.9* 7.8* 8.4* 8.2*     CBC: Recent Labs  Lab 01/03/18 0455  WBC 7.0  HGB 11.1*  HCT 31.1*  MCV 102.0*  PLT 141*     No results found for: HEPBSAG, HEPBSAB, HEPBIGM    Microbiology:  No results found for this or any previous visit (from the past 240 hour(s)).  Coagulation Studies: No results for input(s): LABPROT, INR in the last 72 hours.  Urinalysis: No results for input(s): COLORURINE, LABSPEC, PHURINE, GLUCOSEU, HGBUR, BILIRUBINUR, KETONESUR, PROTEINUR, UROBILINOGEN, NITRITE, LEUKOCYTESUR in the last 72  hours.  Invalid input(s): APPERANCEUR    Imaging: Dg Chest Port 1 View  Result Date: 01/08/2018 CLINICAL DATA:  Oxygen desaturation EXAM: PORTABLE CHEST 1 VIEW COMPARISON:  In 01/02/2018 FINDINGS: Low volume chest which may account for interstitial coarsening. Subtle reticulonodular density on the right. Normal heart size and mediastinal contours accounting for rotation. IMPRESSION: Increased interstitial coarsening which may be from lower lung volumes. Subtle reticulonodular density on the right, cannot exclude early pneumonia in the appropriate clinical setting. Electronically Signed   By: Monte Fantasia M.D.   On: 01/08/2018 09:25     Medications:    . amoxicillin-clavulanate  1 tablet Oral Q12H  . apixaban  5 mg Oral BID  . cholecalciferol  400 Units Oral Daily  . feeding supplement (ENSURE ENLIVE)  237 mL Oral TID BM  . furosemide  10 mg Oral Daily  . gabapentin  600 mg Oral QHS  . multivitamin with minerals  1 tablet Oral Daily  . polyethylene glycol  17 g Oral Daily  . simvastatin  40 mg Oral QHS  . sodium chloride  2 g Oral BID WC  . valACYclovir  1,000 mg Oral BID   acetaminophen **OR** acetaminophen, ondansetron **OR** ondansetron (ZOFRAN) IV, zolpidem  Assessment/ Plan:  84 y.o. male with HTN, CAD/MI, h/o CVA, Herpetic  reitnitis 10/2017  1. Hyponatremia Urine Osm high Patient was also noted to have low cortisol on March 31, which may be playing a role likely SIADH  Plan: Serum sodium appears to have stabilized.  Continue furosemide 10 mg daily as well as sodium chloride 2 g p.o. twice daily.  Recommend continued monitoring of serum sodium as an outpatient as well.    LOS: [place] 4/12/201912:59 PM  [place], [place]  Note: This note was prepared with Dragon dictation. Any transcription errors are unintentional

## 2018-01-09 NOTE — Progress Notes (Addendum)
Patient is medically stable for D/C to [facility] today. Per [facility] authorization has been received and patient can come today to room 603. RN will call report and arrange EMS for transport. Clinical Education officer, museum (CSW) sent D/C orders to [facility] via [facility]. Patient is aware of above. CSW left patient's wife Arbie Cookey a Advertising account executive. CSW contacted patient's [name] and made him aware of above. Please reconsult if future social work needs arise. CSW signing off.   Patient's wife Arbie Cookey called CSW back and was made aware of above.   [employer], LCSW [phone]

## 2018-01-09 NOTE — Plan of Care (Signed)

## 2018-01-09 NOTE — Progress Notes (Signed)
Pt for discharge to [facility]. Alert/ pt blind left. 02 2 l [place].  Ready for discharge  Sl d/cd. Report called to nicole at [facility] and ems called to transport.

## 2018-01-09 NOTE — Discharge Summary (Signed)
Darryl Novak, is a 84 y.o. male  DOB Aug 09, 1926  MRN [id number].  Admission date:  12/31/2017  Admitting Physician  Saundra Shelling, MD  Discharge Date:  01/09/2018   Primary MD  Baxter Hire, MD  Recommendations for primary care physician for things to follow:   Follow up PCP in 1 week Follow-up with Dr. Murlean Iba in 10 days   Admission Diagnosis  General weakness [R53.1]   Discharge Diagnosis  General weakness [R53.1]   Active Problems:   Adult failure to thrive   Fever      Past Medical History:  Diagnosis Date  . Heart trouble   . Hypertension   . MI (myocardial infarction) ([place]) 1978  . Neuropathy   . Shingles   . Stroke ([place])   . Ulcer     History reviewed. No pertinent surgical history.     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  Patient discharged from hospital on March 31 comes back again because his wife unable to take care of him at home.  Patient usually walks with walker but noted to have more weakness.  Patient found to have hyponatremia with sodium 124 on admission.   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  Started to have low cortisol level on March 31 which may be playing a role.  So patient nephrology started him on salt tablets, prednisone taper, salt tablets, regular diet.  Patient sodium improved with these measures, is  128.  Patient to continue salt tablets, prednisone dose taper, Lasix, spironolactone at discharge. 2.  Generalized weakness secondary to hyponatremia, seen by physical therapy recommended SNF placement.  [facility]. 3.  Acute respiratory failure with hypoxia, O2 sat dropped to 87% on room air 2 days ago.  Secondary to pneumonia, patient requiring oxygen 2 L to keep sats more than 2%.   without oxygen saturation dropping to patient is started on Augmentin yesterday for pneumonia, patient to continue Augmentin for 10 days at discharge. 4.  Hypokalemia: Improved. 5.  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 6 .history of chronic A. fib, patient is on Eliquis for that.    Discharge Condition: stable   Follow UP   Contact information for follow-up providers    Baxter Hire, MD. Schedule an appointment as soon as possible for a visit in 1 week.   Specialty:  Internal Medicine Contact information: [place] [address] [phone]        Murlean Iba, MD. Schedule an appointment as soon as possible for a visit in 1 week(s).   Specialty:  Internal Medicine Contact information: [place] [address] [phone]            Contact information for after-discharge care    Destination    [facility] .   Service:  Skilled Nursing Contact information: [address] [place] [phone]  Discharge Instructions  and  Discharge Medications    Discharge Instructions    Amb Referral to Palliative Care   Complete by:  As directed    Call MD for:  extreme fatigue   Complete by:  As directed    Diet - low sodium heart healthy   Complete by:  As directed    Discharge instructions   Complete by:  As directed    DIET: Regular  diet  DISCHARGE CONDITION: Stable  ACTIVITY: Activity as tolerated  OXYGEN: Home Oxygen: No.   Oxygen Delivery: room air   If you experience worsening of your admission symptoms, develop shortness of breath, life threatening emergency, suicidal or homicidal thoughts you must seek medical attention immediately by calling 911 or calling your MD immediately  if symptoms less severe.  You Must read complete instructions/literature along with all the possible adverse reactions/side effects for all the Medicines you take and that have been prescribed to you. Take any new Medicines after you have completely understood and accept all the possible adverse reactions/side effects.   Please note  You were cared for by a hospitalist during your hospital stay. If you have any questions about your discharge medications or the care you received while you were in the hospital after you are discharged, you can call the unit and asked to speak with the hospitalist on call if the hospitalist that took care of you is not available. Once you are discharged, your primary care physician will handle any further medical issues. Please note that NO REFILLS for any discharge medications will be authorized once you are discharged, as it is imperative that you return to your primary care physician (or establish a relationship with a primary care physician if you do not have one) for your aftercare needs so that they can reassess your need for medications and monitor your lab values.   I   Increase activity slowly   Complete by:  As directed      Allergies as of 01/09/2018      Reactions   Ace Inhibitors Other (See Comments)   Intolerant      Medication List    STOP taking these medications   predniSONE 20 MG tablet Commonly known as:  DELTASONE Replaced by:  predniSONE 10 MG (21) Tbpk tablet     TAKE these medications   ALPHA-LIPOIC ACID PO Take 300 mg by mouth daily.   amoxicillin-clavulanate 875-125  MG tablet Commonly known as:  AUGMENTIN Take 1 tablet by mouth every 12 (twelve) hours.   Biotin 1000 MCG tablet Take 1,000 mcg by mouth daily.   cholecalciferol 400 units Tabs tablet Commonly known as:  VITAMIN D Take 400 Units by mouth daily.   Co Q-10 100 MG Caps Take 100 mg by mouth daily.   ELIQUIS 5 MG Tabs tablet Generic drug:  apixaban Take 5 mg by mouth 2 (two) times daily.   feeding supplement (ENSURE ENLIVE) Liqd Take 237 mLs by mouth 3 (three) times daily between meals.   furosemide 20 MG tablet Commonly known as:  LASIX Take 0.5 tablets (10 mg total) by mouth daily.   gabapentin 600 MG tablet Commonly known as:  NEURONTIN Take 600 mg by mouth at bedtime.   multivitamin tablet Take 1 tablet by mouth daily.   polyethylene glycol packet Commonly known as:  MIRALAX / GLYCOLAX Take 17 g by mouth daily.   predniSONE 10 MG (21) Tbpk tablet Commonly known as:  STERAPRED UNI-PAK 21 TAB Taper  by 10 mg daily Replaces:  predniSONE 20 MG tablet   simvastatin 40 MG tablet Commonly known as:  ZOCOR Take 40 mg by mouth at bedtime.   sodium chloride 1 g tablet Take 2 tablets (2 g total) by mouth 2 (two) times daily with a meal.   VALTREX 1000 MG tablet Generic drug:  valACYclovir Take 1,000 mg by mouth 2 (two) times daily.         Diet and Activity recommendation: See Discharge Instructions above   Consults obtained -nephrology, physical therapy  Major procedures and Radiology Reports - PLEASE review detailed and final reports for all details, in brief -    Dg Chest 1 View  Result Date: 01/02/2018 CLINICAL DATA:  84 year old male with a history of fever EXAM: CHEST  1 VIEW COMPARISON:  12/31/2017, 12/27/2017 FINDINGS: Cardiomediastinal silhouette unchanged in size and contour. No evidence of central vascular congestion. No pneumothorax or pleural effusion. No confluent airspace disease. Similar appearance of interstitial prominence, similar to the prior.  Degenerative changes of the shoulders. No displaced fracture IMPRESSION: Similar appearance of chronic lung changes without evidence of superimposed acute cardiopulmonary disease. Electronically Signed   By: Corrie Mckusick D.O.   On: 01/02/2018 09:55   Dg Chest 2 View  Result Date: 12/31/2017 CLINICAL DATA:  Fever and weakness. EXAM: CHEST - 2 VIEW COMPARISON:  Chest x-ray dated December 27, 2017. FINDINGS: The heart size and mediastinal contours are within normal limits. Normal pulmonary vascularity. Low lung volumes. No focal consolidation, pleural effusion, or pneumothorax. No acute osseous abnormality. IMPRESSION: No active cardiopulmonary disease. Electronically Signed   By: Titus Dubin M.D.   On: 12/31/2017 15:59   Dg Chest 2 View  Result Date: 12/27/2017 CLINICAL DATA:  Cough and weakness. EXAM: CHEST - 2 VIEW COMPARISON:  06/02/2016 FINDINGS: Chronic mild elevation of the right hemidiaphragm. Both lungs are clear. Heart and mediastinum are within normal limits. Trachea is midline. No large pleural effusions. No acute bone abnormality. Chronic degenerative changes in both shoulders. IMPRESSION: No active cardiopulmonary disease. Electronically Signed   By: Markus Daft M.D.   On: 12/27/2017 14:11   Ct Head Wo Contrast  Result Date: 12/31/2017 CLINICAL DATA:  Generalized weakness.  Hypotension. EXAM: CT HEAD WITHOUT CONTRAST TECHNIQUE: Contiguous axial images were obtained from the base of the skull through the vertex without intravenous contrast. COMPARISON:  12/27/2017 FINDINGS: Brain: There is no evidence of acute infarct, intracranial hemorrhage, mass, midline shift, or extra-axial fluid collection. Patchy to confluent hypodensities throughout the cerebral white matter are unchanged from the recent prior and nonspecific but compatible with moderately extensive chronic small vessel ischemic disease. Generalized cerebral atrophy is unchanged and not greater than expected for patient's age. Vascular:  Calcified atherosclerosis at the skull base. No hyperdense vessel. Skull: No fracture or suspicious osseous lesion. Sinuses/Orbits: Mild left maxillary sinus mucosal thickening. chronic appearing left mastoid air cell minimal opacification. Bilateral cataract extraction. Other: None. IMPRESSION: 1. No evidence of acute intracranial abnormality. 2. Moderate chronic small vessel ischemic disease. Electronically Signed   By: Logan Bores M.D.   On: 12/31/2017 15:51   Ct Head Wo Contrast  Result Date: 12/27/2017 CLINICAL DATA:  Weakness.  Altered consciousness. EXAM: CT HEAD WITHOUT CONTRAST TECHNIQUE: Contiguous axial images were obtained from the base of the skull through the vertex without intravenous contrast. COMPARISON:  None. FINDINGS: Brain: Expected cerebral and cerebellar volume loss for age. Moderate to marked low density in the periventricular white matter likely related to small  vessel disease. Vascular: Intracranial atherosclerosis. Skull: Normal Sinuses/Orbits: Fluid within the left maxillary sinus. Surgical changes of both globes. Clear mastoid air cells. Other: None. IMPRESSION: 1.  No acute intracranial abnormality. 2.  Cerebral atrophy and small vessel ischemic change. 3. Sinus disease. Electronically Signed   By: Abigail Miyamoto M.D.   On: 12/27/2017 13:59   US Venous Img Lower Bilateral  Result Date: 01/06/2018 CLINICAL DATA:  Bilateral lower extremity edema. History of fall injuring the right lower extremity. Evaluate for DVT. EXAM: BILATERAL LOWER EXTREMITY VENOUS DOPPLER ULTRASOUND TECHNIQUE: Gray-scale sonography with graded compression, as well as color Doppler and duplex ultrasound were performed to evaluate the lower extremity deep venous systems from the level of the common femoral vein and including the common femoral, femoral, profunda femoral, popliteal and calf veins including the posterior tibial, peroneal and gastrocnemius veins when visible. The superficial great saphenous vein  was also interrogated. Spectral Doppler was utilized to evaluate flow at rest and with distal augmentation maneuvers in the common femoral, femoral and popliteal veins. COMPARISON:  None. FINDINGS: RIGHT LOWER EXTREMITY Common Femoral Vein: No evidence of thrombus. Normal compressibility, respiratory phasicity and response to augmentation. Saphenofemoral Junction: No evidence of thrombus. Normal compressibility and flow on color Doppler imaging. Profunda Femoral Vein: No evidence of thrombus. Normal compressibility and flow on color Doppler imaging. Femoral Vein: No evidence of thrombus. Normal compressibility, respiratory phasicity and response to augmentation. Popliteal Vein: No evidence of thrombus. Normal compressibility, respiratory phasicity and response to augmentation. Calf Veins: No evidence of thrombus. Normal compressibility and flow on color Doppler imaging. Superficial Great Saphenous Vein: No evidence of thrombus. Normal compressibility. Venous Reflux:  None. Other Findings:  None. LEFT LOWER EXTREMITY Common Femoral Vein: No evidence of thrombus. Normal compressibility, respiratory phasicity and response to augmentation. Saphenofemoral Junction: No evidence of thrombus. Normal compressibility and flow on color Doppler imaging. Profunda Femoral Vein: No evidence of thrombus. Normal compressibility and flow on color Doppler imaging. Femoral Vein: No evidence of thrombus. Normal compressibility, respiratory phasicity and response to augmentation. Popliteal Vein: No evidence of thrombus. Normal compressibility, respiratory phasicity and response to augmentation. Calf Veins: No evidence of thrombus. Normal compressibility and flow on color Doppler imaging. Superficial Great Saphenous Vein: No evidence of thrombus. Normal compressibility. Venous Reflux:  None. Other Findings:  None. IMPRESSION: No evidence of DVT within either lower extremity. Electronically Signed   By: Sandi Mariscal M.D.   On: 01/06/2018  15:14   Dg Chest Port 1 View  Result Date: 01/08/2018 CLINICAL DATA:  Oxygen desaturation EXAM: PORTABLE CHEST 1 VIEW COMPARISON:  In 01/02/2018 FINDINGS: Low volume chest which may account for interstitial coarsening. Subtle reticulonodular density on the right. Normal heart size and mediastinal contours accounting for rotation. IMPRESSION: Increased interstitial coarsening which may be from lower lung volumes. Subtle reticulonodular density on the right, cannot exclude early pneumonia in the appropriate clinical setting. Electronically Signed   By: Monte Fantasia M.D.   On: 01/08/2018 09:25    Micro Results    No results found for this or any previous visit (from the past 240 hour(s)).     Today   Subjective:   Candie Echevaria today stable for discharge to skilled nursing.  Objective:   Blood pressure 118/71, pulse 95, temperature (!) 97.4 F (36.3 C), temperature source Oral, resp. rate 20, height 5' 7.5" (1.715 m), weight 68 kg (150 lb), SpO2 96 %.   Intake/Output Summary (Last 24 hours) at 01/09/2018 1017 Last data filed at  01/09/2018 0426 Gross per 24 hour  Intake 720 ml  Output 900 ml  Net -180 ml    Exam Awake Alert, Oriented x 3, is blind in both eyes., Normal affect [place].AT,PERRAL Supple Neck,No JVD, No cervical lymphadenopathy appriciated.  Symmetrical Chest wall movement, Good air movement bilaterally, CTAB RRR,No Gallops,Rubs or new Murmurs, No Parasternal Heave +ve B.Sounds, Abd Soft, Non tender, No organomegaly appriciated, No rebound -guarding or rigidity. No Cyanosis, Clubbing or edema, No new Rash or bruise  Data Review   CBC w Diff:  Lab Results  Component Value Date   WBC 7.0 01/03/2018   HGB 11.1 (L) 01/03/2018   HCT 31.1 (L) 01/03/2018   PLT 141 (L) 01/03/2018   LYMPHOPCT 5 12/27/2017   MONOPCT 4 12/27/2017   EOSPCT 0 12/27/2017   BASOPCT 0 12/27/2017    CMP:  Lab Results  Component Value Date   NA 128 (L) 01/08/2018   K 3.8 01/08/2018    CL 94 (L) 01/08/2018   CO2 26 01/08/2018   BUN 20 01/08/2018   CREATININE 0.75 01/08/2018   PROT 4.9 (L) 12/28/2017   ALBUMIN 2.6 (L) 12/28/2017   BILITOT 1.2 12/28/2017   ALKPHOS 41 12/28/2017   AST 26 12/28/2017   ALT 18 12/28/2017  .   Total Time in preparing paper work, data evaluation and todays exam - 35 minutes  Epifanio Lesches M.D on 01/09/2018 at 10:17 AM    Note: This dictation was prepared with Dragon dictation along with smaller phrase technology. Any transcriptional errors that result from this process are unintentional.

## 2018-01-09 NOTE — Clinical Social Work Placement (Signed)
   CLINICAL SOCIAL WORK PLACEMENT  NOTE  Date:  01/09/2018  Patient Details  Name: Darryl Novak MRN: [id number] Date of Birth: 04-08-1926  Clinical Social Work is seeking post-discharge placement for this patient at the [place] level of care (*CSW will initial, date and re-position this form in  chart as items are completed):  Yes   Patient/family provided with [place] Work Department's list of facilities offering this level of care within the geographic area requested by the patient (or if unable, by the patient's family).  Yes   Patient/family informed of their freedom to choose among providers that offer the needed level of care, that participate in Medicare, Medicaid or managed care program needed by the patient, have an available bed and are willing to accept the patient.  Yes   Patient/family informed of [facility]'s ownership interest in [facility] and [facility], as well as of the fact that they are under no obligation to receive care at these facilities.  PASRR submitted to EDS on       PASRR number received on       Existing PASRR number confirmed on 01/02/18     FL2 transmitted to all facilities in geographic area requested by pt/family on 01/02/18     FL2 transmitted to all facilities within larger geographic area on       Patient informed that his/her managed care company has contracts with or will negotiate with certain facilities, including the following:        Yes   Patient/family informed of bed offers received.  [facility]     Physician recommends and patient chooses bed at      Patient to be transferred to ([facility] ) on 01/09/18.  Patient to be transferred to facility by [facility] )     Patient family notified on 01/09/18 of transfer.  Name of family member notified:  (Patient's [name] is aware of D/C today. )     PHYSICIAN       Additional Comment:     _______________________________________________ Khala Tarte, Veronia Beets, LCSW 01/09/2018, 12:45 PM

## 2018-01-09 NOTE — Care Management Important Message (Signed)
Copy of signed IM left in patient's room.    

## 2018-01-19 ENCOUNTER — Other Ambulatory Visit
Admission: RE | Admit: 2018-01-19 | Discharge: 2018-01-19 | Disposition: A | Discharge status: Home / Self Care | Payer: Medicare Other | Source: Ambulatory Visit | Attending: Family Medicine | Admitting: Family Medicine

## 2018-01-19 DIAGNOSIS — I509 Heart failure, unspecified: Secondary | ICD-10-CM | POA: Insufficient documentation

## 2018-01-19 DIAGNOSIS — R531 Weakness: Secondary | ICD-10-CM | POA: Diagnosis present

## 2018-01-19 LAB — CBC WITH DIFFERENTIAL/PLATELET
BASOS ABS: 0.1 10*3/uL (ref 0–0.1)
BASOS PCT: 1 %
EOS PCT: 3 %
Eosinophils Absolute: 0.2 10*3/uL (ref 0–0.7)
HCT: 36.9 % — ABNORMAL LOW (ref 40.0–52.0)
Hemoglobin: 13 g/dL (ref 13.0–18.0)
Lymphocytes Relative: 15 %
Lymphs Abs: 1.4 10*3/uL (ref 1.0–3.6)
MCH: 37.8 pg — ABNORMAL HIGH (ref 26.0–34.0)
MCHC: 35.3 g/dL (ref 32.0–36.0)
MCV: 107.3 fL — AB (ref 80.0–100.0)
MONO ABS: 0.8 10*3/uL (ref 0.2–1.0)
MONOS PCT: 8 %
Neutro Abs: 7.3 10*3/uL — ABNORMAL HIGH (ref 1.4–6.5)
Neutrophils Relative %: 73 %
PLATELETS: 223 10*3/uL (ref 150–440)
RBC: 3.44 MIL/uL — ABNORMAL LOW (ref 4.40–5.90)
RDW: 20.2 % — ABNORMAL HIGH (ref 11.5–14.5)
WBC: 9.9 10*3/uL (ref 3.8–10.6)

## 2018-01-19 LAB — COMPREHENSIVE METABOLIC PANEL
ALBUMIN: 2.4 g/dL — AB (ref 3.5–5.0)
ALT: 20 U/L (ref 17–63)
ANION GAP: 7 (ref 5–15)
AST: 36 U/L (ref 15–41)
Alkaline Phosphatase: 59 U/L (ref 38–126)
BUN: 12 mg/dL (ref 6–20)
CHLORIDE: 97 mmol/L — AB (ref 101–111)
CO2: 30 mmol/L (ref 22–32)
Calcium: 8.1 mg/dL — ABNORMAL LOW (ref 8.9–10.3)
Creatinine, Ser: 0.72 mg/dL (ref 0.61–1.24)
GFR calc Af Amer: >60 mL/min (ref >60)
Glucose, Bld: 113 mg/dL — ABNORMAL HIGH (ref 65–99)
POTASSIUM: 3.5 mmol/L (ref 3.5–5.1)
Sodium: 134 mmol/L — ABNORMAL LOW (ref 135–145)
TOTAL PROTEIN: 4.8 g/dL — AB (ref 6.5–8.1)
Total Bilirubin: 1 mg/dL (ref 0.3–1.2)

## 2018-01-19 LAB — MAGNESIUM: MAGNESIUM: 1.6 mg/dL — AB (ref 1.7–2.4)

## 2018-03-19 ENCOUNTER — Encounter: Payer: Self-pay | Admitting: Podiatry

## 2018-03-19 ENCOUNTER — Ambulatory Visit (INDEPENDENT_AMBULATORY_CARE_PROVIDER_SITE_OTHER): Payer: Medicare Other | Admitting: Podiatry

## 2018-03-19 DIAGNOSIS — D689 Coagulation defect, unspecified: Secondary | ICD-10-CM | POA: Diagnosis not present

## 2018-03-19 DIAGNOSIS — B351 Tinea unguium: Secondary | ICD-10-CM | POA: Diagnosis not present

## 2018-03-19 DIAGNOSIS — M79676 Pain in unspecified toe(s): Secondary | ICD-10-CM

## 2018-03-19 NOTE — Progress Notes (Signed)
Complaint:  Visit Type: Patient returns to my office for continued preventative foot care services. Complaint: Patient states" my nails have grown long and thick and become painful to walk and wear shoes". The patient presents for preventative foot care services. No changes to ROS.  Patient is on eliquiss.  Podiatric Exam: Vascular: dorsalis pedis and posterior tibial pulses are weakly  palpable bilateral. Capillary return is immediate. Temperature gradient is WNL. Skin turgor WNL  Sensorium: Normal Semmes Weinstein monofilament test. Normal tactile sensation bilaterally. Nail Exam: Pt has thick disfigured discolored nails with subungual debris noted bilateral entire nail hallux through fifth toenails Ulcer Exam: There is no evidence of ulcer or pre-ulcerative changes or infection. Orthopedic Exam: Muscle tone and strength are WNL. No limitations in general ROM. No crepitus or effusions noted. Foot type and digits show no abnormalities. Bony prominences are unremarkable. Skin: No Porokeratosis. No infection or ulcers  Diagnosis:  Onychomycosis, , Pain in right toe, pain in left toes  Treatment & Plan Procedures and Treatment: Consent by patient was obtained for treatment procedures.   Debridement of mycotic and hypertrophic toenails, 1 through 5 bilateral and clearing of subungual debris. No ulceration, no infection noted. Previous visits had iatrogenic lesions. Return Visit-Office Procedure: Patient instructed to return to the office for a follow up visit 3 months for continued evaluation and treatment.    Gardiner Barefoot DPM

## 2018-05-11 ENCOUNTER — Other Ambulatory Visit: Payer: Self-pay

## 2018-05-11 ENCOUNTER — Ambulatory Visit (INDEPENDENT_AMBULATORY_CARE_PROVIDER_SITE_OTHER): Payer: Medicare Other | Admitting: Family

## 2018-05-11 ENCOUNTER — Encounter: Payer: Self-pay | Admitting: Family

## 2018-05-11 VITALS — BP 126/74 | HR 91 | Temp 98.4°F | Ht 68.0 in

## 2018-05-11 DIAGNOSIS — H44113 Panuveitis, bilateral: Secondary | ICD-10-CM

## 2018-05-11 NOTE — Patient Instructions (Signed)
Nice to meet you.  I think it is safe to STOP taking the Valtrex.   I would recommend you getting the Shingrix vaccination when able.   You may follow up with [place] needed.

## 2018-05-11 NOTE — Progress Notes (Signed)
 Subjective:    Patient ID: Darryl Novak, male    DOB: 02/18/1926, 84 y.o.   MRN: [id number]  Chief Complaint  Patient presents with  . Panuveitis of both eyes    HPI:  Darryl Novak is a 84 y.o. male who presents today for initial evaluation of panuveitis.  Darryl Novak was initially seen by [facility] on 10/28/17 with several days of central vision loss located in the left eye. He had no other related symptoms at the time. There was concern for retinal artery occlusion of the left eye. CRP and ESR were negative with no evidence of temporal arteritis. 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 He was treated with Valtrex and prednisone. He was subsequently hospitalized and was off the Valtrex on 3/7 after completing approximately 9 days of therapy and then restarted on it on 3/12 having been on the medication since. He has been seen by Rheumatology with no indication for immune suppression at this time. Prednisone was stopped on 04/06/18.  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 He has a history of Varicella Zoster back in the 1980's and denies any outbreaks since that time. Denies rashes. Continues to take the Valtrex as prescribed with no adverse side effects.     Allergies  Allergen Reactions  . Ace Inhibitors Other (See Comments)    Intolerant      Outpatient Medications Prior to Visit  Medication Sig Dispense Refill  . acetaminophen (TYLENOL) 650 MG CR tablet Take by mouth.    . ALPHA-LIPOIC ACID PO Take 300 mg by mouth daily.    . amLODipine (NORVASC) 2.5 MG tablet Take by mouth.    . apixaban (ELIQUIS) 5 MG TABS tablet Take 5 mg by mouth 2 (two) times daily.     . Biotin 1000 MCG tablet Take 1,000 mcg by mouth daily.    .  Calcium Carb-Ergocalciferol 500-200 MG-UNIT TABS Take by mouth.    . cholecalciferol (VITAMIN D) 400 units TABS tablet Take 400 Units by mouth daily.    . Coenzyme Q10 (CO Q-10) 100 MG CAPS Take 100 mg by mouth daily.     . erythromycin ophthalmic ointment Four (4) times a day.    . feeding supplement, ENSURE ENLIVE, (ENSURE ENLIVE) LIQD Take 237 mLs by mouth 3 (three) times daily between meals. 237 mL 12  . folic acid (FOLVITE) 1 MG tablet Take by mouth.    . furosemide (LASIX) 20 MG tablet Take 0.5 tablets (10 mg total) by mouth daily. 30 tablet 0  . gabapentin (NEURONTIN) 600 MG tablet Take 600 mg by mouth at bedtime.    . Multiple Vitamin (MULTIVITAMIN) tablet Take 1 tablet by mouth daily.    . polyethylene glycol (MIRALAX / GLYCOLAX) packet Take 17 g by mouth daily. 14 each 0  . potassium chloride (K-DUR) 10 MEQ tablet Take by mouth.    . predniSONE (STERAPRED UNI-PAK 21 TAB) 10 MG (21) TBPK tablet Taper by 10 mg daily 21 tablet 0  . sertraline (ZOLOFT) 25 MG tablet Take by mouth.    . simvastatin (ZOCOR) 40 MG tablet Take 40 mg by mouth at bedtime.    . sodium chloride 1 g tablet Take 2 tablets (2 g total) by mouth   2 (two) times daily with a meal. 30 tablet 0  . telmisartan (MICARDIS) 40 MG tablet Take by mouth.    . valACYclovir (VALTREX) 1000 MG tablet Take 1,000 mg by mouth 2 (two) times daily.    . vitamin E 1000 UNIT capsule Take by mouth.    . zolpidem (AMBIEN) 5 MG tablet Take 2.5 mg by mouth at bedtime.     No facility-administered medications prior to visit.      Past Medical History:  Diagnosis Date  . Heart trouble   . Hypertension   . MI (myocardial infarction) (HCC) 1978  . Neuropathy   . Shingles   . Stroke (HCC)   . Ulcer       History reviewed. No pertinent surgical history.    Family History  Problem Relation Age of Onset  . Heart attack Father       Social History   Socioeconomic History  . Marital status: Married    Spouse name: Not on file   . Number of children: Not on file  . Years of education: Not on file  . Highest education level: Not on file  Occupational History  . Occupation: retired  Social Needs  . Financial resource strain: Not on file  . Food insecurity:    Worry: Not on file    Inability: Not on file  . Transportation needs:    Medical: Not on file    Non-medical: Not on file  Tobacco Use  . Smoking status: Former Smoker    Types: Cigarettes  . Smokeless tobacco: Never Used  . Tobacco comment: quit 45 years ago  Substance and Sexual Activity  . Alcohol use: Yes    Comment: 2 drinks a night  . Drug use: No  . Sexual activity: Not Currently  Lifestyle  . Physical activity:    Days per week: Not on file    Minutes per session: Not on file  . Stress: Not on file  Relationships  . Social connections:    Talks on phone: Not on file    Gets together: Not on file    Attends religious service: Not on file    Active member of club or organization: Not on file    Attends meetings of clubs or organizations: Not on file    Relationship status: Not on file  . Intimate partner violence:    Fear of current or ex partner: Not on file    Emotionally abused: Not on file    Physically abused: Not on file    Forced sexual activity: Not on file  Other Topics Concern  . Not on file  Social History Narrative  . Not on file      Review of Systems  Constitutional: Negative for chills, fatigue, fever and unexpected weight change.  Eyes:       Positive for change in vision.  Respiratory: Negative for chest tightness, shortness of breath and wheezing.   Cardiovascular: Negative for chest pain.  Gastrointestinal: Negative for abdominal pain, constipation, diarrhea and vomiting.  Skin: Negative for color change and rash.  Neurological: Negative for syncope, weakness, light-headedness and headaches.  Hematological: Negative for adenopathy.       Objective:    BP 126/74   Pulse 91   Temp 98.4 F (36.9 C)    Ht 5' 8" (1.727 m)   BMI 22.81 kg/m  Nursing note and vital signs reviewed.  Physical Exam  Constitutional: He is oriented to person, place, and time.   He appears well-developed and well-nourished. No distress.  Eyes:  Eyelids appear red. Vision absent in left eye. Acuity decreased in right eye.   Cardiovascular: Normal rate, regular rhythm, normal heart sounds and intact distal pulses.  Pulmonary/Chest: Effort normal and breath sounds normal.  Neurological: He is alert and oriented to person, place, and time.  Skin: Skin is warm and dry.  Psychiatric: He has a normal mood and affect. His behavior is normal. Judgment and thought content normal.        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 Unfortunately he has lost all sight in the left eye and has per his account about 15% of vision remaining in the right eye. He has completed the prednisone per Ophthalmology and he has been on valacyclovir for just over 5 months. His blood work with Rheumatology was negative for Varicella infection in the blood and has no current rashes. Recommend stopping the valacyclovir at this time as the risks of continuing to take the medication outweigh any potential benefits. If he has any new outbreaks or continuous outbreaks can consider restarting medication and possible suppression. I do recommend that he get the Shingrix vaccination when available. There is no further work up that is needed at this time and I am happy to see him back as needed.           I am having Darryl Novak maintain his Co Q-10, apixaban, cholecalciferol, feeding supplement (ENSURE ENLIVE), polyethylene glycol, Biotin, ALPHA-LIPOIC ACID PO, multivitamin, simvastatin, gabapentin, valACYclovir, furosemide, sodium chloride, predniSONE, acetaminophen,  amLODipine, Calcium Carb-Ergocalciferol, erythromycin, folic acid, potassium chloride, sertraline, telmisartan, vitamin E, and zolpidem.   Follow-up: Return if symptoms worsen or fail to improve.    Greg , MSN, FNP-C Nurse Practitioner [facility] Office phone: [phone] Pager: [phone] [facility] Main number: [phone]  

## 2018-05-12 ENCOUNTER — Encounter: Payer: Self-pay | Admitting: Family

## 2018-05-12 DIAGNOSIS — H44113 Panuveitis, bilateral: Secondary | ICD-10-CM | POA: Insufficient documentation

## 2018-05-12 NOTE — Assessment & Plan Note (Signed)
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 Unfortunately he has lost all sight in the left eye and has per his account about 15% of vision remaining in the right eye. He has completed the prednisone per Ophthalmology and he has been on valacyclovir for just over 5 months. His blood work with Rheumatology was negative for Varicella infection in the blood and has no current rashes. Recommend stopping the valacyclovir at this time as the risks of continuing to take the medication outweigh any potential benefits. If he has any new outbreaks or continuous outbreaks can consider restarting medication and possible suppression. I do recommend that he get the Shingrix vaccination when available. There is no further work up that is needed at this time and I am happy to see him back as needed.

## 2018-06-21 IMAGING — CR DG CHEST 2V
1 series · 3 of 3 positions shown · non-contrast
Comparison: Portable chest 07/08/2008.

CLINICAL DATA: 89-year-old male with weakness and shortness of
breath. Cough. Initial encounter.

EXAM:
CHEST  2 VIEW

[Series 1: dg chest 2 view · 0.14mm/px · 3 of 3 slices shown]
[im 1/3]
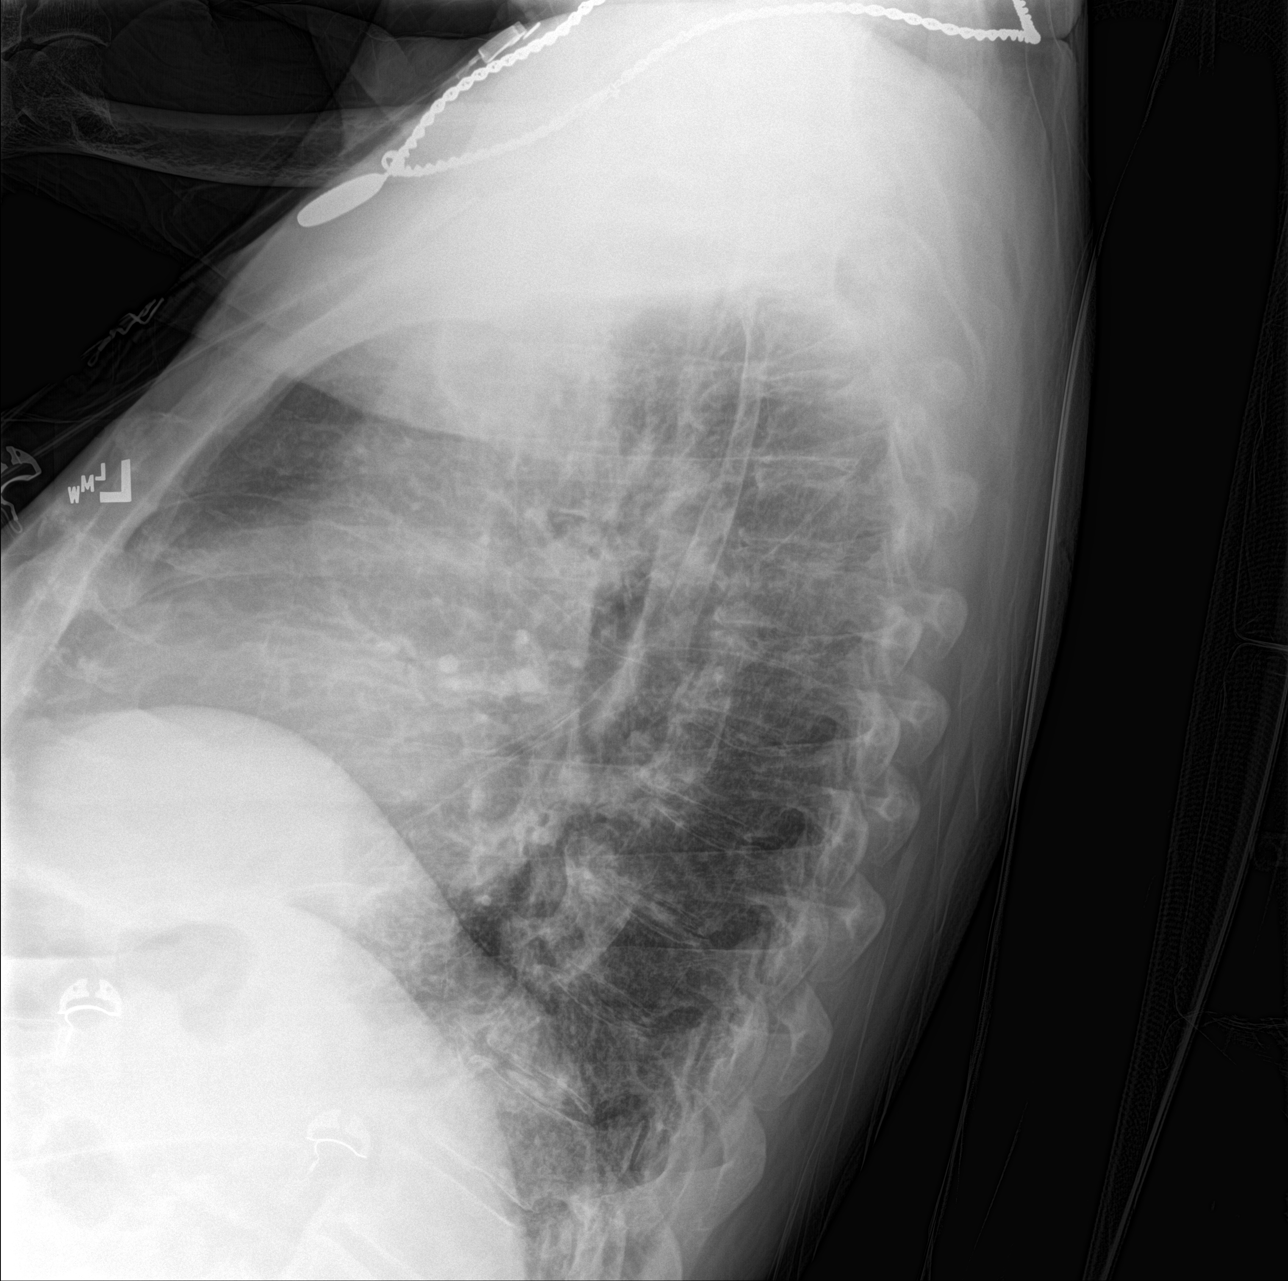
[im 2/3]
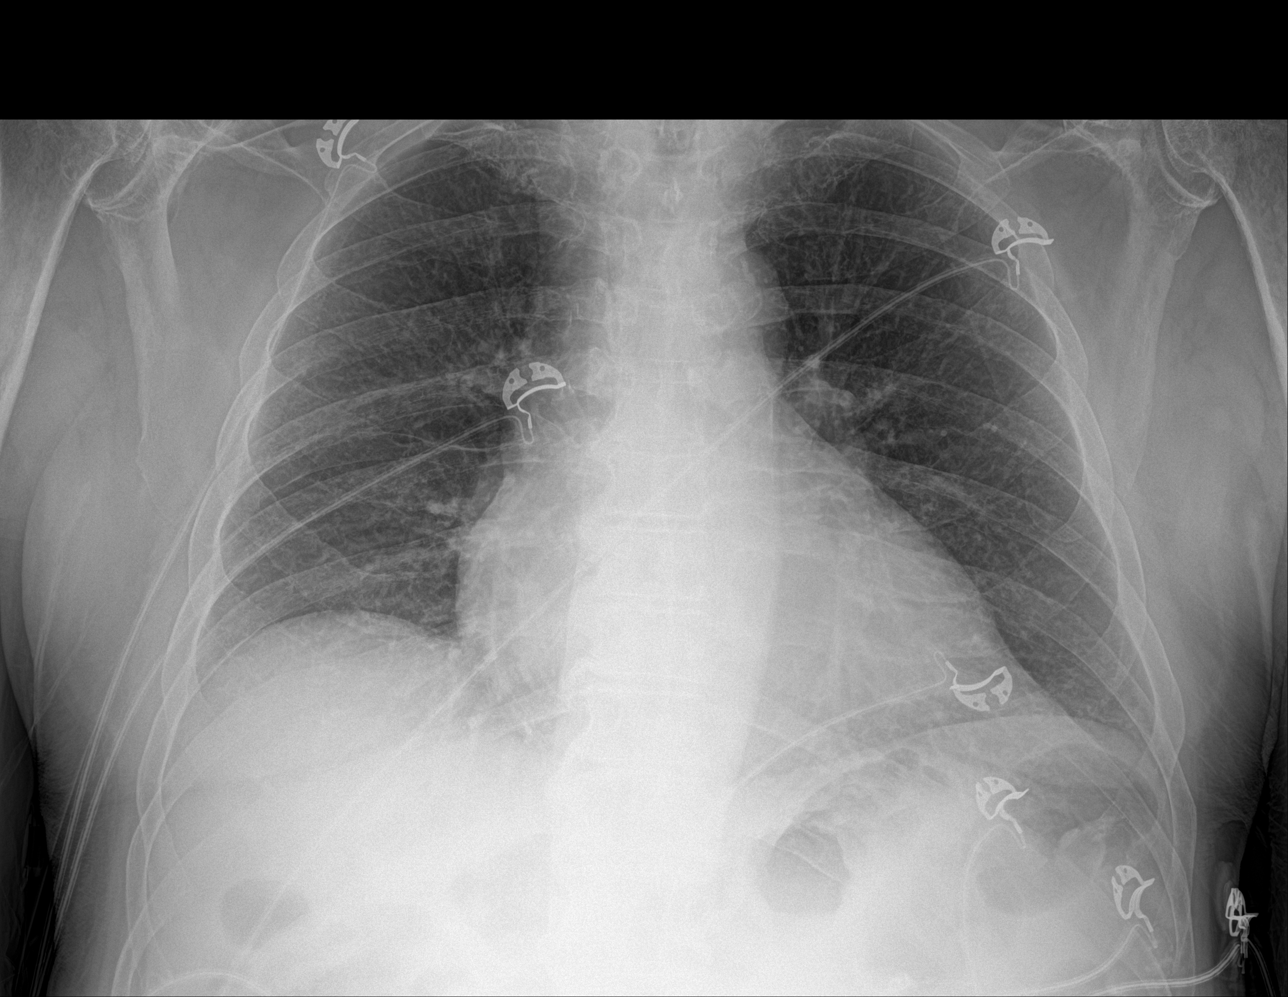
[im 3/3]
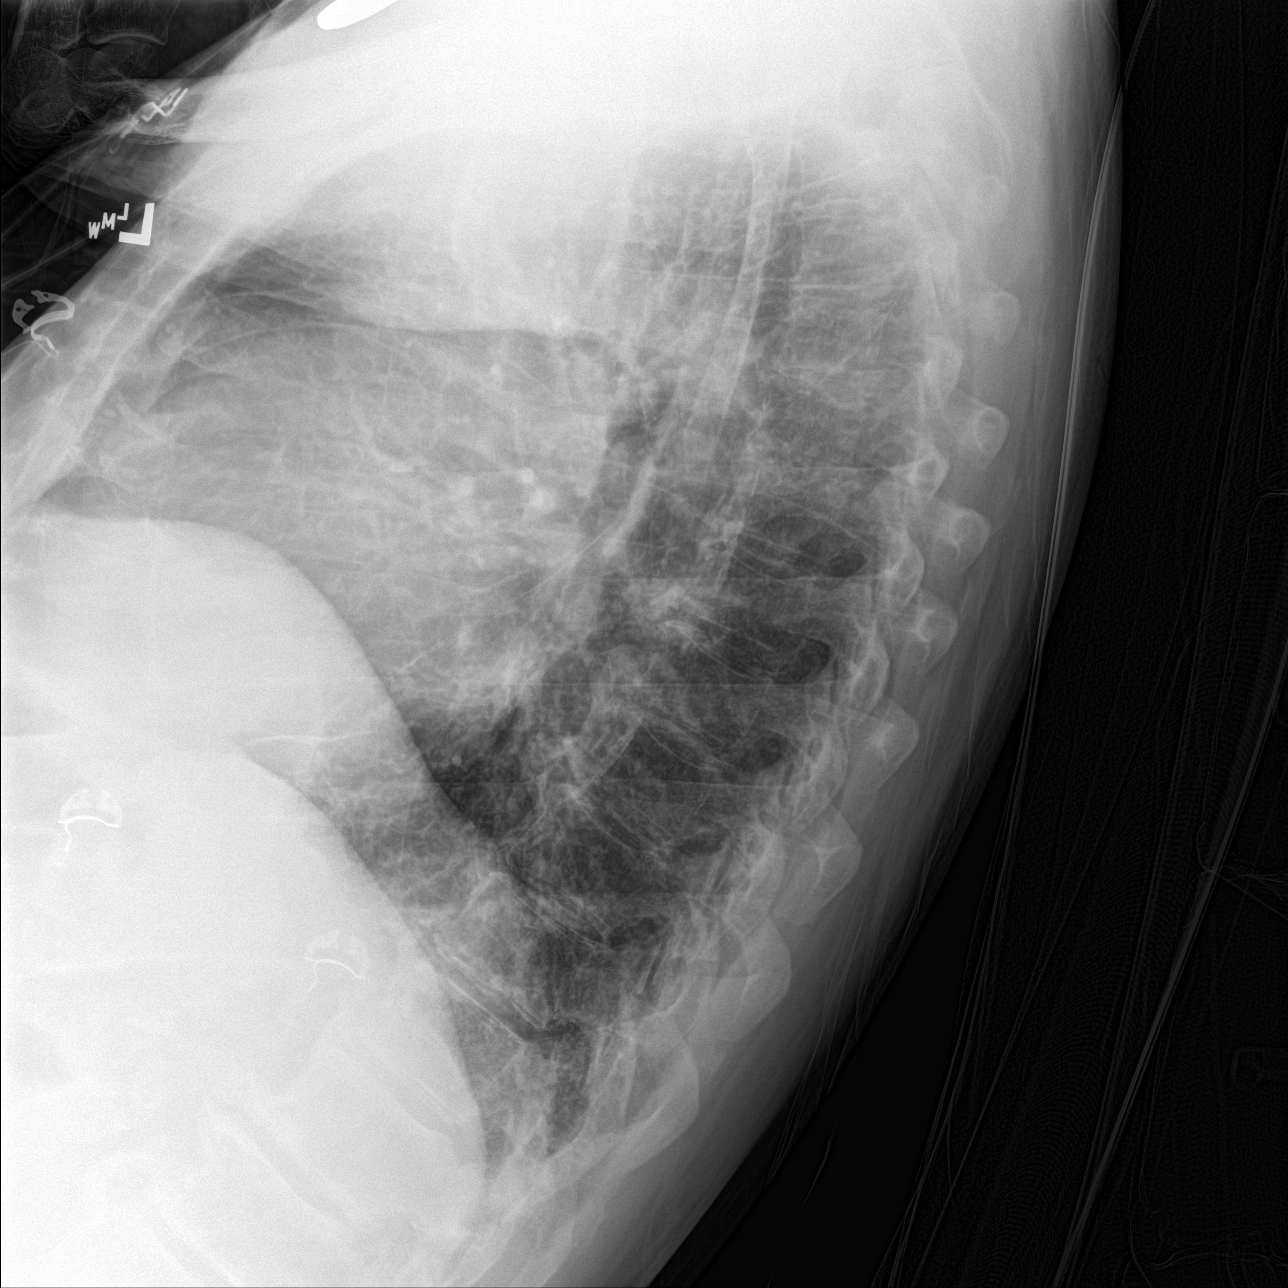

[3 of 3 positions shown; findings below may reference images not displayed]

FINDINGS: Seated AP and lateral views of the chest. Mild to moderate
cardiomegaly appears increased since 4661. Calcified aortic
atherosclerosis. Other mediastinal contours are within normal
limits. Visualized tracheal air column is within normal limits. No
pneumothorax or pulmonary edema. No pleural effusion or confluent
pulmonary opacity. Osteopenia. No acute osseous abnormality
identified.
IMPRESSION: 1. Mild to moderate cardiomegaly appears progressed since [DATE].  No acute cardiopulmonary abnormality.
3.  Calcified aortic atherosclerosis.

## 2018-06-22 ENCOUNTER — Ambulatory Visit: Payer: TRICARE For Life (TFL) | Admitting: Podiatry

## 2018-06-29 ENCOUNTER — Ambulatory Visit: Payer: TRICARE For Life (TFL) | Admitting: Podiatry

## 2018-07-09 ENCOUNTER — Emergency Department: Payer: Medicare Other

## 2018-07-09 ENCOUNTER — Emergency Department
Admission: EM | Admit: 2018-07-09 | Discharge: 2018-07-10 | Disposition: A | Discharge status: Home / Self Care | Payer: Medicare Other | Attending: Emergency Medicine | Admitting: Emergency Medicine

## 2018-07-09 DIAGNOSIS — I2510 Atherosclerotic heart disease of native coronary artery without angina pectoris: Secondary | ICD-10-CM | POA: Insufficient documentation

## 2018-07-09 DIAGNOSIS — I10 Essential (primary) hypertension: Secondary | ICD-10-CM | POA: Insufficient documentation

## 2018-07-09 DIAGNOSIS — R0602 Shortness of breath: Secondary | ICD-10-CM

## 2018-07-09 DIAGNOSIS — Z79899 Other long term (current) drug therapy: Secondary | ICD-10-CM | POA: Diagnosis not present

## 2018-07-09 DIAGNOSIS — I252 Old myocardial infarction: Secondary | ICD-10-CM | POA: Diagnosis not present

## 2018-07-09 DIAGNOSIS — Z7901 Long term (current) use of anticoagulants: Secondary | ICD-10-CM | POA: Diagnosis not present

## 2018-07-09 DIAGNOSIS — Z87891 Personal history of nicotine dependence: Secondary | ICD-10-CM | POA: Diagnosis not present

## 2018-07-09 DIAGNOSIS — Z8673 Personal history of transient ischemic attack (TIA), and cerebral infarction without residual deficits: Secondary | ICD-10-CM | POA: Insufficient documentation

## 2018-07-09 LAB — CBC WITH DIFFERENTIAL/PLATELET
ABS IMMATURE GRANULOCYTES: 0.03 10*3/uL (ref 0.00–0.07)
Basophils Absolute: 0 10*3/uL (ref 0.0–0.1)
Basophils Relative: 1 %
Eosinophils Absolute: 0.2 10*3/uL (ref 0.0–0.5)
Eosinophils Relative: 2 %
HCT: 34.3 % — ABNORMAL LOW (ref 39.0–52.0)
HEMOGLOBIN: 11.4 g/dL — AB (ref 13.0–17.0)
Immature Granulocytes: 0 %
LYMPHS ABS: 1.8 10*3/uL (ref 0.7–4.0)
Lymphocytes Relative: 22 %
MCH: 32.2 pg (ref 26.0–34.0)
MCHC: 33.2 g/dL (ref 30.0–36.0)
MCV: 96.9 fL (ref 80.0–100.0)
MONO ABS: 0.9 10*3/uL (ref 0.1–1.0)
MONOS PCT: 11 %
NEUTROS ABS: 5.4 10*3/uL (ref 1.7–7.7)
Neutrophils Relative %: 64 %
Platelets: 187 10*3/uL (ref 150–400)
RBC: 3.54 MIL/uL — ABNORMAL LOW (ref 4.22–5.81)
RDW: 13.1 % (ref 11.5–15.5)
WBC: 8.3 10*3/uL (ref 4.0–10.5)
nRBC: 0 % (ref 0.0–0.2)

## 2018-07-10 LAB — COMPREHENSIVE METABOLIC PANEL
ALBUMIN: 3.4 g/dL — AB (ref 3.5–5.0)
ALT: 8 U/L (ref 0–44)
AST: 27 U/L (ref 15–41)
Alkaline Phosphatase: 184 U/L — ABNORMAL HIGH (ref 38–126)
Anion gap: 7 (ref 5–15)
BUN: 12 mg/dL (ref 8–23)
CHLORIDE: 102 mmol/L (ref 98–111)
CO2: 24 mmol/L (ref 22–32)
CREATININE: 0.84 mg/dL (ref 0.61–1.24)
Calcium: 8.1 mg/dL — ABNORMAL LOW (ref 8.9–10.3)
GFR calc Af Amer: >60 mL/min (ref >60)
GLUCOSE: 136 mg/dL — AB (ref 70–99)
Potassium: 3.3 mmol/L — ABNORMAL LOW (ref 3.5–5.1)
Sodium: 133 mmol/L — ABNORMAL LOW (ref 135–145)
Total Bilirubin: 0.9 mg/dL (ref 0.3–1.2)
Total Protein: 6.3 g/dL — ABNORMAL LOW (ref 6.5–8.1)

## 2018-07-10 LAB — TROPONIN I: Troponin I: <0.03 ng/mL (ref <0.03)

## 2018-07-10 LAB — BRAIN NATRIURETIC PEPTIDE: B NATRIURETIC PEPTIDE 5: 369 pg/mL — AB (ref 0.0–100.0)

## 2018-07-10 MED ORDER — FUROSEMIDE 10 MG/ML IJ SOLN
10.0000 mg | Freq: Once | INTRAMUSCULAR | Status: AC
  Administered 2018-07-10: 10 mg via INTRAVENOUS
  Filled 2018-07-10: qty 4

## 2018-07-10 NOTE — Discharge Instructions (Addendum)
Return to the ER for worsening symptoms, persistent vomiting, difficulty breathing or other concerns. °

## 2018-07-10 NOTE — ED Provider Notes (Signed)
[facility] Emergency Department Provider Note   ____________________________________________   First MD Initiated Contact with Patient 07/09/18 2352     (approximate)  I have reviewed the triage vital signs and the nursing notes.   HISTORY  Chief Complaint Shortness of Breath    HPI Darryl Novak is a 84 y.o. male Brought to the ED from home via EMS with a chief complaint of shortness of breath. Patient reports feeling like he cannot get a good breath.  Sleeps with a CPAP at night.  Denies recent fever, chills, cough, congestion, chest pain, abdominal pain, nausea, vomiting or diarrhea.  Denies recent travel or trauma.   Past Medical History:  Diagnosis Date  . Heart trouble   . Hypertension   . MI (myocardial infarction) ([place]) 1978  . Neuropathy   . Shingles   . Stroke ([place])   . Ulcer     Patient Active Problem List   Diagnosis Date Noted  . 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 Fever 01/02/2018  . Adult failure to thrive 12/31/2017  . Weakness generalized 12/27/2017  . Hypotension 12/27/2017  . Decubital ulcer 12/27/2017  . 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 Acute renal failure (ARF) ([place]) 11/13/2017  . Chronic atrial fibrillation 04/10/2016  . Obstructive sleep apnea syndrome 10/17/2015  . Cerebrovascular accident, old 09/19/2014  . Arteriosclerosis of coronary artery 03/28/2014  . HLD (hyperlipidemia) 03/28/2014  . BP (high blood pressure) 03/28/2014  . Addison anemia 03/28/2014  . Peripheral vascular disease ([place]) 03/28/2014  . Cerebrovascular accident (CVA) ([place]) 03/28/2014    History reviewed. No pertinent surgical history.  Prior to Admission medications   Medication Sig Start Date End Date Taking? Authorizing Provider  acetaminophen (TYLENOL) 650 MG CR tablet Take by mouth.    [provider]  ALPHA-LIPOIC ACID PO Take 300 mg by mouth daily.    [provider]  amLODipine (NORVASC)  2.5 MG tablet Take by mouth. 03/02/18 03/02/19  [provider]  apixaban (ELIQUIS) 5 MG TABS tablet Take 5 mg by mouth 2 (two) times daily.     [provider]  Biotin 1000 MCG tablet Take 1,000 mcg by mouth daily.    [provider]  Calcium Carb-Ergocalciferol 500-200 MG-UNIT TABS Take by mouth.    [provider]  cholecalciferol (VITAMIN D) 400 units TABS tablet Take 400 Units by mouth daily.    [provider]  Coenzyme Q10 (CO Q-10) 100 MG CAPS Take 100 mg by mouth daily.     [provider]  erythromycin ophthalmic ointment Four (4) times a day.    [provider]  feeding supplement, ENSURE ENLIVE, (ENSURE ENLIVE) LIQD Take 237 mLs by mouth 3 (three) times daily between meals. 11/19/17   Vaughan Basta, MD  folic acid (FOLVITE) 1 MG tablet Take by mouth.    [provider]  furosemide (LASIX) 20 MG tablet Take 0.5 tablets (10 mg total) by mouth daily. 01/09/18   Epifanio Lesches, MD  gabapentin (NEURONTIN) 600 MG tablet Take 600 mg by mouth at bedtime.    [provider]  Multiple Vitamin (MULTIVITAMIN) tablet Take 1 tablet by mouth daily.    [provider]  polyethylene glycol (MIRALAX / GLYCOLAX) packet Take 17 g by mouth daily. 11/19/17   Vaughan Basta, MD  potassium chloride (K-DUR) 10 MEQ tablet Take by mouth.    [provider]  predniSONE (STERAPRED UNI-PAK 21 TAB) 10 MG (21) TBPK tablet  Taper by 10 mg daily 01/09/18   Epifanio Lesches, MD  sertraline (ZOLOFT) 25 MG tablet Take by mouth. 03/02/18 03/02/19  [provider]  simvastatin (ZOCOR) 40 MG tablet Take 40 mg by mouth at bedtime.    [provider]  sodium chloride 1 g tablet Take 2 tablets (2 g total) by mouth 2 (two) times daily with a meal. 01/09/18   Epifanio Lesches, MD  telmisartan (MICARDIS) 40 MG tablet Take by mouth.    [provider]  valACYclovir (VALTREX) 1000 MG tablet  Take 1,000 mg by mouth 2 (two) times daily.    [provider]  vitamin E 1000 UNIT capsule Take by mouth.    [provider]  zolpidem (AMBIEN) 5 MG tablet Take 2.5 mg by mouth at bedtime. 04/01/18   [provider]    Allergies Ace inhibitors  Family History  Problem Relation Age of Onset  . Heart attack Father     Social History Social History   Tobacco Use  . Smoking status: Former Smoker    Types: Cigarettes  . Smokeless tobacco: Never Used  . Tobacco comment: quit 45 years ago  Substance Use Topics  . Alcohol use: Yes    Comment: 2 drinks a night  . Drug use: No    Review of Systems  Constitutional: No fever/chills Eyes: No visual changes. ENT: No sore throat. Cardiovascular: Denies chest pain. Respiratory: Positive for shortness of breath. Gastrointestinal: No abdominal pain.  No nausea, no vomiting.  No diarrhea.  No constipation. Genitourinary: Negative for dysuria. Musculoskeletal: Negative for back pain. Skin: Negative for rash. Neurological: Negative for headaches, focal weakness or numbness.   ____________________________________________   PHYSICAL EXAM:  VITAL SIGNS: ED Triage Vitals  Enc Vitals Group     BP 07/09/18 2326 (!) 170/85     Pulse Rate 07/09/18 2326 (!) 106     Resp 07/09/18 2326 (!) 23     Temp 07/09/18 2326 98 F (36.7 C)     Temp Source 07/09/18 2326 Oral     SpO2 07/09/18 2326 98 %     Weight 07/09/18 2330 157 lb (71.2 kg)     Height 07/09/18 2330 5\' 8"  (1.727 m)     Head Circumference --      Peak Flow --      Pain Score 07/09/18 2329 0     Pain Loc --      Pain Edu? --      Excl. in [place]? --     Constitutional: Alert and oriented.  Elderly appearing and in no acute distress. Eyes: Conjunctivae are normal. PERRL. EOMI. Head: Atraumatic. Nose: No congestion/rhinnorhea. Mouth/Throat: Mucous membranes are moist.  Oropharynx non-erythematous. Neck: No stridor.   Cardiovascular: Normal rate,  irregular rhythm. Grossly normal heart sounds.  Good peripheral circulation. Respiratory: Normal respiratory effort.  No retractions. Lungs with bibasilar rales. Gastrointestinal: Soft and nontender. No distention. No abdominal bruits. No CVA tenderness. Musculoskeletal: No lower extremity tenderness nor edema.  No joint effusions. Neurologic:  Normal speech and language. No gross focal neurologic deficits are appreciated. No gait instability. Skin:  Skin is warm, dry and intact. No rash noted. Psychiatric: Mood and affect are normal. Speech and behavior are normal.  ____________________________________________   LABS (all labs ordered are listed, but only abnormal results are displayed)  Labs Reviewed  CBC WITH DIFFERENTIAL/PLATELET - Abnormal; Notable for the following components:      Result Value   RBC 3.54 (*)  Hemoglobin 11.4 (*)    HCT 34.3 (*)    All other components within normal limits  BRAIN NATRIURETIC PEPTIDE - Abnormal; Notable for the following components:   B Natriuretic Peptide 369.0 (*)    All other components within normal limits  COMPREHENSIVE METABOLIC PANEL - Abnormal; Notable for the following components:   Sodium 133 (*)    Potassium 3.3 (*)    Glucose, Bld 136 (*)    Calcium 8.1 (*)    Total Protein 6.3 (*)    Albumin 3.4 (*)    Alkaline Phosphatase 184 (*)    All other components within normal limits  TROPONIN I   ____________________________________________  EKG  ED ECG REPORT I, Karell Tukes J, the attending physician, personally viewed and interpreted this ECG.   Date: 07/10/2018  EKG Time: 2326  Rate: 98  Rhythm: atrial fibrillation, rate 98  Axis: Normal  Intervals:none  ST&T Change: Nonspecific  ____________________________________________  RADIOLOGY  ED MD interpretation: No acute cardiopulmonary process  Official radiology report(s): Dg Chest Port 1 View  Result Date: 07/10/2018 CLINICAL DATA:  Shortness of breath tonight.  History of myocardial infarct and hypertension EXAM: PORTABLE CHEST 1 VIEW COMPARISON:  01/08/2018 FINDINGS: Shallow inspiration. Cardiac enlargement. No vascular congestion, edema, or consolidation. No blunting of costophrenic angles. No pneumothorax. Mediastinal contours appear intact. Degenerative changes in the spine and shoulders. IMPRESSION: Cardiac enlargement. No evidence of active pulmonary disease. Electronically Signed   By: Lucienne Capers M.D.   On: 07/10/2018 00:24    ____________________________________________   PROCEDURES  Procedure(s) performed: None  Procedures  Critical Care performed: No  ____________________________________________   INITIAL IMPRESSION / ASSESSMENT AND PLAN / ED COURSE  As part of my medical decision making, I reviewed the following data within the [place] notes reviewed and incorporated, Labs reviewed, EKG interpreted, Old chart reviewed, Radiograph reviewed and Notes from prior ED visits   84 year old male who presents with shortness of breath. Differential includes, but is not limited to, viral syndrome, bronchitis including COPD exacerbation, pneumonia, reactive airway disease including asthma, CHF including exacerbation with or without pulmonary/interstitial edema, pneumothorax, ACS, thoracic trauma, and pulmonary embolism.  Room air saturation 98%.  Faint bibasilar rales heard on exam with mildly elevated BNP.  Looks like at one point patient was on a low-dose of Lasix which she is not currently.  Will give low-dose 10 mg IV Lasix for diuresis.  Clinical Course as of Jul 10 137  Fri Jul 10, 2018  0134 Patient has put out several 100 cc of urine.  Feeling significantly better and eager for discharge home.  Wife at bedside.  Strict return precautions given.  Both verbalize understanding and agree with plan of care.   [JS]    Clinical Course User Index [JS] Paulette Blanch, MD      ____________________________________________   FINAL CLINICAL IMPRESSION(S) / ED DIAGNOSES  Final diagnoses:  Shortness of breath     ED Discharge Orders    None       Note:  This document was prepared using Dragon voice recognition software and may include unintentional dictation errors.    Paulette Blanch, MD 07/10/18 Natasha Mead

## 2018-08-17 ENCOUNTER — Encounter: Payer: Self-pay | Admitting: Podiatry

## 2018-08-17 ENCOUNTER — Ambulatory Visit (INDEPENDENT_AMBULATORY_CARE_PROVIDER_SITE_OTHER): Payer: Medicare Other | Admitting: Podiatry

## 2018-08-17 DIAGNOSIS — M79676 Pain in unspecified toe(s): Secondary | ICD-10-CM

## 2018-08-17 DIAGNOSIS — B351 Tinea unguium: Secondary | ICD-10-CM | POA: Diagnosis not present

## 2018-08-17 DIAGNOSIS — D689 Coagulation defect, unspecified: Secondary | ICD-10-CM

## 2018-08-17 NOTE — Progress Notes (Signed)
Complaint:  Visit Type: Patient returns to my office for continued preventative foot care services. Complaint: Patient states" my nails have grown long and thick and become painful to walk and wear shoes". The patient presents for preventative foot care services. No changes to ROS.  Patient is on eliquiss. Patient was last seen five months ago.  Podiatric Exam: Vascular: dorsalis pedis and posterior tibial pulses are weakly  palpable bilateral. Capillary return is immediate. Temperature gradient is WNL. Skin turgor WNL  Sensorium: Normal Semmes Weinstein monofilament test. Normal tactile sensation bilaterally. Nail Exam: Pt has thick disfigured discolored nails with subungual debris noted bilateral entire nail hallux through fifth toenails Ulcer Exam: There is no evidence of ulcer or pre-ulcerative changes or infection. Orthopedic Exam: Muscle tone and strength are WNL. No limitations in general ROM. No crepitus or effusions noted. Foot type and digits show no abnormalities. Bony prominences are unremarkable. Skin: No Porokeratosis. No infection or ulcers  Diagnosis:  Onychomycosis, , Pain in right toe, pain in left toes  Treatment & Plan Procedures and Treatment: Consent by patient was obtained for treatment procedures.   Debridement of mycotic and hypertrophic toenails, 1 through 5 bilateral and clearing of subungual debris. No ulceration, no infection noted.  Return Visit-Office Procedure: Patient instructed to return to the office for a follow up visit 3 months for continued evaluation and treatment.    Gardiner Barefoot DPM

## 2018-11-19 ENCOUNTER — Ambulatory Visit: Payer: TRICARE For Life (TFL) | Admitting: Podiatry

## 2018-11-23 ENCOUNTER — Encounter: Payer: Self-pay | Admitting: Podiatry

## 2018-11-23 ENCOUNTER — Ambulatory Visit (INDEPENDENT_AMBULATORY_CARE_PROVIDER_SITE_OTHER): Payer: Medicare Other | Admitting: Podiatry

## 2018-11-23 DIAGNOSIS — M79675 Pain in left toe(s): Secondary | ICD-10-CM | POA: Diagnosis not present

## 2018-11-23 DIAGNOSIS — D689 Coagulation defect, unspecified: Secondary | ICD-10-CM

## 2018-11-23 DIAGNOSIS — M79674 Pain in right toe(s): Secondary | ICD-10-CM

## 2018-11-23 DIAGNOSIS — B351 Tinea unguium: Secondary | ICD-10-CM | POA: Diagnosis not present

## 2018-11-23 NOTE — Progress Notes (Signed)
Complaint:  Visit Type: Patient returns to my office for continued preventative foot care services. Complaint: Patient states" my nails have grown long and thick and become painful to walk and wear shoes". The patient presents for preventative foot care services. No changes to ROS.  Patient is on eliquiss. Patient is taking eliquiss.  Podiatric Exam: Vascular: dorsalis pedis and posterior tibial pulses are weakly  palpable bilateral. Capillary return is immediate. Temperature gradient is WNL. Skin turgor WNL  Sensorium: Diminished  Semmes Weinstein monofilament test. Normal tactile sensation bilaterally. Nail Exam: Pt has thick disfigured discolored nails with subungual debris noted bilateral entire nail hallux through fifth toenails Ulcer Exam: There is no evidence of ulcer or pre-ulcerative changes or infection. Orthopedic Exam: Muscle tone and strength are WNL. No limitations in general ROM. No crepitus or effusions noted. Foot type and digits show no abnormalities. Bony prominences are unremarkable. Skin: No Porokeratosis. No infection or ulcers  Diagnosis:  Onychomycosis, , Pain in right toe, pain in left toes  Treatment & Plan Procedures and Treatment: Consent by patient was obtained for treatment procedures.   Debridement of mycotic and hypertrophic toenails, 1 through 5 bilateral and clearing of subungual debris. No ulceration, no infection noted.  Return Visit-Office Procedure: Patient instructed to return to the office for a follow up visit 10 weeks  for continued evaluation and treatment.    Ciin Brazzel DPM 

## 2018-12-07 ENCOUNTER — Ambulatory Visit: Payer: TRICARE For Life (TFL) | Admitting: Podiatry

## 2019-02-01 ENCOUNTER — Ambulatory Visit: Payer: TRICARE For Life (TFL) | Admitting: Podiatry

## 2019-02-18 ENCOUNTER — Ambulatory Visit (INDEPENDENT_AMBULATORY_CARE_PROVIDER_SITE_OTHER): Payer: Medicare Other | Admitting: Podiatry

## 2019-02-18 ENCOUNTER — Other Ambulatory Visit: Payer: Self-pay

## 2019-02-18 ENCOUNTER — Encounter: Payer: Self-pay | Admitting: Podiatry

## 2019-02-18 VITALS — Temp 96.7°F

## 2019-02-18 DIAGNOSIS — M79675 Pain in left toe(s): Secondary | ICD-10-CM | POA: Diagnosis not present

## 2019-02-18 DIAGNOSIS — B351 Tinea unguium: Secondary | ICD-10-CM

## 2019-02-18 DIAGNOSIS — M79674 Pain in right toe(s): Secondary | ICD-10-CM

## 2019-02-18 DIAGNOSIS — D689 Coagulation defect, unspecified: Secondary | ICD-10-CM

## 2019-02-18 NOTE — Progress Notes (Signed)
Complaint:  Visit Type: Patient returns to my office for continued preventative foot care services. Complaint: Patient states" my nails have grown long and thick and become painful to walk and wear shoes". The patient presents for preventative foot care services. No changes to ROS.  Patient is on eliquiss. Patient is taking eliquiss.  Podiatric Exam: Vascular: dorsalis pedis and posterior tibial pulses are weakly  palpable bilateral. Capillary return is immediate. Temperature gradient is WNL. Skin turgor WNL  Sensorium: Diminished  Semmes Weinstein monofilament test. Normal tactile sensation bilaterally. Nail Exam: Pt has thick disfigured discolored nails with subungual debris noted bilateral entire nail hallux through fifth toenails Ulcer Exam: There is no evidence of ulcer or pre-ulcerative changes or infection. Orthopedic Exam: Muscle tone and strength are WNL. No limitations in general ROM. No crepitus or effusions noted. Foot type and digits show no abnormalities. Bony prominences are unremarkable. Skin: No Porokeratosis. No infection or ulcers  Diagnosis:  Onychomycosis, , Pain in right toe, pain in left toes  Treatment & Plan Procedures and Treatment: Consent by patient was obtained for treatment procedures.   Debridement of mycotic and hypertrophic toenails, 1 through 5 bilateral and clearing of subungual debris. No ulceration, no infection noted.  Return Visit-Office Procedure: Patient instructed to return to the office for a follow up visit 10 weeks  for continued evaluation and treatment.    Gardiner Barefoot DPM

## 2019-04-29 ENCOUNTER — Encounter: Payer: Self-pay | Admitting: Podiatry

## 2019-04-29 ENCOUNTER — Ambulatory Visit (INDEPENDENT_AMBULATORY_CARE_PROVIDER_SITE_OTHER): Payer: Medicare Other | Admitting: Podiatry

## 2019-04-29 ENCOUNTER — Other Ambulatory Visit: Payer: Self-pay

## 2019-04-29 VITALS — Temp 98.5°F

## 2019-04-29 DIAGNOSIS — D689 Coagulation defect, unspecified: Secondary | ICD-10-CM | POA: Diagnosis not present

## 2019-04-29 DIAGNOSIS — M79674 Pain in right toe(s): Secondary | ICD-10-CM | POA: Diagnosis not present

## 2019-04-29 DIAGNOSIS — M79675 Pain in left toe(s): Secondary | ICD-10-CM | POA: Diagnosis not present

## 2019-04-29 DIAGNOSIS — B351 Tinea unguium: Secondary | ICD-10-CM | POA: Diagnosis not present

## 2019-04-29 NOTE — Progress Notes (Signed)
Complaint:  Visit Type: Patient returns to my office for continued preventative foot care services. Complaint: Patient states" my nails have grown long and thick and become painful to walk and wear shoes". The patient presents for preventative foot care services. No changes to ROS.  Patient is on eliquiss. Patient is taking eliquiss.  Podiatric Exam: Vascular: dorsalis pedis and posterior tibial pulses are weakly  palpable bilateral. Capillary return is immediate. Temperature gradient is WNL. Skin turgor WNL  Sensorium: Diminished  Semmes Weinstein monofilament test. Normal tactile sensation bilaterally. Nail Exam: Pt has thick disfigured discolored nails with subungual debris noted bilateral entire nail hallux through fifth toenails Ulcer Exam: There is no evidence of ulcer or pre-ulcerative changes or infection. Orthopedic Exam: Muscle tone and strength are WNL. No limitations in general ROM. No crepitus or effusions noted. Foot type and digits show no abnormalities. Bony prominences are unremarkable. Skin: No Porokeratosis. No infection or ulcers  Diagnosis:  Onychomycosis, , Pain in right toe, pain in left toes  Treatment & Plan Procedures and Treatment: Consent by patient was obtained for treatment procedures.   Debridement of mycotic and hypertrophic toenails, 1 through 5 bilateral and clearing of subungual debris. No ulceration, no infection noted.  Return Visit-Office Procedure: Patient instructed to return to the office for a follow up visit 10 weeks  for continued evaluation and treatment.    Gardiner Barefoot DPM

## 2019-07-05 ENCOUNTER — Ambulatory Visit (INDEPENDENT_AMBULATORY_CARE_PROVIDER_SITE_OTHER): Payer: Medicare Other | Admitting: Podiatry

## 2019-07-05 ENCOUNTER — Encounter: Payer: Self-pay | Admitting: Podiatry

## 2019-07-05 ENCOUNTER — Other Ambulatory Visit: Payer: Self-pay

## 2019-07-05 DIAGNOSIS — D689 Coagulation defect, unspecified: Secondary | ICD-10-CM | POA: Diagnosis not present

## 2019-07-05 DIAGNOSIS — M79675 Pain in left toe(s): Secondary | ICD-10-CM | POA: Diagnosis not present

## 2019-07-05 DIAGNOSIS — M79674 Pain in right toe(s): Secondary | ICD-10-CM | POA: Diagnosis not present

## 2019-07-05 DIAGNOSIS — B351 Tinea unguium: Secondary | ICD-10-CM | POA: Diagnosis not present

## 2019-07-05 NOTE — Progress Notes (Signed)
Complaint:  Visit Type: Patient returns to my office for continued preventative foot care services. Complaint: Patient states" my nails have grown long and thick and become painful to walk and wear shoes". The patient presents for preventative foot care services. No changes to ROS.  Patient is on eliquiss. Patient is taking eliquiss.  Podiatric Exam: Vascular: dorsalis pedis and posterior tibial pulses are weakly  palpable bilateral. Capillary return is immediate. Temperature gradient is WNL. Skin turgor WNL  Sensorium: Diminished  Semmes Weinstein monofilament test. Normal tactile sensation bilaterally. Nail Exam: Pt has thick disfigured discolored nails with subungual debris noted bilateral entire nail hallux through fifth toenails.  Blackness noted 3,4 right toenail.  Hematoma noted subungually.   Ulcer Exam: There is no evidence of ulcer or pre-ulcerative changes or infection. Orthopedic Exam: Muscle tone and strength are WNL. No limitations in general ROM. No crepitus or effusions noted. Foot type and digits show no abnormalities. Bony prominences are unremarkable. Skin: No Porokeratosis. No infection or ulcers  Diagnosis:  Onychomycosis, , Pain in right toe, pain in left toes  Treatment & Plan Procedures and Treatment: Consent by patient was obtained for treatment procedures.   Debridement of mycotic and hypertrophic toenails, 1 through 5 bilateral and clearing of subungual debris. No ulceration, no infection noted.  Return Visit-Office Procedure: Patient instructed to return to the office for a follow up visit 10 weeks  for continued evaluation and treatment.    Gardiner Barefoot DPM

## 2019-09-13 ENCOUNTER — Ambulatory Visit: Payer: TRICARE For Life (TFL) | Admitting: Podiatry

## 2019-10-07 ENCOUNTER — Encounter: Payer: Self-pay | Admitting: Podiatry

## 2019-10-07 ENCOUNTER — Other Ambulatory Visit: Payer: Self-pay

## 2019-10-07 ENCOUNTER — Ambulatory Visit (INDEPENDENT_AMBULATORY_CARE_PROVIDER_SITE_OTHER): Payer: Medicare Other | Admitting: Podiatry

## 2019-10-07 DIAGNOSIS — M79674 Pain in right toe(s): Secondary | ICD-10-CM

## 2019-10-07 DIAGNOSIS — D689 Coagulation defect, unspecified: Secondary | ICD-10-CM

## 2019-10-07 DIAGNOSIS — M79675 Pain in left toe(s): Secondary | ICD-10-CM

## 2019-10-07 DIAGNOSIS — B351 Tinea unguium: Secondary | ICD-10-CM | POA: Diagnosis not present

## 2019-10-07 NOTE — Progress Notes (Signed)
Complaint:  Visit Type: Patient returns to my office for continued preventative foot care services. Complaint: Patient states" my nails have grown long and thick and become painful to walk and wear shoes". The patient presents for preventative foot care services. No changes to ROS.  Patient is on eliquiss. Patient is taking eliquiss.  Podiatric Exam: Vascular: dorsalis pedis and posterior tibial pulses are weakly  palpable bilateral. Capillary return is immediate. Temperature gradient is WNL. Skin turgor WNL  Sensorium: Diminished  Semmes Weinstein monofilament test. Normal tactile sensation bilaterally. Nail Exam: Pt has thick disfigured discolored nails with subungual debris noted bilateral entire nail hallux through fifth toenails.  Blackness noted 3,4 right toenail.  Hematoma noted subungually.   Ulcer Exam: There is no evidence of ulcer or pre-ulcerative changes or infection. Orthopedic Exam: Muscle tone and strength are WNL. No limitations in general ROM. No crepitus or effusions noted. Foot type and digits show no abnormalities. Bony prominences are unremarkable. Skin: No Porokeratosis. No infection or ulcers  Diagnosis:  Onychomycosis, , Pain in right toe, pain in left toes  Treatment & Plan Procedures and Treatment: Consent by patient was obtained for treatment procedures.   Debridement of mycotic and hypertrophic toenails, 1 through 5 bilateral and clearing of subungual debris. No ulceration, no infection noted.  Return Visit-Office Procedure: Patient instructed to return to the office for a follow up visit 3 months   for continued evaluation and treatment.    Gardiner Barefoot DPM

## 2020-01-03 ENCOUNTER — Other Ambulatory Visit: Payer: Self-pay

## 2020-01-03 ENCOUNTER — Encounter: Payer: Self-pay | Admitting: Podiatry

## 2020-01-03 ENCOUNTER — Ambulatory Visit (INDEPENDENT_AMBULATORY_CARE_PROVIDER_SITE_OTHER): Payer: Medicare Other | Admitting: Podiatry

## 2020-01-03 DIAGNOSIS — M79674 Pain in right toe(s): Secondary | ICD-10-CM

## 2020-01-03 DIAGNOSIS — D689 Coagulation defect, unspecified: Secondary | ICD-10-CM | POA: Diagnosis not present

## 2020-01-03 DIAGNOSIS — B351 Tinea unguium: Secondary | ICD-10-CM | POA: Diagnosis not present

## 2020-01-03 DIAGNOSIS — I739 Peripheral vascular disease, unspecified: Secondary | ICD-10-CM

## 2020-01-03 DIAGNOSIS — M79675 Pain in left toe(s): Secondary | ICD-10-CM | POA: Diagnosis not present

## 2020-01-03 NOTE — Progress Notes (Signed)
This patient returns to my office for at risk foot care.  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  Patient is taking eliquiss.  This patient is unable to cut nails himself since the patient cannot reach his nails.These nails are painful walking and wearing shoes.  This patient presents for at risk foot care today. He presents to the office in a wheelchair and with his daughter.  General Appearance  Alert, conversant and in no acute stress.  Vascular  Dorsalis pedis and posterior tibial  pulses are weakly  palpable  bilaterally.  Capillary return is within normal limits  bilaterally. Temperature is within normal limits  bilaterally.  Neurologic  Senn-Weinstein monofilament wire test within normal limits  bilaterally. Muscle power within normal limits bilaterally.  Nails Thick disfigured discolored nails with subungual debris  from hallux to fifth toes bilaterally. No evidence of bacterial infection or drainage bilaterally.  Orthopedic  No limitations of motion  feet .  No crepitus or effusions noted.  No bony pathology or digital deformities noted.  Skin  normotropic skin with no porokeratosis noted bilaterally.  No signs of infections or ulcers noted.     Onychomycosis  Pain in right toes  Pain in left toes  Consent was obtained for treatment procedures.   Mechanical debridement of nails 1-5  bilaterally performed with a nail nipper.  Filed with dremel without incident.    Return office visit     3 months                 Told patient to return for periodic foot care and evaluation due to potential at risk complications.   Gardiner Barefoot DPM

## 2020-04-10 ENCOUNTER — Other Ambulatory Visit: Payer: Self-pay

## 2020-04-10 ENCOUNTER — Ambulatory Visit (INDEPENDENT_AMBULATORY_CARE_PROVIDER_SITE_OTHER): Payer: Medicare Other | Admitting: Podiatry

## 2020-04-10 ENCOUNTER — Encounter: Payer: Self-pay | Admitting: Podiatry

## 2020-04-10 DIAGNOSIS — M79675 Pain in left toe(s): Secondary | ICD-10-CM

## 2020-04-10 DIAGNOSIS — N179 Acute kidney failure, unspecified: Secondary | ICD-10-CM | POA: Diagnosis not present

## 2020-04-10 DIAGNOSIS — B351 Tinea unguium: Secondary | ICD-10-CM

## 2020-04-10 DIAGNOSIS — I739 Peripheral vascular disease, unspecified: Secondary | ICD-10-CM | POA: Diagnosis not present

## 2020-04-10 DIAGNOSIS — D689 Coagulation defect, unspecified: Secondary | ICD-10-CM | POA: Diagnosis not present

## 2020-04-10 DIAGNOSIS — M79674 Pain in right toe(s): Secondary | ICD-10-CM

## 2020-04-10 NOTE — Progress Notes (Signed)
This patient returns to my office for at risk foot care.  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  Patient is taking eliquiss.  This patient is unable to cut nails himself since the patient cannot reach his nails.These nails are painful walking and wearing shoes.  This patient presents for at risk foot care today. He presents to the office  with his daughter.  General Appearance  Alert, conversant and in no acute stress.  Vascular  Dorsalis pedis and posterior tibial  pulses are weakly  palpable  bilaterally.  Capillary return is within normal limits  bilaterally. Temperature is within normal limits  bilaterally.  Neurologic  Senn-Weinstein monofilament wire test within normal limits  bilaterally. Muscle power within normal limits bilaterally.  Nails Thick disfigured discolored nails with subungual debris  from hallux to fifth toes bilaterally. No evidence of bacterial infection or drainage bilaterally.  Orthopedic  No limitations of motion  feet .  No crepitus or effusions noted.  No bony pathology or digital deformities noted.  Skin  normotropic skin with no porokeratosis noted bilaterally.  No signs of infections or ulcers noted.     Onychomycosis  Pain in right toes  Pain in left toes  Consent was obtained for treatment procedures.   Mechanical debridement of nails 1-5  bilaterally performed with a nail nipper.  Filed with dremel without incident.    Return office visit     10 weeks                 Told patient to return for periodic foot care and evaluation due to potential at risk complications.   Fallynn Gravett DPM  

## 2020-04-26 DIAGNOSIS — G4700 Insomnia, unspecified: Secondary | ICD-10-CM | POA: Insufficient documentation

## 2020-05-17 ENCOUNTER — Ambulatory Visit: Payer: Medicare Other | Admitting: Dermatology

## 2020-06-19 ENCOUNTER — Encounter: Payer: Self-pay | Admitting: Podiatry

## 2020-06-19 ENCOUNTER — Encounter (INDEPENDENT_AMBULATORY_CARE_PROVIDER_SITE_OTHER): Payer: Self-pay

## 2020-06-19 ENCOUNTER — Ambulatory Visit (INDEPENDENT_AMBULATORY_CARE_PROVIDER_SITE_OTHER): Payer: Medicare Other | Admitting: Podiatry

## 2020-06-19 ENCOUNTER — Other Ambulatory Visit: Payer: Self-pay

## 2020-06-19 DIAGNOSIS — N179 Acute kidney failure, unspecified: Secondary | ICD-10-CM

## 2020-06-19 DIAGNOSIS — I739 Peripheral vascular disease, unspecified: Secondary | ICD-10-CM

## 2020-06-19 DIAGNOSIS — M79675 Pain in left toe(s): Secondary | ICD-10-CM | POA: Diagnosis not present

## 2020-06-19 DIAGNOSIS — B351 Tinea unguium: Secondary | ICD-10-CM

## 2020-06-19 DIAGNOSIS — M79674 Pain in right toe(s): Secondary | ICD-10-CM

## 2020-06-19 DIAGNOSIS — D689 Coagulation defect, unspecified: Secondary | ICD-10-CM

## 2020-06-19 NOTE — Progress Notes (Signed)
This patient returns to my office for at risk foot care.  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  Patient is taking eliquiss.  This patient is unable to cut nails himself since the patient cannot reach his nails.These nails are painful walking and wearing shoes.  This patient presents for at risk foot care today. He presents to the office  with his daughter.  General Appearance  Alert, conversant and in no acute stress.  Vascular  Dorsalis pedis and posterior tibial  pulses are weakly  palpable  bilaterally.  Capillary return is within normal limits  bilaterally. Temperature is within normal limits  bilaterally.  Neurologic  Senn-Weinstein monofilament wire test within normal limits  bilaterally. Muscle power within normal limits bilaterally.  Nails Thick disfigured discolored nails with subungual debris  from hallux to fifth toes bilaterally. No evidence of bacterial infection or drainage bilaterally.  Orthopedic  No limitations of motion  feet .  No crepitus or effusions noted.  No bony pathology or digital deformities noted.  Skin  normotropic skin with no porokeratosis noted bilaterally.  No signs of infections or ulcers noted.     Onychomycosis  Pain in right toes  Pain in left toes  Consent was obtained for treatment procedures.   Mechanical debridement of nails 1-5  bilaterally performed with a nail nipper.  Filed with dremel without incident.    Return office visit     10 weeks                 Told patient to return for periodic foot care and evaluation due to potential at risk complications.   Gardiner Barefoot DPM

## 2020-06-27 ENCOUNTER — Other Ambulatory Visit: Payer: Self-pay

## 2020-06-27 ENCOUNTER — Emergency Department: Payer: Medicare Other

## 2020-06-27 ENCOUNTER — Emergency Department
Admission: EM | Admit: 2020-06-27 | Discharge: 2020-06-27 | Disposition: A | Discharge status: Home / Self Care | Payer: Medicare Other | Attending: Emergency Medicine | Admitting: Emergency Medicine

## 2020-06-27 DIAGNOSIS — I10 Essential (primary) hypertension: Secondary | ICD-10-CM | POA: Diagnosis not present

## 2020-06-27 DIAGNOSIS — Z8673 Personal history of transient ischemic attack (TIA), and cerebral infarction without residual deficits: Secondary | ICD-10-CM | POA: Diagnosis not present

## 2020-06-27 DIAGNOSIS — G459 Transient cerebral ischemic attack, unspecified: Secondary | ICD-10-CM | POA: Diagnosis not present

## 2020-06-27 DIAGNOSIS — Z7901 Long term (current) use of anticoagulants: Secondary | ICD-10-CM | POA: Insufficient documentation

## 2020-06-27 DIAGNOSIS — I2510 Atherosclerotic heart disease of native coronary artery without angina pectoris: Secondary | ICD-10-CM | POA: Diagnosis not present

## 2020-06-27 DIAGNOSIS — Z79899 Other long term (current) drug therapy: Secondary | ICD-10-CM | POA: Diagnosis not present

## 2020-06-27 DIAGNOSIS — Z87891 Personal history of nicotine dependence: Secondary | ICD-10-CM | POA: Diagnosis not present

## 2020-06-27 DIAGNOSIS — R29810 Facial weakness: Secondary | ICD-10-CM | POA: Diagnosis present

## 2020-06-27 LAB — PROTIME-INR
INR: 1.5 — ABNORMAL HIGH (ref 0.8–1.2)
Prothrombin Time: 17.9 seconds — ABNORMAL HIGH (ref 11.4–15.2)

## 2020-06-27 LAB — DIFFERENTIAL
Abs Immature Granulocytes: 0.07 10*3/uL (ref 0.00–0.07)
Basophils Absolute: 0.1 10*3/uL (ref 0.0–0.1)
Basophils Relative: 1 %
Eosinophils Absolute: 0.6 10*3/uL — ABNORMAL HIGH (ref 0.0–0.5)
Eosinophils Relative: 7 %
Immature Granulocytes: 1 %
Lymphocytes Relative: 24 %
Lymphs Abs: 2 10*3/uL (ref 0.7–4.0)
Monocytes Absolute: 1 10*3/uL (ref 0.1–1.0)
Monocytes Relative: 12 %
Neutro Abs: 4.7 10*3/uL (ref 1.7–7.7)
Neutrophils Relative %: 55 %

## 2020-06-27 LAB — COMPREHENSIVE METABOLIC PANEL
ALT: 13 U/L (ref 0–44)
AST: 33 U/L (ref 15–41)
Albumin: 3.6 g/dL (ref 3.5–5.0)
Alkaline Phosphatase: 69 U/L (ref 38–126)
Anion gap: 8 (ref 5–15)
BUN: 15 mg/dL (ref 8–23)
CO2: 27 mmol/L (ref 22–32)
Calcium: 8.6 mg/dL — ABNORMAL LOW (ref 8.9–10.3)
Chloride: 97 mmol/L — ABNORMAL LOW (ref 98–111)
Creatinine, Ser: 1.6 mg/dL — ABNORMAL HIGH (ref 0.61–1.24)
GFR calc Af Amer: 43 mL/min — ABNORMAL LOW (ref >60)
GFR calc non Af Amer: 37 mL/min — ABNORMAL LOW (ref >60)
Glucose, Bld: 91 mg/dL (ref 70–99)
Potassium: 4 mmol/L (ref 3.5–5.1)
Sodium: 132 mmol/L — ABNORMAL LOW (ref 135–145)
Total Bilirubin: 0.8 mg/dL (ref 0.3–1.2)
Total Protein: 6.8 g/dL (ref 6.5–8.1)

## 2020-06-27 LAB — APTT: aPTT: 35 seconds (ref 24–36)

## 2020-06-27 LAB — CBC
HCT: 40.2 % (ref 39.0–52.0)
Hemoglobin: 14.1 g/dL (ref 13.0–17.0)
MCH: 32.5 pg (ref 26.0–34.0)
MCHC: 35.1 g/dL (ref 30.0–36.0)
MCV: 92.6 fL (ref 80.0–100.0)
Platelets: 159 10*3/uL (ref 150–400)
RBC: 4.34 MIL/uL (ref 4.22–5.81)
RDW: 13 % (ref 11.5–15.5)
WBC: 8.5 10*3/uL (ref 4.0–10.5)
nRBC: 0 % (ref 0.0–0.2)

## 2020-06-27 LAB — GLUCOSE, CAPILLARY: Glucose-Capillary: 100 mg/dL — ABNORMAL HIGH (ref 70–99)

## 2020-06-27 MED ORDER — GADOBUTROL 1 MMOL/ML IV SOLN
7.0000 mL | Freq: Once | INTRAVENOUS | Status: AC | PRN
  Administered 2020-06-27: 7 mL via INTRAVENOUS

## 2020-06-27 MED ORDER — SODIUM CHLORIDE 0.9% FLUSH
3.0000 mL | Freq: Once | INTRAVENOUS | Status: DC
Start: 2020-06-27 — End: 2020-06-27

## 2020-06-27 NOTE — Consult Note (Signed)
Neurology Consultation Reason for Consult: Facial droop Referring Physician: Joni Fears, [place]  CC: Facial droop  History is obtained from: Daughter, patient  HPI: Darryl Novak is a 86 y.o. male with a history of stroke as well as atrial fibrillation currently on anticoagulation who presents with transient facial droop and hand weakness.  He was at his cardiologist's office when he began slurring his speech and his daughter noticed that he had right facial weakness, she also checked his grip strength and found his right hand to be weak.  Due to these findings, he was brought in to the emergency department where a code stroke was activated.   On my exam, he had significantly improved per his daughter, but was still with some right arm and facial numbness which he states could have been there for longer than today, but he is not certain because he does not typically compare them this way.  LKW: 10:45 am tpa given?: no, anticoagulation    ROS: A 14 point ROS was performed and is negative except as noted in the HPI.  Past Medical History:  Diagnosis Date  . Heart trouble   . Hypertension   . MI (myocardial infarction) ([place]) 1978  . Neuropathy   . Shingles   . Squamous cell carcinoma of skin 09/05/2010   Left lat. neck infra-auricular. WD SCC. [facility]  . Squamous cell carcinoma of skin 08/18/2013   Right medial distal dorsum forearm. KA pattern. [facility]  . Squamous cell carcinoma of skin 10/03/2016   Left inferior sideburn. SCCis, hypertrophic. [facility] 01/30/2017  . Squamous cell carcinoma of skin 04/08/2018   Left forearm. WD SCC. Excised 05/06/2018, margins free.  Marland Kitchen Squamous cell carcinoma of skin 04/08/2018   Right upper arm. SCCis, hypertrophic.  Marland Kitchen Squamous cell carcinoma of skin 02/17/2019   Mid vertex scalp. SCCis. ED[facility].  . Stroke ([place])   . Ulcer      Family History  Problem Relation Age of Onset  . Heart attack Father      Social History:  reports that he has quit smoking. His  smoking use included cigarettes. He has never used smokeless tobacco. He reports current alcohol use. He reports that he does not use drugs.   Exam: Current vital signs: BP (!) 126/58   Pulse 79   Temp 98.8 F (37.1 C) (Oral)   Resp 16   SpO2 96%  Vital signs in last 24 hours: Temp:  [98.7 F (37.1 C)-98.8 F (37.1 C)] 98.8 F (37.1 C) (09/28 1200) Pulse Rate:  [73-79] 79 (09/28 1200) Resp:  [16] 16 (09/28 1200) BP: (114-126)/(54-58) 126/58 (09/28 1200) SpO2:  [94 %-96 %] 96 % (09/28 1200)   Physical Exam  Constitutional: Appears well-developed and well-nourished.  Psych: Affect appropriate to situation Eyes: No scleral injection HENT: No OP obstrucion MSK: no joint deformities.  Cardiovascular: Normal rate and regular rhythm.  Respiratory: Effort normal, non-labored breathing GI: Soft.  No distension. There is no tenderness.  Skin: WDI  Neuro: Mental Status: Patient is awake, alert, oriented to person, place, month, year, and situation. Patient is able to give a clear and coherent history. No signs of aphasia or neglect Cranial Nerves: II: Pupils are not reactive, he has no vision. III,IV, VI: Eyes are slightly disconjugate, as at baseline V: Facial sensation is symmetric to temperature VII: Facial movement with decreased nasolabial fold on the right (looking at a picture in epic I wonder if this is old) VIII: hearing is intact to voice X: Uvula  elevates symmetrically XI: Shoulder shrug is symmetric. XII: tongue is midline without atrophy or fasciculations.  Motor: Tone is normal. Bulk is normal. 5/5 strength was present in all four extremities.  Sensory: Sensation is diminished in the right arm and face to light touch Cerebellar: He is able to perform finger-nose-finger with mild tremor but no ataxia.    I have reviewed labs in epic and the results pertinent to this consultation are: Creatinine 1.6  I have reviewed the images obtained: CT head is  negative  Impression: 86 year old male with transient right face and arm weakness.  It is possible that this represents transient ischemic attack versus mild stroke.  I think that an MRI would be helpful to further determine this as well as to further evaluate his vasculature.  If no critical stenosis of his cervical carotid or acute stroke on his MRI, then I am not sure how much benefit he would obtain from admission given that he is already on anticoagulation.  Recommendations: 1) MRI brain as well as cervical artery evaluation 2) continue anticoagulant if his MRI is negative 3) if positive, may need admission for further evaluation (e.g. PT, OT, etc.) 4) if negative could follow-up as an outpatient.   Roland Rack, MD [facility] [phone]  If 7pm- 7am, please page neurology on call as listed in [place].

## 2020-06-27 NOTE — Code Documentation (Signed)
Pt arrives via POV from his cardiologist office where he had sudden onset on facial droop, left sided weakness, and slurred speech. 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 Leonel Ramsay at bedside, pt on xarelto with hx of Afib, pt improving with resolved weakness and slurred speech, no tPA, MRI ordered, Q2 neuro checks and vital signs report off to [facility]

## 2020-06-27 NOTE — ED Notes (Addendum)
Pt speaking in full sentences, speech, face symmetric, grip strength equal, pt c/o tiredness and change in sensation on the right side

## 2020-06-27 NOTE — ED Notes (Signed)
Pt to MRI

## 2020-06-27 NOTE — ED Notes (Signed)
Pt moved to ED rm 8, son now at bedside, pt placed on tele monitor, pending MRI

## 2020-06-27 NOTE — Discharge Instructions (Signed)
Your lab tests, CT scan, and MRIs today were all okay.  Continue taking all of your home medications, including Eliquis, and follow up with your primary care doctor this week for further assessment.

## 2020-06-27 NOTE — Progress Notes (Signed)
Ch visited with Pt and Pt's daughter-in-law in response to Code STROKE. Ch checked in with Pt and family. Pt seemed to be getting better. Ch let daughter-in-law know to let RN know about further support needed in the future.

## 2020-06-27 NOTE — Progress Notes (Signed)
CODE STROKE- PHARMACY COMMUNICATION   Time CODE STROKE called/page received:_0   Time response to CODE STROKE was made (in person or via phone): 1140  Time Stroke Kit retrieved from [place] (only if needed):N/A  Name of Provider/Nurse contacted:Olivia, RN  Past Medical History:  Diagnosis Date  . Heart trouble   . Hypertension   . MI (myocardial infarction) ([place]) 1978  . Neuropathy   . Shingles   . Squamous cell carcinoma of skin 09/05/2010   Left lat. neck infra-auricular. WD SCC. [facility]  . Squamous cell carcinoma of skin 08/18/2013   Right medial distal dorsum forearm. KA pattern. [facility]  . Squamous cell carcinoma of skin 10/03/2016   Left inferior sideburn. SCCis, hypertrophic. [facility] 01/30/2017  . Squamous cell carcinoma of skin 04/08/2018   Left forearm. WD SCC. Excised 05/06/2018, margins free.  Marland Kitchen Squamous cell carcinoma of skin 04/08/2018   Right upper arm. SCCis, hypertrophic.  Marland Kitchen Squamous cell carcinoma of skin 02/17/2019   Mid vertex scalp. SCCis. [facility].  . Stroke ([place])   . Ulcer    Prior to Admission medications   Medication Sig Start Date End Date Taking? Authorizing Provider  acetaminophen (TYLENOL) 650 MG CR tablet Take by mouth.    [provider]  albuterol (VENTOLIN HFA) 108 (90 Base) MCG/ACT inhaler Inhale into the lungs. 06/09/20   [provider]  ALPHA-LIPOIC ACID PO Take 300 mg by mouth daily.    [provider]  apixaban (ELIQUIS) 5 MG TABS tablet Take 5 mg by mouth 2 (two) times daily.     [provider]  Biotin 1000 MCG tablet Take 1,000 mcg by mouth daily.    [provider]  Calcium Carb-Ergocalciferol 500-200 MG-UNIT TABS Take by mouth.    [provider]  carboxymethylcellulose (REFRESH PLUS) 0.5 % SOLN INSTILL 1 DROP INTO THE RIGHT EYE, 4 TIMES PER DAY 06/08/20   [provider]  cetirizine (ZYRTEC) 10 MG tablet Take by mouth. 08/12/18 08/12/19  [provider]  cholecalciferol (VITAMIN D)  400 units TABS tablet Take 400 Units by mouth daily.    [provider]  citalopram (CELEXA) 10 MG tablet Take 10 mg by mouth daily. 05/22/20   [provider]  Coenzyme Q10 (CO Q-10) 100 MG CAPS Take 100 mg by mouth daily.     [provider]  diphenhydrAMINE (BENADRYL) 25 mg capsule Take 50 mg by mouth at bedtime. 08/09/19   [provider]  erythromycin ophthalmic ointment Four (4) times a day.    [provider]  feeding supplement, ENSURE ENLIVE, (ENSURE ENLIVE) LIQD Take 237 mLs by mouth 3 (three) times daily between meals. 11/19/17   Vaughan Basta, MD  folic acid (FOLVITE) 1 MG tablet Take by mouth.    [provider]  furosemide (LASIX) 20 MG tablet Take 1 tablet by mouth daily. 04/17/20   [provider]  gabapentin (NEURONTIN) 600 MG tablet Take 600 mg by mouth at bedtime.    [provider]  loperamide (IMODIUM) 2 MG capsule  07/16/19   [provider]  Multiple Vitamin (MULTIVITAMIN) tablet Take 1 tablet by mouth daily.    [provider]  Multiple Vitamins-Minerals (CERTAVITE SENIOR/ANTIOXIDANT) TABS Take 1 tablet by mouth daily. 04/27/20   [provider]  NON FORMULARY Okay to order speech evaluation  Dx:R47.9 09/10/19   [provider]  polyethylene glycol (MIRALAX / GLYCOLAX) packet Take 17 g by mouth daily. 11/19/17   Vaughan Basta, MD  potassium  chloride (K-DUR) 10 MEQ tablet Take by mouth.    [provider]  potassium chloride (K-DUR,KLOR-CON) 10 MEQ tablet  10/06/18   [provider]  prednisoLONE acetate (PRED FORTE) 1 % ophthalmic suspension  10/05/18   [provider]  predniSONE (STERAPRED UNI-PAK 21 TAB) 10 MG (21) TBPK tablet Taper by 10 mg daily 01/09/18   Epifanio Lesches, MD  sertraline (ZOLOFT) 25 MG tablet Take by mouth. 03/02/18 03/02/19  [provider]  simvastatin (ZOCOR) 40 MG tablet Take 40 mg by mouth at  bedtime.    [provider]  sodium chloride 1 g tablet Take 2 tablets (2 g total) by mouth 2 (two) times daily with a meal. 01/09/18   Epifanio Lesches, MD  telmisartan (MICARDIS) 40 MG tablet Take by mouth.    [provider]  traZODone (DESYREL) 50 MG tablet Take 50 mg by mouth at bedtime. 06/15/20   [provider]  valACYclovir (VALTREX) 1000 MG tablet Take 1,000 mg by mouth 2 (two) times daily.    [provider]  vitamin E 1000 UNIT capsule Take by mouth.    [provider]  zolpidem (AMBIEN) 5 MG tablet Take 2.5 mg by mouth at bedtime. 04/01/18   [provider]    Pernell Dupre ,PharmD Clinical Pharmacist  06/27/2020  12:06 PM

## 2020-06-27 NOTE — ED Triage Notes (Signed)
Pt comes from [facility] cardiology for a routine check up and started having right sided facial droop and difficulty with speech at around 1045

## 2020-06-27 NOTE — ED Notes (Signed)
Pt assisted with standing, minimal assist

## 2020-06-27 NOTE — ED Provider Notes (Addendum)
[facility] Emergency Department Provider Note  ____________________________________________  Time seen: Approximately 11:20 PM  I have reviewed the triage vital signs and the nursing notes.   HISTORY  Chief Complaint Stroke Symptoms    HPI Darryl Novak is a 86 y.o. male with a history of prior TIAs, hypertension, CAD status post MI, on Eliquis who was in his usual state of health this morning, being seen in his cardiology clinic when he suddenly developed slurred speech, right facial droop, right-sided weakness at 10:45 AM.  He was brought to the ED for evaluation.  Code stroke initiated from triage and patient had immediate CT scan of the head.  Patient seen immediately upon arrival to ED treatment area from CT.  Pt only reports feeling 'tired.'  Symptoms have been constant, no aggravating or alleviating factors, but seem to be improving by the time they arrived in the emergency department.  Patient's daughter-in-law was with him at the time, she confirms acute onset of symptoms at 10:45 which now seem improving.   No recent falls or trauma.  No recent illness vomiting diarrhea chest pain shortness of breath.  No neck pain.      Past Medical History:  Diagnosis Date  . Heart trouble   . Hypertension   . MI (myocardial infarction) ([place]) 1978  . Neuropathy   . Shingles   . Squamous cell carcinoma of skin 09/05/2010   Left lat. neck infra-auricular. WD SCC. [facility]  . Squamous cell carcinoma of skin 08/18/2013   Right medial distal dorsum forearm. KA pattern. [facility]  . Squamous cell carcinoma of skin 10/03/2016   Left inferior sideburn. SCCis, hypertrophic. [facility] 01/30/2017  . Squamous cell carcinoma of skin 04/08/2018   Left forearm. WD SCC. Excised 05/06/2018, margins free.  Marland Kitchen Squamous cell carcinoma of skin 04/08/2018   Right upper arm. SCCis, hypertrophic.  Marland Kitchen Squamous cell carcinoma of skin 02/17/2019   Mid vertex scalp. SCCis. [facility].  . Stroke ([place])   .  Ulcer      Patient Active Problem List   Diagnosis Date Noted  . 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 Fever 01/02/2018  . Adult failure to thrive 12/31/2017  . Weakness generalized 12/27/2017  . Hypotension 12/27/2017  . Decubital ulcer 12/27/2017  . 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 Acute renal failure (ARF) ([place]) 11/13/2017  . Vision loss, bilateral 11/07/2017  . Chronic atrial fibrillation ([place]) 04/10/2016  . Obstructive sleep apnea syndrome 10/17/2015  . Cerebrovascular accident, old 09/19/2014  . Arteriosclerosis of coronary artery 03/28/2014  . HLD (hyperlipidemia) 03/28/2014  . BP (high blood pressure) 03/28/2014  . Addison anemia 03/28/2014  . Peripheral vascular disease ([place]) 03/28/2014  . Cerebrovascular accident (CVA) ([place]) 03/28/2014  . Atherosclerotic heart disease of native coronary artery without angina pectoris 03/28/2014     History reviewed. No pertinent surgical history.   Prior to Admission medications   Medication Sig Start Date End Date Taking? Authorizing Provider  ALPHA-LIPOIC ACID PO Take 300 mg by mouth daily.   Yes [provider]  apixaban (ELIQUIS) 5 MG TABS tablet Take 5 mg by mouth 2 (two) times daily.    Yes [provider]  Biotin 1000 MCG tablet Take 1,000 mcg by mouth daily.   Yes [provider]  Calcium Carb-Ergocalciferol 500-200 MG-UNIT TABS Take 1 tablet by mouth daily.    Yes [provider]  cetirizine (ZYRTEC) 10 MG tablet Take 10 mg by mouth daily.  08/12/18 06/27/20  Yes [provider]  cholecalciferol (VITAMIN D) 400 units TABS tablet Take 400 Units by mouth daily.   Yes [provider]  citalopram (CELEXA) 10 MG tablet Take 10 mg by mouth daily. 05/22/20  Yes [provider]  Coenzyme Q10 (CO Q-10) 100 MG CAPS Take 100 mg by mouth daily.    Yes [provider]  diphenhydrAMINE (BENADRYL) 25 mg capsule Take 50 mg by mouth at bedtime. 08/09/19   Yes [provider]  folic acid (FOLVITE) 1 MG tablet Take 1 mg by mouth daily.    Yes [provider]  furosemide (LASIX) 20 MG tablet Take 1 tablet by mouth daily. 04/17/20  Yes [provider]  gabapentin (NEURONTIN) 600 MG tablet Take 600 mg by mouth at bedtime.   Yes [provider]  Multiple Vitamin (MULTIVITAMIN) tablet Take 1 tablet by mouth daily.   Yes [provider]  potassium chloride (K-DUR) 10 MEQ tablet Take 10 mEq by mouth daily.    Yes [provider]  prednisoLONE acetate (PRED FORTE) 1 % ophthalmic suspension Place 1 drop into the right eye 2 (two) times daily.  10/05/18  Yes [provider]  sertraline (ZOLOFT) 25 MG tablet Take 25 mg by mouth daily.  03/02/18 06/27/20 Yes [provider]  simvastatin (ZOCOR) 40 MG tablet Take 40 mg by mouth at bedtime.   Yes [provider]  sodium chloride 1 g tablet Take 2 tablets (2 g total) by mouth 2 (two) times daily with a meal. 01/09/18  Yes Epifanio Lesches, MD  telmisartan (MICARDIS) 40 MG tablet Take 40 mg by mouth daily.    Yes [provider]  traZODone (DESYREL) 50 MG tablet Take 50 mg by mouth at bedtime. 06/15/20  Yes [provider]  vitamin E 1000 UNIT capsule Take 1,000 Units by mouth daily.    Yes [provider]  zolpidem (AMBIEN) 5 MG tablet Take 2.5 mg by mouth at bedtime. 04/01/18  Yes [provider]  acetaminophen (TYLENOL) 650 MG CR tablet Take 650 mg by mouth every 8 (eight) hours as needed.     [provider]  albuterol (VENTOLIN HFA) 108 (90 Base) MCG/ACT inhaler Inhale 2 puffs into the lungs every 6 (six) hours as needed.  06/09/20   [provider]  carboxymethylcellulose (REFRESH PLUS) 0.5 % SOLN INSTILL 1 DROP INTO THE RIGHT EYE, 4 TIMES PER DAY 06/08/20   [provider]  feeding supplement, ENSURE ENLIVE, (ENSURE ENLIVE) LIQD Take 237 mLs by mouth 3 (three) times daily  between meals. 11/19/17   Vaughan Basta, MD  loperamide (IMODIUM) 2 MG capsule Take 2 mg by mouth as needed.  07/16/19   [provider]  polyethylene glycol (MIRALAX / GLYCOLAX) packet Take 17 g by mouth daily. Patient not taking: Reported on 06/27/2020 11/19/17   Vaughan Basta, MD  predniSONE (STERAPRED UNI-PAK 21 TAB) 10 MG (21) TBPK tablet Taper by 10 mg daily Patient not taking: Reported on 06/27/2020 01/09/18   Epifanio Lesches, MD     Allergies Ace inhibitors   Family History  Problem Relation Age of Onset  . Heart attack Father     Social History Social History   Tobacco Use  . Smoking status: Former Smoker    Types: Cigarettes  . Smokeless tobacco: Never Used  . Tobacco comment: quit 45 years ago  Vaping Use  . Vaping Use: Never used  Substance Use Topics  . Alcohol use: Yes  Comment: 2 drinks a night  . Drug use: No    Review of Systems  Constitutional:   No fever or chills.  ENT:   No sore throat. No rhinorrhea. Cardiovascular:   No chest pain or syncope. Respiratory:   No dyspnea or cough. Gastrointestinal:   Negative for abdominal pain, vomiting and diarrhea.  Musculoskeletal:   Negative for focal pain or swelling All other systems reviewed and are negative except as documented above in ROS and HPI.  ____________________________________________   PHYSICAL EXAM:  VITAL SIGNS: ED Triage Vitals [06/27/20 1106]  Enc Vitals Group     BP (!) 114/54     Pulse Rate 73     Resp 16     Temp 98.7 F (37.1 C)     Temp Source Oral     SpO2 94 %     Weight      Height      Head Circumference      Peak Flow      Pain Score      Pain Loc      Pain Edu?      Excl. in [place]?     Vital signs reviewed, nursing assessments reviewed.   Constitutional:   Alert and oriented. Non-toxic appearance. Eyes:   Conjunctivae are normal. EOMI. PERRL. ENT      Head:   Normocephalic and atraumatic.      Nose:   Wearing a mask.       Mouth/Throat:   Wearing a mask.      Neck:   No meningismus. Full ROM. Hematological/Lymphatic/Immunilogical:   No cervical lymphadenopathy. Cardiovascular:   RRR. Symmetric bilateral radial and DP pulses.  No murmurs. Cap refill less than 2 seconds. Respiratory:   Normal respiratory effort without tachypnea/retractions. Breath sounds are clear and equal bilaterally. No wheezes/rales/rhonchi. Gastrointestinal:   Soft and nontender. Non distended. There is no CVA tenderness.  No rebound, rigidity, or guarding. Musculoskeletal:   Normal range of motion in all extremities. No joint effusions.  No lower extremity tenderness.  No edema. Neurologic:   Normal speech and language. Completes standard stroke phrases without difficulty Motor grossly intact. Endorses sensory change to right side NIH stroke scale =2 for paresthesia Skin:    Skin is warm, dry and intact. No rash noted.  No petechiae, purpura, or bullae.  ____________________________________________    LABS (pertinent positives/negatives) (all labs ordered are listed, but only abnormal results are displayed) Labs Reviewed  PROTIME-INR - Abnormal; Notable for the following components:      Result Value   Prothrombin Time 17.9 (*)    INR 1.5 (*)    All other components within normal limits  DIFFERENTIAL - Abnormal; Notable for the following components:   Eosinophils Absolute 0.6 (*)    All other components within normal limits  COMPREHENSIVE METABOLIC PANEL - Abnormal; Notable for the following components:   Sodium 132 (*)    Chloride 97 (*)    Creatinine, Ser 1.60 (*)    Calcium 8.6 (*)    GFR calc non Af Amer 37 (*)    GFR calc Af Amer 43 (*)    All other components within normal limits  GLUCOSE, CAPILLARY - Abnormal; Notable for the following components:   Glucose-Capillary 100 (*)    All other components within normal limits  APTT  CBC  CBG MONITORING, ED    ____________________________________________   EKG    ____________________________________________    RADIOLOGY  No results found.  ____________________________________________  PROCEDURES Procedures  ____________________________________________  DIFFERENTIAL DIAGNOSIS   Acute ischemic stroke, TIA, intracranial hemorrhage, fatigue  CLINICAL IMPRESSION / ASSESSMENT AND PLAN / ED COURSE  Medications ordered in the ED: Medications  gadobutrol (GADAVIST) 1 MMOL/ML injection 7 mL (7 mLs Intravenous Contrast Given 06/27/20 1349)    Pertinent labs & imaging results that were available during my care of the patient were reviewed by me and considered in my medical decision making (see chart for details).  Darryl Novak was evaluated in Emergency Department on 06/28/2020 for the symptoms described in the history of present illness. He was evaluated in the context of the global COVID-19 pandemic, which necessitated consideration that the patient might be at risk for infection with the SARS-CoV-2 virus that causes COVID-19. Institutional protocols and algorithms that pertain to the evaluation of patients at risk for COVID-19 are in a state of rapid change based on information released by regulatory bodies including the CDC and federal and state organizations. These policies and algorithms were followed during the patient's care in the ED.     Clinical Course as of Jun 28 2216  Tue Jun 27, 2020  1123 CT head reviewed by me, no obvious ICH or infarct. Unremarkable appearance.    [PS]  0071 CT head discussed with radiology who confirms no acute findings.  Patient symptoms appear to be improving with some persistent slurred speech and very slight right-sided weakness compared to left but without significant drift.  He does endorse a partial sensory deficit on the right.  Not a candidate for TPA due to Eliquis use.   [PS]  1133 Stroke coordinator Olivia at bedside   [PS]  1213  Stroke coordinator reports that Neuro Dr. Leonel Ramsay recommends MRI brain, MRA neck. If negative, pt can be discharged since he's already on Eliquis and can not advance anticoag therapy any further.   [PS]  1422 MRA neck unremarkable with moderate stenosis.  Stable for discharge and outpatient follow-up with primary care.   [PS]    Clinical Course User Index [PS] Carrie Mew, MD    ----------------------------------------- 2:06 PM on 06/27/2020 -----------------------------------------  Son and daughter-in-law at bedside aware of plan and in agreement.  MRI brain unremarkable.  MRA neck still pending.   ____________________________________________   FINAL CLINICAL IMPRESSION(S) / ED DIAGNOSES    Final diagnoses:  TIA (transient ischemic attack)     ED Discharge Orders    None      Portions of this note were generated with dragon dictation software. Dictation errors may occur despite best attempts at proofreading.   Carrie Mew, MD 06/27/20 1407    Carrie Mew, MD 06/28/20 2217

## 2020-06-27 NOTE — ED Notes (Signed)
Pt discharge with son, Darryl Novak via wheelchair. Walker and cap with family

## 2020-07-03 ENCOUNTER — Ambulatory Visit (INDEPENDENT_AMBULATORY_CARE_PROVIDER_SITE_OTHER): Payer: Medicare Other | Admitting: Dermatology

## 2020-07-03 ENCOUNTER — Other Ambulatory Visit: Payer: Self-pay

## 2020-07-03 DIAGNOSIS — L570 Actinic keratosis: Secondary | ICD-10-CM | POA: Diagnosis not present

## 2020-07-03 DIAGNOSIS — L578 Other skin changes due to chronic exposure to nonionizing radiation: Secondary | ICD-10-CM | POA: Diagnosis not present

## 2020-07-03 DIAGNOSIS — L821 Other seborrheic keratosis: Secondary | ICD-10-CM | POA: Diagnosis not present

## 2020-07-03 MED ORDER — FLUOROURACIL 5 % EX CREA: TOPICAL_CREAM | CUTANEOUS | 0 refills | Status: DC

## 2020-07-03 NOTE — Progress Notes (Signed)
   Follow-Up Visit   Subjective  Darryl Novak is a 86 y.o. male who presents for the following: Actinic Keratosis (on the L lower eyelid and scalp - recheck for new or persistent skin lesions) and lesions (B/L post auricular - irregular and irritated).  The following portions of the chart were reviewed this encounter and updated as appropriate:  Tobacco  Allergies  Meds  Problems  Med Hx  Surg Hx  Fam Hx     Review of Systems:  No other skin or systemic complaints except as noted in HPI or Assessment and Plan.  Objective  Well appearing patient in no apparent distress; mood and affect are within normal limits.  A focused examination was performed including the face, scalp, and neck . Relevant physical exam findings are noted in the Assessment and Plan.  Objective  Scalp and post auricular area (16): Erythematous thin papules/macules with gritty scale.   Objective  Left Lower Eyelid : Erythematous thin papules/macules with gritty scale.   Assessment & Plan  AK (actinic keratosis) (16) Scalp and post auricular area  Destruction of lesion - Scalp and post auricular area Complexity: simple   Destruction method: cryotherapy   Informed consent: discussed and consent obtained   Timeout:  patient name, date of birth, surgical site, and procedure verified Lesion destroyed using liquid nitrogen: Yes   Region frozen until ice ball extended beyond lesion: Yes   Outcome: patient tolerated procedure well with no complications   Post-procedure details: wound care instructions given    Keratosis, actinic Left Lower Eyelid  In one month start 5FU QD x 2 weeks. If too expensive plan to send 5FU/Calcipotriene mix to [facility].   Destruction of lesion - Left Lower Eyelid  Complexity: simple   Destruction method: cryotherapy   Informed consent: discussed and consent obtained   Timeout:  patient name, date of birth, surgical site, and procedure verified Lesion destroyed  using liquid nitrogen: Yes   Region frozen until ice ball extended beyond lesion: Yes   Outcome: patient tolerated procedure well with no complications   Post-procedure details: wound care instructions given    fluorouracil (EFUDEX) 5 % cream - Left Lower Eyelid    Actinic Damage - diffuse scaly erythematous macules with underlying dyspigmentation - Recommend daily broad spectrum sunscreen SPF 30+ to sun-exposed areas, reapply every 2 hours as needed.  - Call for new or changing lesions.  Seborrheic Keratoses - Stuck-on, waxy, tan-brown papules and plaques  - Discussed benign etiology and prognosis. - Observe - Call for any changes  Return in about 3 months (around 10/03/2020) for F/U appt. - recheck AK's.  [name], CMA, am acting as scribe for [name], MD .  Documentation: I have reviewed the above documentation for accuracy and completeness, and I agree with the above.  [name], MD

## 2020-07-04 ENCOUNTER — Encounter: Payer: Self-pay | Admitting: Dermatology

## 2020-07-27 IMAGING — DX DG CHEST 1V PORT
1 series · 1 of 1 positions shown · non-contrast
Comparison: 01/08/2018

CLINICAL DATA: Shortness of breath tonight. History of myocardial
infarct and hypertension

EXAM:
PORTABLE CHEST 1 VIEW

[chest ap]
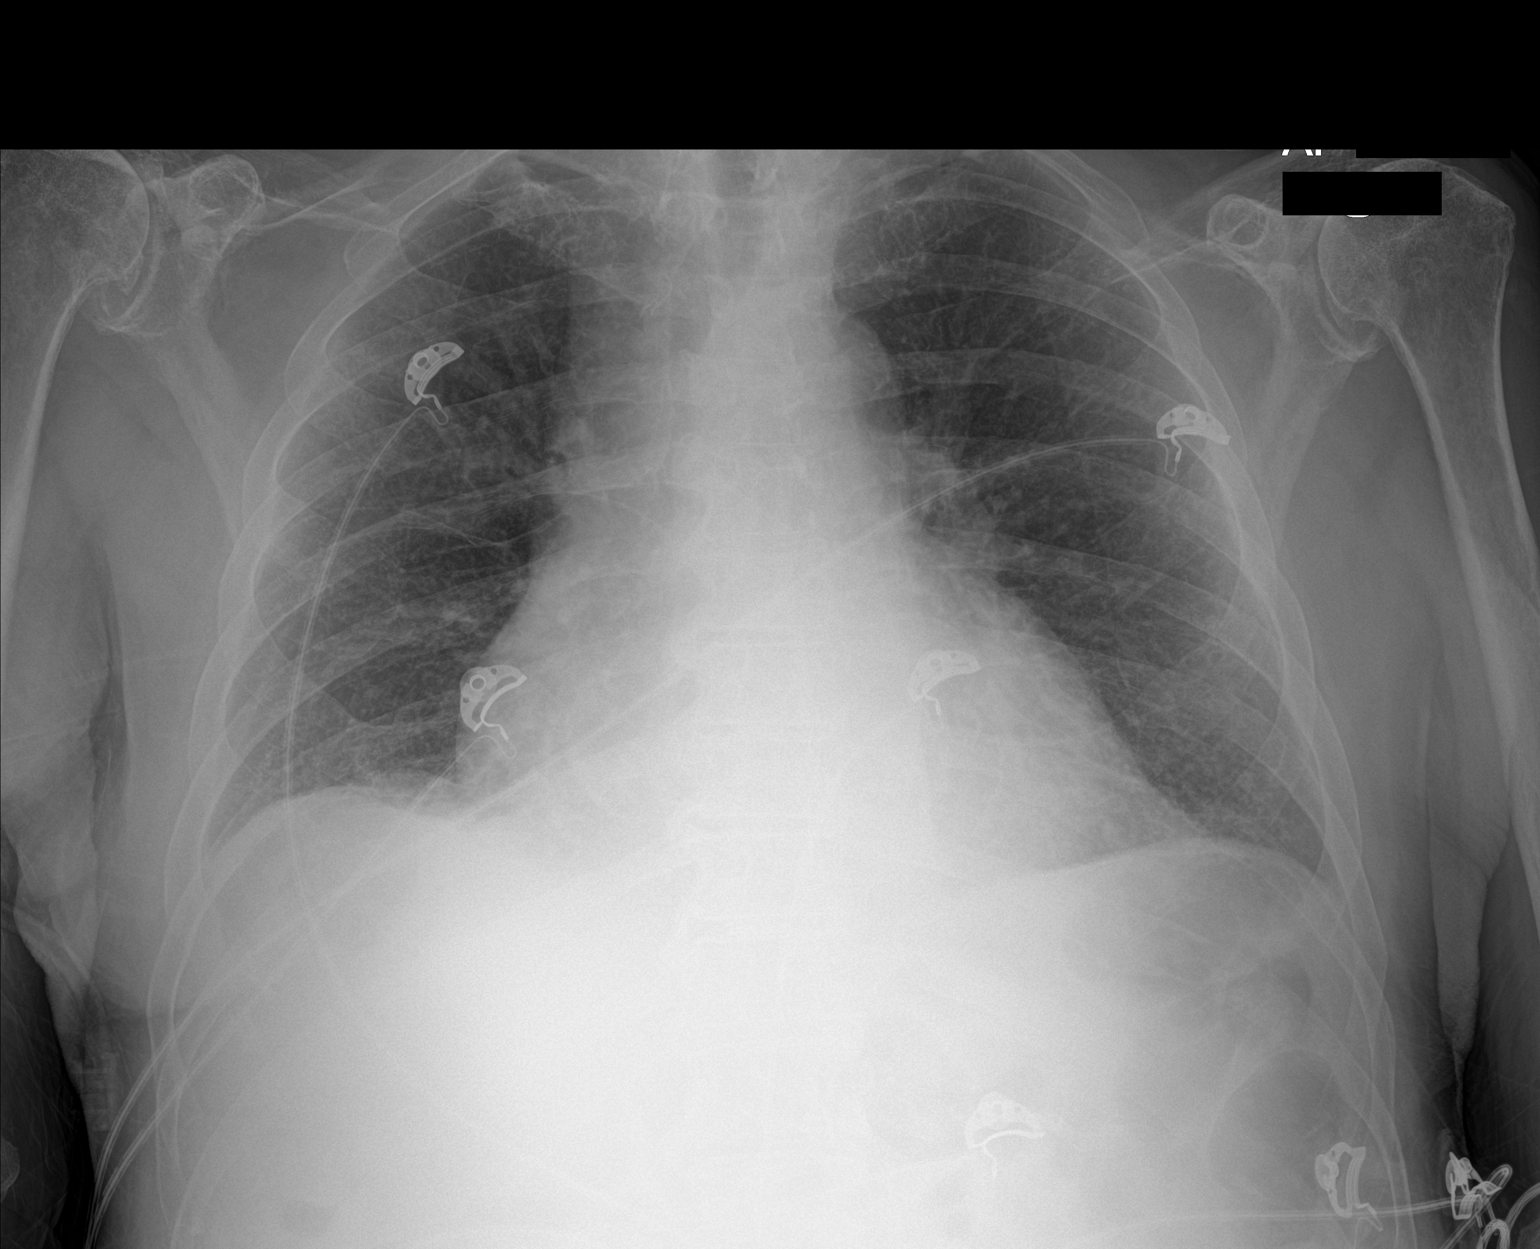

[1 of 1 positions shown; findings below may reference images not displayed]

FINDINGS: Shallow inspiration. Cardiac enlargement. No vascular congestion,
edema, or consolidation. No blunting of costophrenic angles. No
pneumothorax. Mediastinal contours appear intact. Degenerative
changes in the spine and shoulders.
IMPRESSION: Cardiac enlargement. No evidence of active pulmonary disease.

## 2020-08-28 ENCOUNTER — Other Ambulatory Visit: Payer: Self-pay

## 2020-08-28 ENCOUNTER — Ambulatory Visit (INDEPENDENT_AMBULATORY_CARE_PROVIDER_SITE_OTHER): Payer: Medicare Other | Admitting: Podiatry

## 2020-08-28 ENCOUNTER — Encounter: Payer: Self-pay | Admitting: Podiatry

## 2020-08-28 DIAGNOSIS — D689 Coagulation defect, unspecified: Secondary | ICD-10-CM | POA: Diagnosis not present

## 2020-08-28 DIAGNOSIS — B351 Tinea unguium: Secondary | ICD-10-CM

## 2020-08-28 DIAGNOSIS — I739 Peripheral vascular disease, unspecified: Secondary | ICD-10-CM | POA: Diagnosis not present

## 2020-08-28 DIAGNOSIS — N179 Acute kidney failure, unspecified: Secondary | ICD-10-CM | POA: Diagnosis not present

## 2020-08-28 DIAGNOSIS — M79674 Pain in right toe(s): Secondary | ICD-10-CM

## 2020-08-28 DIAGNOSIS — M79675 Pain in left toe(s): Secondary | ICD-10-CM

## 2020-08-28 NOTE — Progress Notes (Signed)
This patient returns to my office for at risk foot care.  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  Patient is taking eliquiss.  This patient is unable to cut nails himself since the patient cannot reach his nails.These nails are painful walking and wearing shoes.  This patient presents for at risk foot care today. He presents to the office  with his son .  General Appearance  Alert, conversant and in no acute stress.  Vascular  Dorsalis pedis and posterior tibial  pulses are weakly  palpable  bilaterally.  Capillary return is within normal limits  bilaterally. Cold feet.  Absent hair.  Neurologic  Senn-Weinstein monofilament wire test within normal limits  bilaterally. Muscle power within normal limits bilaterally.  Nails Thick disfigured discolored nails with subungual debris  from hallux to fifth toes bilaterally. No evidence of bacterial infection or drainage bilaterally.  Orthopedic  No limitations of motion  feet .  No crepitus or effusions noted.  No bony pathology or digital deformities noted.  Skin  normotropic skin with no porokeratosis noted bilaterally.  No signs of infections or ulcers noted.     Onychomycosis  Pain in right toes  Pain in left toes  Consent was obtained for treatment procedures.   Mechanical debridement of nails 1-5  bilaterally performed with a nail nipper.  Filed with dremel without incident.    Return office visit     10 weeks                 Told patient to return for periodic foot care and evaluation due to potential at risk complications.   Gardiner Barefoot DPM

## 2020-10-18 ENCOUNTER — Other Ambulatory Visit: Payer: Self-pay

## 2020-10-18 ENCOUNTER — Ambulatory Visit (INDEPENDENT_AMBULATORY_CARE_PROVIDER_SITE_OTHER): Payer: Medicare Other | Admitting: Dermatology

## 2020-10-18 DIAGNOSIS — L578 Other skin changes due to chronic exposure to nonionizing radiation: Secondary | ICD-10-CM | POA: Diagnosis not present

## 2020-10-18 DIAGNOSIS — L820 Inflamed seborrheic keratosis: Secondary | ICD-10-CM

## 2020-10-18 DIAGNOSIS — L570 Actinic keratosis: Secondary | ICD-10-CM

## 2020-10-18 NOTE — Patient Instructions (Signed)
Cryotherapy Aftercare  . Wash gently with soap and water everyday.   . Apply Vaseline and Band-Aid daily until healed.  

## 2020-10-18 NOTE — Progress Notes (Unsigned)
   Follow-Up Visit   Subjective  Darryl Novak is a 87 y.o. male who presents for the following: Actinic Keratosis (3 months f/u on Aks, treated with 5FU on the face and LN2 on scalp 3 months ago). Check irritated spots on the hands and wrist growing.   Son with pt who contributes to history  The following portions of the chart were reviewed this encounter and updated as appropriate:   Tobacco  Allergies  Meds  Problems  Med Hx  Surg Hx  Fam Hx     Review of Systems:  No other skin or systemic complaints except as noted in HPI or Assessment and Plan.  Objective  Well appearing patient in no apparent distress; mood and affect are within normal limits.  A focused examination was performed including face. Relevant physical exam findings are noted in the Assessment and Plan.  Objective  Scalp x 18, L medial cheek x 1 (19): Erythematous thin papules/macules with gritty scale.   Objective  Left Hand x 1, Right wrist x 1 (2): Erythematous keratotic or waxy stuck-on papule or plaque.    Assessment & Plan  AK (actinic keratosis) (19) Scalp x 18, L medial cheek x 1  Destruction of lesion - Scalp x 18, L medial cheek x 1 Complexity: simple   Destruction method: cryotherapy   Informed consent: discussed and consent obtained   Timeout:  patient name, date of birth, surgical site, and procedure verified Lesion destroyed using liquid nitrogen: Yes   Region frozen until ice ball extended beyond lesion: Yes   Outcome: patient tolerated procedure well with no complications   Post-procedure details: wound care instructions given    Inflamed seborrheic keratosis (2) Left Hand x 1, Right wrist x 1  Destruction of lesion - Left Hand x 1, Right wrist x 1 Complexity: simple   Destruction method: cryotherapy   Informed consent: discussed and consent obtained   Timeout:  patient name, date of birth, surgical site, and procedure verified Lesion destroyed using liquid nitrogen: Yes    Region frozen until ice ball extended beyond lesion: Yes   Outcome: patient tolerated procedure well with no complications   Post-procedure details: wound care instructions given    Actinic Damage - chronic, secondary to cumulative UV radiation exposure/sun exposure over time - diffuse scaly erythematous macules with underlying dyspigmentation - Recommend daily broad spectrum sunscreen SPF 30+ to sun-exposed areas, reapply every 2 hours as needed.  - Call for new or changing lesions.  Return in about 3 months (around 01/16/2021) for Aks .  IMarye Round, CMA, am acting as scribe for [name], MD .  Documentation: I have reviewed the above documentation for accuracy and completeness, and I agree with the above.  [name], MD

## 2020-10-22 ENCOUNTER — Encounter: Payer: Self-pay | Admitting: Dermatology

## 2020-11-06 ENCOUNTER — Ambulatory Visit (INDEPENDENT_AMBULATORY_CARE_PROVIDER_SITE_OTHER): Payer: Medicare Other | Admitting: Podiatry

## 2020-11-06 ENCOUNTER — Other Ambulatory Visit: Payer: Self-pay

## 2020-11-06 ENCOUNTER — Encounter: Payer: Self-pay | Admitting: Podiatry

## 2020-11-06 DIAGNOSIS — B351 Tinea unguium: Secondary | ICD-10-CM | POA: Diagnosis not present

## 2020-11-06 DIAGNOSIS — M79675 Pain in left toe(s): Secondary | ICD-10-CM

## 2020-11-06 DIAGNOSIS — D689 Coagulation defect, unspecified: Secondary | ICD-10-CM

## 2020-11-06 DIAGNOSIS — I739 Peripheral vascular disease, unspecified: Secondary | ICD-10-CM

## 2020-11-06 DIAGNOSIS — M79674 Pain in right toe(s): Secondary | ICD-10-CM

## 2020-11-06 DIAGNOSIS — N179 Acute kidney failure, unspecified: Secondary | ICD-10-CM | POA: Diagnosis not present

## 2020-11-06 NOTE — Progress Notes (Signed)
This patient returns to my office for at risk foot care.  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  Patient is taking eliquiss.  This patient is unable to cut nails himself since the patient cannot reach his nails.These nails are painful walking and wearing shoes.  This patient presents for at risk foot care today. He presents to the office  with his son .  General Appearance  Alert, conversant and in no acute stress.  Vascular  Dorsalis pedis and posterior tibial  pulses are weakly  palpable  bilaterally.  Capillary return is within normal limits  bilaterally. Cold feet.  Absent hair.  Neurologic  Senn-Weinstein monofilament wire test within normal limits  bilaterally. Muscle power within normal limits bilaterally.  Nails Thick disfigured discolored nails with subungual debris  from hallux to fifth toes bilaterally. No evidence of bacterial infection or drainage bilaterally.  Orthopedic  No limitations of motion  feet .  No crepitus or effusions noted.  No bony pathology or digital deformities noted.  Skin  normotropic skin with no porokeratosis noted bilaterally.  No signs of infections or ulcers noted.     Onychomycosis  Pain in right toes  Pain in left toes  Consent was obtained for treatment procedures.   Mechanical debridement of nails 1-5  bilaterally performed with a nail nipper.  Filed with dremel without incident.    Return office visit     12 weeks                 Told patient to return for periodic foot care and evaluation due to potential at risk complications.   Gardiner Barefoot DPM

## 2020-12-22 ENCOUNTER — Emergency Department
Admission: EM | Admit: 2020-12-22 | Discharge: 2020-12-22 | Disposition: A | Discharge status: Home / Self Care | Payer: Medicare Other | Attending: Emergency Medicine | Admitting: Emergency Medicine

## 2020-12-22 ENCOUNTER — Emergency Department: Payer: Medicare Other

## 2020-12-22 DIAGNOSIS — R0602 Shortness of breath: Secondary | ICD-10-CM | POA: Diagnosis not present

## 2020-12-22 DIAGNOSIS — Z85828 Personal history of other malignant neoplasm of skin: Secondary | ICD-10-CM | POA: Insufficient documentation

## 2020-12-22 DIAGNOSIS — Z79899 Other long term (current) drug therapy: Secondary | ICD-10-CM | POA: Insufficient documentation

## 2020-12-22 DIAGNOSIS — R0600 Dyspnea, unspecified: Secondary | ICD-10-CM | POA: Diagnosis not present

## 2020-12-22 DIAGNOSIS — Z20822 Contact with and (suspected) exposure to covid-19: Secondary | ICD-10-CM | POA: Diagnosis not present

## 2020-12-22 DIAGNOSIS — I10 Essential (primary) hypertension: Secondary | ICD-10-CM | POA: Diagnosis not present

## 2020-12-22 DIAGNOSIS — Z87891 Personal history of nicotine dependence: Secondary | ICD-10-CM | POA: Diagnosis not present

## 2020-12-22 DIAGNOSIS — R059 Cough, unspecified: Secondary | ICD-10-CM | POA: Insufficient documentation

## 2020-12-22 LAB — TROPONIN I (HIGH SENSITIVITY)
Troponin I (High Sensitivity): 14 ng/L (ref <18)
Troponin I (High Sensitivity): 15 ng/L (ref <18)

## 2020-12-22 LAB — COMPREHENSIVE METABOLIC PANEL
ALT: 13 U/L (ref 0–44)
AST: 38 U/L (ref 15–41)
Albumin: 4.4 g/dL (ref 3.5–5.0)
Alkaline Phosphatase: 72 U/L (ref 38–126)
Anion gap: 11 (ref 5–15)
BUN: 14 mg/dL (ref 8–23)
CO2: 26 mmol/L (ref 22–32)
Calcium: 9.6 mg/dL (ref 8.9–10.3)
Chloride: 95 mmol/L — ABNORMAL LOW (ref 98–111)
Creatinine, Ser: 1.33 mg/dL — ABNORMAL HIGH (ref 0.61–1.24)
GFR, Estimated: 50 mL/min — ABNORMAL LOW (ref >60)
Glucose, Bld: 122 mg/dL — ABNORMAL HIGH (ref 70–99)
Potassium: 4.3 mmol/L (ref 3.5–5.1)
Sodium: 132 mmol/L — ABNORMAL LOW (ref 135–145)
Total Bilirubin: 1.8 mg/dL — ABNORMAL HIGH (ref 0.3–1.2)
Total Protein: 8.2 g/dL — ABNORMAL HIGH (ref 6.5–8.1)

## 2020-12-22 LAB — CBC WITH DIFFERENTIAL/PLATELET
Abs Immature Granulocytes: 0.02 10*3/uL (ref 0.00–0.07)
Basophils Absolute: 0 10*3/uL (ref 0.0–0.1)
Basophils Relative: 0 %
Eosinophils Absolute: 0 10*3/uL (ref 0.0–0.5)
Eosinophils Relative: 0 %
HCT: 44.6 % (ref 39.0–52.0)
Hemoglobin: 15.7 g/dL (ref 13.0–17.0)
Immature Granulocytes: 0 %
Lymphocytes Relative: 17 %
Lymphs Abs: 1.5 10*3/uL (ref 0.7–4.0)
MCH: 33.3 pg (ref 26.0–34.0)
MCHC: 35.2 g/dL (ref 30.0–36.0)
MCV: 94.7 fL (ref 80.0–100.0)
Monocytes Absolute: 0.5 10*3/uL (ref 0.1–1.0)
Monocytes Relative: 5 %
Neutro Abs: 7 10*3/uL (ref 1.7–7.7)
Neutrophils Relative %: 78 %
Platelets: 133 10*3/uL — ABNORMAL LOW (ref 150–400)
RBC: 4.71 MIL/uL (ref 4.22–5.81)
RDW: 12.7 % (ref 11.5–15.5)
WBC: 9.1 10*3/uL (ref 4.0–10.5)
nRBC: 0 % (ref 0.0–0.2)

## 2020-12-22 LAB — RESP PANEL BY RT-PCR (FLU A&B, COVID) ARPGX2
Influenza A by PCR: NEGATIVE
Influenza B by PCR: NEGATIVE
SARS Coronavirus 2 by RT PCR: NEGATIVE

## 2020-12-22 NOTE — ED Triage Notes (Signed)
SOB X 1 DAY, PT BLIND,

## 2020-12-22 NOTE — ED Provider Notes (Signed)
[facility] Emergency Department Provider Note  ____________________________________________  Time seen: Approximately 3:20 PM  I have reviewed the triage vital signs and the nursing notes.   HISTORY  Chief Complaint Shortness of Breath (Difficulty catching breaTH, AT OLD HOUSE OF [place] X 1 DAY )    HPI Darryl Novak is a 87 y.o. male with a history of MI, hypertension who was brought to the ED due to shortness of breath for today.  Constant, no aggravating or alleviating factors.  Denies pain.  No falls.  No vomiting or diarrhea, no fever.  Patient does endorse chronic cough at baseline.  He is compliant with medications including Eliquis.      Past Medical History:  Diagnosis Date  . Heart trouble   . Hypertension   . MI (myocardial infarction) ([place]) 1978  . Neuropathy   . Shingles   . Squamous cell carcinoma of skin 09/05/2010   Left lat. neck infra-auricular. WD SCC. [facility]  . Squamous cell carcinoma of skin 08/18/2013   Right medial distal dorsum forearm. KA pattern. [facility]  . Squamous cell carcinoma of skin 10/03/2016   Left inferior sideburn. SCCis, hypertrophic. [facility] 01/30/2017  . Squamous cell carcinoma of skin 04/08/2018   Left forearm. WD SCC. Excised 05/06/2018, margins free.  Marland Kitchen Squamous cell carcinoma of skin 04/08/2018   Right upper arm. SCCis, hypertrophic.  Marland Kitchen Squamous cell carcinoma of skin 02/17/2019   Mid vertex scalp. SCCis. [facility].  . Stroke ([place])   . Ulcer      Patient Active Problem List   Diagnosis Date Noted  . 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 Fever 01/02/2018  . Adult failure to thrive 12/31/2017  . Weakness generalized 12/27/2017  . Hypotension 12/27/2017  . Decubital ulcer 12/27/2017  . 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 Acute renal failure (ARF) ([place]) 11/13/2017  . Vision loss, bilateral 11/07/2017  . Chronic atrial fibrillation ([place]) 04/10/2016  . Obstructive sleep apnea syndrome 10/17/2015   . Cerebrovascular accident, old 09/19/2014  . Arteriosclerosis of coronary artery 03/28/2014  . HLD (hyperlipidemia) 03/28/2014  . BP (high blood pressure) 03/28/2014  . Addison anemia 03/28/2014  . Peripheral vascular disease ([place]) 03/28/2014  . Cerebrovascular accident (CVA) ([place]) 03/28/2014  . Atherosclerotic heart disease of native coronary artery without angina pectoris 03/28/2014     History reviewed. No pertinent surgical history.   Prior to Admission medications   Medication Sig Start Date End Date Taking? Authorizing Provider  acetaminophen (TYLENOL) 650 MG CR tablet Take 650 mg by mouth every 8 (eight) hours as needed.     [provider]  albuterol (VENTOLIN HFA) 108 (90 Base) MCG/ACT inhaler Inhale 2 puffs into the lungs every 6 (six) hours as needed.  06/09/20   [provider]  ALPHA-LIPOIC ACID PO Take 300 mg by mouth daily.    [provider]  apixaban (ELIQUIS) 5 MG TABS tablet Take 5 mg by mouth 2 (two) times daily.     [provider]  Biotin 1000 MCG tablet Take 1,000 mcg by mouth daily.    [provider]  Calcium Carb-Ergocalciferol 500-200 MG-UNIT TABS Take 1 tablet by mouth daily.     [provider]  carboxymethylcellulose (REFRESH PLUS) 0.5 % SOLN INSTILL 1 DROP INTO THE RIGHT EYE, 4 TIMES PER DAY 06/08/20   [provider]  cetirizine (ZYRTEC) 10 MG tablet Take 1 tablet by mouth daily. 10/10/20   [provider]  cholecalciferol (VITAMIN D)  400 units TABS tablet Take 400 Units by mouth daily.    [provider]  citalopram (CELEXA) 10 MG tablet Take 10 mg by mouth daily. 05/22/20   [provider]  Coenzyme Q10 (CO Q-10) 100 MG CAPS Take 100 mg by mouth daily.     [provider]  diphenhydrAMINE (BENADRYL) 25 mg capsule Take 50 mg by mouth at bedtime. 08/09/19   [provider]  feeding supplement, ENSURE ENLIVE, (ENSURE ENLIVE) LIQD Take 237 mLs by mouth 3  (three) times daily between meals. 11/19/17   Vaughan Basta, MD  fluorouracil (EFUDEX) 5 % cream In one month - apply to rough area on left lower eyelid QD x 2 weeks. 07/03/20   Ralene Bathe, MD  folic acid (FOLVITE) 1 MG tablet Take 1 mg by mouth daily.     [provider]  furosemide (LASIX) 20 MG tablet Take 1 tablet by mouth daily. 04/17/20   [provider]  gabapentin (NEURONTIN) 600 MG tablet Take 600 mg by mouth at bedtime.    [provider]  loperamide (IMODIUM) 2 MG capsule Take 2 mg by mouth as needed.  07/16/19   [provider]  Multiple Vitamin (MULTIVITAMIN) tablet Take 1 tablet by mouth daily.    [provider]  polyethylene glycol (MIRALAX / GLYCOLAX) packet Take 17 g by mouth daily. 11/19/17   Vaughan Basta, MD  potassium chloride (KLOR-CON) 10 MEQ tablet Take 1 tablet by mouth daily. 07/21/20   [provider]  prednisoLONE acetate (PRED FORTE) 1 % ophthalmic suspension Place 1 drop into the right eye 2 (two) times daily.  10/05/18   [provider]  predniSONE (STERAPRED UNI-PAK 21 TAB) 10 MG (21) TBPK tablet Taper by 10 mg daily 01/09/18   Epifanio Lesches, MD  sertraline (ZOLOFT) 25 MG tablet Take 25 mg by mouth daily.  03/02/18 06/27/20  [provider]  simvastatin (ZOCOR) 40 MG tablet Take 40 mg by mouth at bedtime.    [provider]  sodium chloride 1 g tablet Take 2 tablets (2 g total) by mouth 2 (two) times daily with a meal. 01/09/18   Epifanio Lesches, MD  telmisartan (MICARDIS) 40 MG tablet Take 1 tablet by mouth daily. 07/25/20   [provider]  traZODone (DESYREL) 50 MG tablet Take 50 mg by mouth at bedtime. 06/15/20   [provider]  vitamin E 1000 UNIT capsule Take 1,000 Units by mouth daily.     [provider]  zolpidem (AMBIEN) 5 MG tablet Take 2.5 mg by mouth at bedtime. 04/01/18   [provider]     Allergies Ace  inhibitors   Family History  Problem Relation Age of Onset  . Heart attack Father     Social History Social History   Tobacco Use  . Smoking status: Former Smoker    Types: Cigarettes  . Smokeless tobacco: Never Used  . Tobacco comment: quit 45 years ago  Vaping Use  . Vaping Use: Never used  Substance Use Topics  . Alcohol use: Yes    Comment: 2 drinks a night  . Drug use: No    Review of Systems  Constitutional:   No fever or chills.  ENT:   No sore throat. No rhinorrhea. Cardiovascular:   No chest pain or syncope. Respiratory:   Positive shortness of breath and chronic cough. Gastrointestinal:   Negative for abdominal pain, vomiting and diarrhea.  Musculoskeletal:   Negative for focal pain  or swelling All other systems reviewed and are negative except as documented above in ROS and HPI.  ____________________________________________   PHYSICAL EXAM:  VITAL SIGNS: ED Triage Vitals  Enc Vitals Group     BP 12/22/20 1258 (!) 147/84     Pulse Rate 12/22/20 1258 91     Resp 12/22/20 1258 17     Temp 12/22/20 1301 98.8 F (37.1 C)     Temp Source 12/22/20 1301 Oral     SpO2 12/22/20 1258 97 %     Weight 12/22/20 1259 165 lb 5.5 oz (75 kg)     Height 12/22/20 1259 5\' 8"  (1.727 m)     Head Circumference --      Peak Flow --      Pain Score 12/22/20 1259 0     Pain Loc --      Pain Edu? --      Excl. in [place]? --     Vital signs reviewed, nursing assessments reviewed.   Constitutional:   Alert and oriented. Non-toxic appearance. Eyes:   Conjunctivae are normal.  ENT      Head:   Normocephalic and atraumatic.      Nose:   Wearing a mask.      Mouth/Throat:   Wearing a mask.      Neck:   No meningismus. Full ROM. Hematological/Lymphatic/Immunilogical:   No cervical lymphadenopathy. Cardiovascular:   RRR. Symmetric bilateral radial and DP pulses.  No murmurs. Cap refill less than 2 seconds. Respiratory:   Normal respiratory effort without  tachypnea/retractions. Breath sounds are clear and equal bilaterally. No wheezes/rales/rhonchi. Gastrointestinal:   Soft and nontender.  Mildly distended. There is no CVA tenderness.  No rebound, rigidity, or guarding. Genitourinary:   deferred Musculoskeletal:   Normal range of motion in all extremities. No joint effusions.  No lower extremity tenderness.  No edema. Neurologic:   Normal speech and language.  Motor grossly intact. No acute focal neurologic deficits are appreciated.  Skin:    Skin is warm, dry and intact. No rash noted.  No petechiae, purpura, or bullae.  ____________________________________________    LABS (pertinent positives/negatives) (all labs ordered are listed, but only abnormal results are displayed) Labs Reviewed  COMPREHENSIVE METABOLIC PANEL - Abnormal; Notable for the following components:      Result Value   Sodium 132 (*)    Chloride 95 (*)    Glucose, Bld 122 (*)    Creatinine, Ser 1.33 (*)    Total Protein 8.2 (*)    Total Bilirubin 1.8 (*)    GFR, Estimated 50 (*)    All other components within normal limits  CBC WITH DIFFERENTIAL/PLATELET - Abnormal; Notable for the following components:   Platelets 133 (*)    All other components within normal limits  RESP PANEL BY RT-PCR (FLU A&B, COVID) ARPGX2  TROPONIN I (HIGH SENSITIVITY)  TROPONIN I (HIGH SENSITIVITY)   ____________________________________________   EKG  Interpreted by me Atrial fibrillation, rate of 90, left axis, normal intervals.  Normal QRS ST segments and T waves.  ____________________________________________    RADIOLOGY  DG Abdomen Acute W/Chest  Result Date: 12/22/2020 CLINICAL DATA:  Shortness of breath and abdominal distension EXAM: DG ABDOMEN ACUTE WITH 1 VIEW CHEST COMPARISON:  Chest radiograph July 09, 2018 FINDINGS: AP chest: There is interstitial thickening without edema or consolidation. Heart size and pulmonary vascularity are normal. No adenopathy. There is  degenerative change in each shoulder. Supine and upright abdomen: There is moderate stool  in the colon. There is no bowel dilatation or air-fluid level to suggest bowel obstruction. No free air. Apparent penile prosthesis. IMPRESSION: No bowel obstruction or free air. Interstitial thickening may reflect chronic bronchitis. No edema or airspace opacity. Electronically Signed   By: Lowella Grip III M.D.   On: 12/22/2020 13:54    ____________________________________________   PROCEDURES Procedures  ____________________________________________  DIFFERENTIAL DIAGNOSIS   Pneumonia, dehydration, electrolyte abnormality, viral illness, COPD exacerbation  CLINICAL IMPRESSION / ASSESSMENT AND PLAN / ED COURSE  Medications ordered in the ED: Medications - No data to display  Pertinent labs & imaging results that were available during my care of the patient were reviewed by me and considered in my medical decision making (see chart for details).  Darryl Novak was evaluated in Emergency Department on 12/22/2020 for the symptoms described in the history of present illness. He was evaluated in the context of the global COVID-19 pandemic, which necessitated consideration that the patient might be at risk for infection with the SARS-CoV-2 virus that causes COVID-19. Institutional protocols and algorithms that pertain to the evaluation of patients at risk for COVID-19 are in a state of rapid change based on information released by regulatory bodies including the CDC and federal and state organizations. These policies and algorithms were followed during the patient's care in the ED.   Patient presents with shortness of breath.  Normal vital signs, no pain.  Nontoxic appearance and reassuring exam.  Abdomen and chest x-ray viewed and interpreted by me, appears unremarkable.  Radiology report confirms no acute findings.  Covid flu negative  Initial troponin normal.  Will await repeat troponin due to  his age, if not significantly elevated patient be stable for discharge home and follow-up with primary care.  Care signed out to Dr. Archie Balboa      ____________________________________________   FINAL CLINICAL IMPRESSION(S) / ED DIAGNOSES    Final diagnoses:  Dyspnea, unspecified type     ED Discharge Orders    None      Portions of this note were generated with dragon dictation software. Dictation errors may occur despite best attempts at proofreading.   Carrie Mew, MD 12/22/20 [phone]

## 2020-12-22 NOTE — ED Notes (Signed)
pts poa son jakolby notified and taking patient out of ED

## 2020-12-22 NOTE — Discharge Instructions (Signed)
Your lab tests and chest xray are okay today.  It is unclear why you are feeling short of breath today, but your evaluation is generally reassuring.  Continue to take your medications and follow up with your doctor next week.  Return to the ER if you have new or worsening symptoms.

## 2021-01-17 ENCOUNTER — Ambulatory Visit (INDEPENDENT_AMBULATORY_CARE_PROVIDER_SITE_OTHER): Payer: Medicare Other | Admitting: Dermatology

## 2021-01-17 ENCOUNTER — Other Ambulatory Visit: Payer: Self-pay

## 2021-01-17 DIAGNOSIS — L820 Inflamed seborrheic keratosis: Secondary | ICD-10-CM | POA: Diagnosis not present

## 2021-01-17 DIAGNOSIS — L570 Actinic keratosis: Secondary | ICD-10-CM

## 2021-01-17 DIAGNOSIS — L578 Other skin changes due to chronic exposure to nonionizing radiation: Secondary | ICD-10-CM | POA: Diagnosis not present

## 2021-01-17 NOTE — Progress Notes (Signed)
   Follow-Up Visit   Subjective  Darryl Novak is a 87 y.o. male who presents for the following: Actinic Keratosis (Scalp, face, 58m f/u) and check spots (Bil post auriculars/L arm, 78m, sore).  Patient accompanied by son, who contributes to history.  The following portions of the chart were reviewed this encounter and updated as appropriate:   Tobacco  Allergies  Meds  Problems  Med Hx  Surg Hx  Fam Hx      Review of Systems:  No other skin or systemic complaints except as noted in HPI or Assessment and Plan.  Objective  Well appearing patient in no apparent distress; mood and affect are within normal limits.  A focused examination was performed including face, post auriculars, L arm. Relevant physical exam findings are noted in the Assessment and Plan.  Objective  Scalp/face x 15 (15): Pink scaly macules   Objective  L postauricular x 1, R postauricular x 1 (2): Erythematous keratotic or waxy stuck-on papule or plaque.   Objective  Left Forearm x 1: Hyperkeratotic scaly macule   Assessment & Plan    Actinic Damage - chronic, secondary to cumulative UV radiation exposure/sun exposure over time - diffuse scaly erythematous macules with underlying dyspigmentation - Recommend daily broad spectrum sunscreen SPF 30+ to sun-exposed areas, reapply every 2 hours as needed.  - Recommend staying in the shade or wearing long sleeves, sun glasses (UVA+UVB protection) and wide brim hats (4-inch brim around the entire circumference of the hat). - Call for new or changing lesions.  AK (actinic keratosis) (15) Scalp/face x 15  Destruction of lesion - Scalp/face x 15 Complexity: simple   Destruction method: cryotherapy   Informed consent: discussed and consent obtained   Timeout:  patient name, date of birth, surgical site, and procedure verified Lesion destroyed using liquid nitrogen: Yes   Region frozen until ice ball extended beyond lesion: Yes   Outcome: patient  tolerated procedure well with no complications   Post-procedure details: wound care instructions given    Inflamed seborrheic keratosis (2) L postauricular x 1, R postauricular x 1  Destruction of lesion - L postauricular x 1, R postauricular x 1 Complexity: simple   Destruction method: cryotherapy   Informed consent: discussed and consent obtained   Timeout:  patient name, date of birth, surgical site, and procedure verified Lesion destroyed using liquid nitrogen: Yes   Region frozen until ice ball extended beyond lesion: Yes   Outcome: patient tolerated procedure well with no complications   Post-procedure details: wound care instructions given    Hypertrophic actinic keratosis Left Forearm x 1  Destruction of lesion - Left Forearm x 1 Complexity: simple   Destruction method: cryotherapy   Informed consent: discussed and consent obtained   Timeout:  patient name, date of birth, surgical site, and procedure verified Lesion destroyed using liquid nitrogen: Yes   Region frozen until ice ball extended beyond lesion: Yes   Outcome: patient tolerated procedure well with no complications   Post-procedure details: wound care instructions given    Return in about 4 months (around 05/19/2021) for AK f/u face, scalp, L forearm.   I, Othelia Pulling, RMA, am acting as scribe for [name], MD .  Documentation: I have reviewed the above documentation for accuracy and completeness, and I agree with the above.  [name], MD

## 2021-01-21 ENCOUNTER — Encounter: Payer: Self-pay | Admitting: Dermatology

## 2021-02-05 ENCOUNTER — Ambulatory Visit (INDEPENDENT_AMBULATORY_CARE_PROVIDER_SITE_OTHER): Payer: Medicare Other | Admitting: Podiatry

## 2021-02-05 ENCOUNTER — Other Ambulatory Visit: Payer: Self-pay

## 2021-02-05 ENCOUNTER — Encounter: Payer: Self-pay | Admitting: Podiatry

## 2021-02-05 DIAGNOSIS — D689 Coagulation defect, unspecified: Secondary | ICD-10-CM | POA: Diagnosis not present

## 2021-02-05 DIAGNOSIS — M79674 Pain in right toe(s): Secondary | ICD-10-CM

## 2021-02-05 DIAGNOSIS — I739 Peripheral vascular disease, unspecified: Secondary | ICD-10-CM

## 2021-02-05 DIAGNOSIS — N179 Acute kidney failure, unspecified: Secondary | ICD-10-CM

## 2021-02-05 DIAGNOSIS — M79675 Pain in left toe(s): Secondary | ICD-10-CM

## 2021-02-05 DIAGNOSIS — B351 Tinea unguium: Secondary | ICD-10-CM | POA: Diagnosis not present

## 2021-02-05 NOTE — Progress Notes (Signed)
This patient returns to my office for at risk foot care.  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  Patient is taking eliquiss.  This patient is unable to cut nails himself since the patient cannot reach his nails.These nails are painful walking and wearing shoes.  This patient presents for at risk foot care today. He presents to the office  with his son .  General Appearance  Alert, conversant and in no acute stress.  Vascular  Dorsalis pedis and posterior tibial  pulses are weakly  palpable  bilaterally.  Capillary return is within normal limits  bilaterally. Cold feet.  Absent hair.  Neurologic  Senn-Weinstein monofilament wire test within normal limits  bilaterally. Muscle power within normal limits bilaterally.  Nails Thick disfigured discolored nails with subungual debris  from hallux to fifth toes bilaterally. No evidence of bacterial infection or drainage bilaterally.  Orthopedic  No limitations of motion  feet .  No crepitus or effusions noted.  No bony pathology or digital deformities noted.  Skin  thin skin with no porokeratosis noted bilaterally.  No signs of infections or ulcers noted.     Onychomycosis  Pain in right toes  Pain in left toes  Consent was obtained for treatment procedures.   Mechanical debridement of nails 1-5  bilaterally performed with a nail nipper.  Filed with dremel without incident.    Return office visit     12 weeks                 Told patient to return for periodic foot care and evaluation due to potential at risk complications.   Gardiner Barefoot DPM

## 2021-05-10 ENCOUNTER — Other Ambulatory Visit: Payer: Self-pay

## 2021-05-10 ENCOUNTER — Encounter: Payer: Self-pay | Admitting: Podiatry

## 2021-05-10 ENCOUNTER — Ambulatory Visit (INDEPENDENT_AMBULATORY_CARE_PROVIDER_SITE_OTHER): Payer: Medicare Other | Admitting: Podiatry

## 2021-05-10 DIAGNOSIS — B351 Tinea unguium: Secondary | ICD-10-CM

## 2021-05-10 DIAGNOSIS — M79675 Pain in left toe(s): Secondary | ICD-10-CM

## 2021-05-10 DIAGNOSIS — I739 Peripheral vascular disease, unspecified: Secondary | ICD-10-CM

## 2021-05-10 DIAGNOSIS — M79674 Pain in right toe(s): Secondary | ICD-10-CM | POA: Diagnosis not present

## 2021-05-10 DIAGNOSIS — N179 Acute kidney failure, unspecified: Secondary | ICD-10-CM

## 2021-05-10 DIAGNOSIS — D689 Coagulation defect, unspecified: Secondary | ICD-10-CM

## 2021-05-10 NOTE — Progress Notes (Signed)
This patient returns to my office for at risk foot care.  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  Patient is taking eliquiss.  This patient is unable to cut nails himself since the patient cannot reach his nails.These nails are painful walking and wearing shoes.  This patient presents for at risk foot care today. He presents to the office  with his son .  General Appearance  Alert, conversant and in no acute stress.  Vascular  Dorsalis pedis and posterior tibial  pulses are weakly  palpable  bilaterally.  Capillary return is within normal limits  bilaterally. Cold feet.  Absent hair.  Neurologic  Senn-Weinstein monofilament wire test within normal limits  bilaterally. Muscle power within normal limits bilaterally.  Nails Thick disfigured discolored nails with subungual debris  from hallux to fifth toes bilaterally. No evidence of bacterial infection or drainage bilaterally.  Orthopedic  No limitations of motion  feet .  No crepitus or effusions noted.  No bony pathology or digital deformities noted.  Skin  thin skin with no porokeratosis noted bilaterally.  No signs of infections or ulcers noted.     Onychomycosis  Pain in right toes  Pain in left toes  Consent was obtained for treatment procedures.   Mechanical debridement of nails 1-5  bilaterally performed with a nail nipper.  Filed with dremel without incident.    Return office visit     12 weeks                 Told patient to return for periodic foot care and evaluation due to potential at risk complications.   Gardiner Barefoot DPM

## 2021-05-28 ENCOUNTER — Other Ambulatory Visit: Payer: Self-pay | Admitting: Dermatology

## 2021-05-28 ENCOUNTER — Other Ambulatory Visit: Payer: Self-pay

## 2021-05-28 ENCOUNTER — Ambulatory Visit (INDEPENDENT_AMBULATORY_CARE_PROVIDER_SITE_OTHER): Payer: Medicare Other | Admitting: Dermatology

## 2021-05-28 DIAGNOSIS — L578 Other skin changes due to chronic exposure to nonionizing radiation: Secondary | ICD-10-CM | POA: Diagnosis not present

## 2021-05-28 DIAGNOSIS — L570 Actinic keratosis: Secondary | ICD-10-CM | POA: Diagnosis not present

## 2021-05-28 DIAGNOSIS — C44629 Squamous cell carcinoma of skin of left upper limb, including shoulder: Secondary | ICD-10-CM | POA: Diagnosis not present

## 2021-05-28 DIAGNOSIS — L820 Inflamed seborrheic keratosis: Secondary | ICD-10-CM | POA: Diagnosis not present

## 2021-05-28 DIAGNOSIS — L821 Other seborrheic keratosis: Secondary | ICD-10-CM

## 2021-05-28 DIAGNOSIS — D485 Neoplasm of uncertain behavior of skin: Secondary | ICD-10-CM

## 2021-05-28 NOTE — Patient Instructions (Addendum)
If you have any questions or concerns for your doctor, please call our main line at [phone] and press option 4 to reach your doctor's medical assistant. If no one answers, please leave a voicemail as directed and we will return your call as soon as possible. Messages left after 4 pm will be answered the following business day.   You may also send [place] a message via [place]. We typically respond to MyChart messages within 1-2 business days.  For prescription refills, please ask your pharmacy to contact our office. Our fax number is [phone].  If you have an urgent issue when the clinic is closed that cannot wait until the next business day, you can page your doctor at the number below.    Please note that while we do our best to be available for urgent issues outside of office hours, we are not available 24/7.   If you have an urgent issue and are unable to reach [place], you may choose to seek medical care at your doctor's office, retail clinic, urgent care center, or emergency room.  If you have a medical emergency, please immediately call 911 or go to the emergency department.  Pager Numbers  - Dr. Nehemiah Massed: [phone]  - Dr. Laurence Ferrari: [phone]  - Dr. Nicole Kindred: [phone]  In the event of inclement weather, please call our main line at [phone] for an update on the status of any delays or closures.  Dermatology Medication Tips: Please keep the boxes that topical medications come in in order to help keep track of the instructions about where and how to use these. Pharmacies typically print the medication instructions only on the boxes and not directly on the medication tubes.   If your medication is too expensive, please contact our office at [phone] option 4 or send [place] a message through [place].   We are unable to tell what your co-pay for medications will be in advance as this is different depending on your insurance coverage. However, we may be able to find a substitute  medication at lower cost or fill out paperwork to get insurance to cover a needed medication.   If a prior authorization is required to get your medication covered by your insurance company, please allow [place] 1-2 business days to complete this process.  Drug prices often vary depending on where the prescription is filled and some pharmacies may offer cheaper prices.  The website www.goodrx.com contains coupons for medications through different pharmacies. The prices here do not account for what the cost may be with help from insurance (it may be cheaper with your insurance), but the website can give you the price if you did not use any insurance.  - You can print the associated coupon and take it with your prescription to the pharmacy.  - You may also stop by our office during regular business hours and pick up a GoodRx coupon card.  - If you need your prescription sent electronically to a different pharmacy, notify our office through [facility] or by phone at [phone] option 4. Electrodesiccation and Curettage ("Scrape and Burn") Wound Care Instructions  Leave the original bandage on for 24 hours if possible.  If the bandage becomes soaked or soiled before that time, it is OK to remove it and examine the wound.  A small amount of post-operative bleeding is normal.  If excessive bleeding occurs, remove the bandage, place gauze over the site and apply continuous pressure (no peeking) over the area for 30 minutes. If  this does not work, please call our clinic as soon as possible or page your doctor if it is after hours.   Once a day, cleanse the wound with soap and water. It is fine to shower. If a thick crust develops you may use a Q-tip dipped into dilute hydrogen peroxide (mix 1:1 with water) to dissolve it.  Hydrogen peroxide can slow the healing process, so use it only as needed.    After washing, apply petroleum jelly (Vaseline) or an antibiotic ointment if your doctor prescribed one  for you, followed by a bandage.    For best healing, the wound should be covered with a layer of ointment at all times. If you are not able to keep the area covered with a bandage to hold the ointment in place, this may mean re-applying the ointment several times a day.  Continue this wound care until the wound has healed and is no longer open. It may take several weeks for the wound to heal and close.  Itching and mild discomfort is normal during the healing process.  If you have any discomfort, you can take Tylenol (acetaminophen) or ibuprofen as directed on the bottle. (Please do not take these if you have an allergy to them or cannot take them for another reason).  Some redness, tenderness and white or yellow material in the wound is normal healing.  If the area becomes very sore and red, or develops a thick yellow-green material (pus), it may be infected; please notify us.    Wound healing continues for up to one year following surgery. It is not unusual to experience pain in the scar from time to time during the interval.  If the pain becomes severe or the scar thickens, you should notify the office.    A slight amount of redness in a scar is expected for the first six months.  After six months, the redness will fade and the scar will soften and fade.  The color difference becomes less noticeable with time.  If there are any problems, return for a post-op surgery check at your earliest convenience.  To improve the appearance of the scar, you can use silicone scar gel, cream, or sheets (such as Mederma or Serica) every night for up to one year. These are available over the counter (without a prescription).  Please call our office at [phone] for any questions or concerns.

## 2021-05-28 NOTE — Progress Notes (Signed)
Follow-Up Visit   Subjective  Darryl Novak is a 87 y.o. male who presents for the following: skin lesion (On the L arm - previously tx with LN2. Not resolved, very tender.) and AK's  (Of the face and scalp - patient is here today for a check of new and persistent skin lesions.).  The following portions of the chart were reviewed this encounter and updated as appropriate:   Tobacco  Allergies  Meds  Problems  Med Hx  Surg Hx  Fam Hx     Review of Systems:  No other skin or systemic complaints except as noted in HPI or Assessment and Plan.  Objective  Well appearing patient in no apparent distress; mood and affect are within normal limits.  A focused examination was performed including the face, scalp, arms, and hands. Relevant physical exam findings are noted in the Assessment and Plan.  L forearm Crusted papule 0.7cm.  scalp and face x 17 (17) Erythematous thin papules/macules with gritty scale.   Face and scalp x 2 (2) Erythematous keratotic or waxy stuck-on papule or plaque.   Assessment & Plan  Neoplasm of uncertain behavior of skin L forearm  Epidermal / dermal shaving  Lesion diameter (cm):  0.7 Informed consent: discussed and consent obtained   Timeout: patient name, date of birth, surgical site, and procedure verified   Procedure prep:  Patient was prepped and draped in usual sterile fashion Prep type:  Isopropyl alcohol Anesthesia: the lesion was anesthetized in a standard fashion   Anesthetic:  1% lidocaine w/ epinephrine 1-100,000 buffered w/ 8.4% NaHCO3 Instrument used: flexible razor blade   Hemostasis achieved with: pressure, aluminum chloride and electrodesiccation   Outcome: patient tolerated procedure well   Post-procedure details: sterile dressing applied and wound care instructions given   Dressing type: bandage and petrolatum    Destruction of lesion Complexity: extensive   Destruction method: electrodesiccation and curettage   Informed  consent: discussed and consent obtained   Timeout:  patient name, date of birth, surgical site, and procedure verified Procedure prep:  Patient was prepped and draped in usual sterile fashion Prep type:  Isopropyl alcohol Anesthesia: the lesion was anesthetized in a standard fashion   Anesthetic:  1% lidocaine w/ epinephrine 1-100,000 buffered w/ 8.4% NaHCO3 Curettage performed in three different directions: Yes   Electrodesiccation performed over the curetted area: Yes   Lesion length (cm):  0.7 Lesion width (cm):  0.7 Margin per side (cm):  0.2 Final wound size (cm):  1.1 Hemostasis achieved with:  pressure, aluminum chloride and electrodesiccation Outcome: patient tolerated procedure well with no complications   Post-procedure details: sterile dressing applied and wound care instructions given   Dressing type: bandage and petrolatum    Specimen 1 - Surgical pathology Differential Diagnosis: D48.5 r/o ISK vs SCC Check Margins: No ED&C today   AK (actinic keratosis) (17) scalp and face x 17  Destruction of lesion - scalp and face x 17 Complexity: simple   Destruction method: cryotherapy   Informed consent: discussed and consent obtained   Timeout:  patient name, date of birth, surgical site, and procedure verified Lesion destroyed using liquid nitrogen: Yes   Region frozen until ice ball extended beyond lesion: Yes   Outcome: patient tolerated procedure well with no complications   Post-procedure details: wound care instructions given    Inflamed seborrheic keratosis Face and scalp x 2  Destruction of lesion - Face and scalp x 2 Complexity: simple   Destruction method:  cryotherapy   Informed consent: discussed and consent obtained   Timeout:  patient name, date of birth, surgical site, and procedure verified Lesion destroyed using liquid nitrogen: Yes   Region frozen until ice ball extended beyond lesion: Yes   Outcome: patient tolerated procedure well with no  complications   Post-procedure details: wound care instructions given    Actinic Damage - chronic, secondary to cumulative UV radiation exposure/sun exposure over time - diffuse scaly erythematous macules with underlying dyspigmentation - Recommend daily broad spectrum sunscreen SPF 30+ to sun-exposed areas, reapply every 2 hours as needed.  - Recommend staying in the shade or wearing long sleeves, sun glasses (UVA+UVB protection) and wide brim hats (4-inch brim around the entire circumference of the hat). - Call for new or changing lesions.  Seborrheic Keratoses - Stuck-on, waxy, tan-brown papules and/or plaques  - Benign-appearing - Discussed benign etiology and prognosis. - Observe - Call for any changes  Return in about 6 months (around 11/27/2021).  [name], CMA, am acting as scribe for Darryl Ser, MD . Documentation: I have reviewed the above documentation for accuracy and completeness, and I agree with the above.  Darryl Ser, MD

## 2021-05-29 ENCOUNTER — Encounter: Payer: Self-pay | Admitting: Dermatology

## 2021-05-31 ENCOUNTER — Telehealth: Payer: Self-pay

## 2021-05-31 NOTE — Telephone Encounter (Signed)
Patient advised of BX results.  °

## 2021-05-31 NOTE — Telephone Encounter (Signed)
Left message for patient to call office for results/hd 

## 2021-05-31 NOTE — Telephone Encounter (Signed)
-----   Message from Ralene Bathe, MD sent at 05/30/2021  5:45 PM EDT ----- Diagnosis Skin , left forearm WELL DIFFERENTIATED SQUAMOUS CELL CARCINOMA, CRUSTED  Cancer - SCC Already treated Recheck next visit

## 2021-08-16 ENCOUNTER — Ambulatory Visit (INDEPENDENT_AMBULATORY_CARE_PROVIDER_SITE_OTHER): Payer: Medicare Other | Admitting: Podiatry

## 2021-08-16 ENCOUNTER — Encounter: Payer: Self-pay | Admitting: Podiatry

## 2021-08-16 ENCOUNTER — Other Ambulatory Visit: Payer: Self-pay

## 2021-08-16 DIAGNOSIS — M79675 Pain in left toe(s): Secondary | ICD-10-CM

## 2021-08-16 DIAGNOSIS — B351 Tinea unguium: Secondary | ICD-10-CM | POA: Diagnosis not present

## 2021-08-16 DIAGNOSIS — M79674 Pain in right toe(s): Secondary | ICD-10-CM

## 2021-08-16 DIAGNOSIS — D689 Coagulation defect, unspecified: Secondary | ICD-10-CM

## 2021-08-16 DIAGNOSIS — I739 Peripheral vascular disease, unspecified: Secondary | ICD-10-CM

## 2021-08-16 DIAGNOSIS — N179 Acute kidney failure, unspecified: Secondary | ICD-10-CM

## 2021-08-16 NOTE — Progress Notes (Signed)
This patient returns to my office for at risk foot care.  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  Patient is taking eliquiss.  This patient is unable to cut nails himself since the patient cannot reach his nails.These nails are painful walking and wearing shoes.  This patient presents for at risk foot care today. He presents to the office  with his son .  General Appearance  Alert, conversant and in no acute stress.  Vascular  Dorsalis pedis and posterior tibial  pulses are weakly  palpable  bilaterally.  Capillary return is within normal limits  bilaterally. Cold feet.  Absent hair.  Neurologic  Senn-Weinstein monofilament wire test within normal limits  bilaterally. Muscle power within normal limits bilaterally.  Nails Thick disfigured discolored nails with subungual debris  from hallux to fifth toes bilaterally. No evidence of bacterial infection or drainage bilaterally.  Orthopedic  No limitations of motion  feet .  No crepitus or effusions noted.  No bony pathology or digital deformities noted.  Skin  thin skin with no porokeratosis noted bilaterally.  No signs of infections or ulcers noted.     Onychomycosis  Pain in right toes  Pain in left toes  Consent was obtained for treatment procedures.   Mechanical debridement of nails 1-5  bilaterally performed with a nail nipper.  Filed with dremel without incident.    Return office visit     12 weeks                 Told patient to return for periodic foot care and evaluation due to potential at risk complications.   Gardiner Barefoot DPM

## 2021-10-28 ENCOUNTER — Emergency Department: Payer: Medicare Other

## 2021-10-28 ENCOUNTER — Emergency Department
Admission: EM | Admit: 2021-10-28 | Discharge: 2021-10-28 | Disposition: A | Discharge status: Skilled Nursing Facility | Payer: Medicare Other | Attending: Emergency Medicine | Admitting: Emergency Medicine

## 2021-10-28 ENCOUNTER — Encounter: Payer: Self-pay | Admitting: Emergency Medicine

## 2021-10-28 ENCOUNTER — Other Ambulatory Visit: Payer: Self-pay

## 2021-10-28 DIAGNOSIS — R531 Weakness: Secondary | ICD-10-CM

## 2021-10-28 DIAGNOSIS — U071 COVID-19: Secondary | ICD-10-CM | POA: Insufficient documentation

## 2021-10-28 LAB — CBC WITH DIFFERENTIAL/PLATELET
Abs Immature Granulocytes: 0.01 10*3/uL (ref 0.00–0.07)
Basophils Absolute: 0.1 10*3/uL (ref 0.0–0.1)
Basophils Relative: 1 %
Eosinophils Absolute: 0.2 10*3/uL (ref 0.0–0.5)
Eosinophils Relative: 2 %
HCT: 43.3 % (ref 39.0–52.0)
Hemoglobin: 14.2 g/dL (ref 13.0–17.0)
Immature Granulocytes: 0 %
Lymphocytes Relative: 18 %
Lymphs Abs: 1.2 10*3/uL (ref 0.7–4.0)
MCH: 32.7 pg (ref 26.0–34.0)
MCHC: 32.8 g/dL (ref 30.0–36.0)
MCV: 99.8 fL (ref 80.0–100.0)
Monocytes Absolute: 0.9 10*3/uL (ref 0.1–1.0)
Monocytes Relative: 13 %
Neutro Abs: 4.6 10*3/uL (ref 1.7–7.7)
Neutrophils Relative %: 66 %
Platelets: 115 10*3/uL — ABNORMAL LOW (ref 150–400)
RBC: 4.34 MIL/uL (ref 4.22–5.81)
RDW: 13.1 % (ref 11.5–15.5)
Smear Review: NORMAL
WBC: 6.9 10*3/uL (ref 4.0–10.5)
nRBC: 0 % (ref 0.0–0.2)

## 2021-10-28 LAB — URINALYSIS, ROUTINE W REFLEX MICROSCOPIC
Bilirubin Urine: NEGATIVE
Glucose, UA: NEGATIVE mg/dL
Hgb urine dipstick: NEGATIVE
Ketones, ur: NEGATIVE mg/dL
Leukocytes,Ua: NEGATIVE
Nitrite: NEGATIVE
Protein, ur: NEGATIVE mg/dL
Specific Gravity, Urine: 1.015 (ref 1.005–1.030)
pH: 7 (ref 5.0–8.0)

## 2021-10-28 LAB — COMPREHENSIVE METABOLIC PANEL
ALT: 17 U/L (ref 0–44)
AST: 44 U/L — ABNORMAL HIGH (ref 15–41)
Albumin: 3.9 g/dL (ref 3.5–5.0)
Alkaline Phosphatase: 79 U/L (ref 38–126)
Anion gap: 7 (ref 5–15)
BUN: 15 mg/dL (ref 8–23)
CO2: 31 mmol/L (ref 22–32)
Calcium: 9.1 mg/dL (ref 8.9–10.3)
Chloride: 94 mmol/L — ABNORMAL LOW (ref 98–111)
Creatinine, Ser: 1.47 mg/dL — ABNORMAL HIGH (ref 0.61–1.24)
GFR, Estimated: 44 mL/min — ABNORMAL LOW (ref >60)
Glucose, Bld: 129 mg/dL — ABNORMAL HIGH (ref 70–99)
Potassium: 3.9 mmol/L (ref 3.5–5.1)
Sodium: 132 mmol/L — ABNORMAL LOW (ref 135–145)
Total Bilirubin: 0.9 mg/dL (ref 0.3–1.2)
Total Protein: 7.5 g/dL (ref 6.5–8.1)

## 2021-10-28 LAB — RESP PANEL BY RT-PCR (FLU A&B, COVID) ARPGX2
Influenza A by PCR: NEGATIVE
Influenza B by PCR: NEGATIVE
SARS Coronavirus 2 by RT PCR: POSITIVE — AB

## 2021-10-28 LAB — TROPONIN I (HIGH SENSITIVITY)
Troponin I (High Sensitivity): 17 ng/L (ref <18)
Troponin I (High Sensitivity): 18 ng/L — ABNORMAL HIGH (ref <18)

## 2021-10-28 MED ORDER — MOLNUPIRAVIR EUA 200MG CAPSULE
4.0000 | ORAL_CAPSULE | Freq: Two times a day (BID) | ORAL | Status: DC
Start: 2021-10-28 — End: 2021-10-28
  Administered 2021-10-28: 800 mg via ORAL
  Filled 2021-10-28: qty 4

## 2021-10-28 MED ORDER — SODIUM CHLORIDE 0.9 % IV BOLUS
500.0000 mL | Freq: Once | INTRAVENOUS | Status: AC
  Administered 2021-10-28: 500 mL via INTRAVENOUS

## 2021-10-28 MED ORDER — ACETAMINOPHEN 500 MG PO TABS
1000.0000 mg | ORAL_TABLET | Freq: Once | ORAL | Status: AC
  Administered 2021-10-28: 1000 mg via ORAL
  Filled 2021-10-28: qty 2

## 2021-10-28 NOTE — Discharge Instructions (Addendum)
Patient is started on the molnupiravir.  We have given his first dose here and he can give the next dose tomorrow morning.  His chest x-ray did not show any evidence of pneumonia.  He was given some Tylenol and some fluids and was feeling better.  If he develops shortness of breath or any other concerns you can return to the ER for repeat evaluation

## 2021-10-28 NOTE — ED Notes (Signed)
Pt taken to CT.

## 2021-10-28 NOTE — ED Provider Triage Note (Signed)
Emergency Medicine Provider Triage Evaluation Note  Darryl Novak , a 88 y.o. male  was evaluated in triage.  Pt complains of weakness.  Per EMS maybe slightly worse left then right. LLK 9pm.  PT reports more generalized weakness.   Review of Systems  Positive: Weakness  Negative: Fever   Physical Exam  There were no vitals taken for this visit. Gen:   Awake, no distress  pt blind at baseline  Resp:  Normal effort  MSK:   Pt reports generalized weakness but maybe slightly more left then right. However pt does report prior storke affected the left as well.   Medical Decision Making  Medically screening exam initiated at 9:31 AM.  Appropriate orders placed.  Darryl Novak was informed that the remainder of the evaluation will be completed by another provider, this initial triage assessment does not replace that evaluation, and the importance of remaining in the ED until their evaluation is complete.  Labs, ct head, covid    Vanessa , MD 10/28/21 [phone]

## 2021-10-28 NOTE — ED Provider Notes (Signed)
[facility] Provider Note    Event Date/Time   First MD Initiated Contact with Patient 10/28/21 1142     (approximate)   History   Weakness   HPI  Darryl Novak is a 88 y.o. male with hypertension, prior stroke with left-sided deficits who comes in with concerns for generalized weakness.  Patient is coming from home place.  Patient woke up at 530 started to feel more weak and was unable to move around like his usual self.  When I asked patient in triage at 7 like it was more of just his arms felt weak.  Patient is blind at baseline.  Patient gets into the room the son is now at bedside who states that patient has never had a prior stroke that he is aware of.  Has had some TIAs in the past.  He reports that it was not one-sided weakness at all which is globally he could not get up as well with the walker.  He is from this facility that they are able to take care of COVID patients and patients with higher levels of care.   Physical Exam   Triage Vital Signs: ED Triage Vitals  Enc Vitals Group     BP 10/28/21 0943 (!) 133/52     Pulse Rate 10/28/21 0943 85     Resp 10/28/21 0943 16     Temp 10/28/21 0943 99.6 F (37.6 C)     Temp Source 10/28/21 0943 Oral     SpO2 10/28/21 0943 95 %     Weight 10/28/21 0944 163 lb (73.9 kg)     Height 10/28/21 0944 5\' 8"  (1.727 m)     Head Circumference --      Peak Flow --      Pain Score 10/28/21 0944 3     Pain Loc --      Pain Edu? --      Excl. in [place]? --     Most recent vital signs: Vitals:   10/28/21 0943  BP: (!) 133/52  Pulse: 85  Resp: 16  Temp: 99.6 F (37.6 C)  SpO2: 95%     General: Awake, no distress.  Pleasant male, blind CV:  Good peripheral perfusion.  Resp:  Normal effort.  Abd:  No distention.  Nontender Other:  Patient's eyes are closed due to him being blind.  Cranial nerves appear intact equal strength in arms and legs without any pronator drift or leg drift.   ED Results /  Procedures / Treatments   Labs (all labs ordered are listed, but only abnormal results are displayed) Labs Reviewed  RESP PANEL BY RT-PCR (FLU A&B, COVID) ARPGX2 - Abnormal; Notable for the following components:      Result Value   SARS Coronavirus 2 by RT PCR POSITIVE (*)    All other components within normal limits  CBC WITH DIFFERENTIAL/PLATELET - Abnormal; Notable for the following components:   Platelets 115 (*)    All other components within normal limits  COMPREHENSIVE METABOLIC PANEL - Abnormal; Notable for the following components:   Sodium 132 (*)    Chloride 94 (*)    Glucose, Bld 129 (*)    Creatinine, Ser 1.47 (*)    AST 44 (*)    GFR, Estimated 44 (*)    All other components within normal limits  URINALYSIS, ROUTINE W REFLEX MICROSCOPIC  TROPONIN I (HIGH SENSITIVITY)  TROPONIN I (HIGH SENSITIVITY)     EKG  My  interpretation of EKG:  Atrial fibrillation rate of 91 without any ST elevation, T wave version in lead III, normal intervals  RADIOLOGY I have reviewed the xray personally and new onset pneumonia.  Radiology read is hypoinflation of the lungs with minimal bibasilar subsegmental atelectasis  CT was reviewed personally by myself CT head without any evidence of intercranial hemorrhage.    PROCEDURES:  Critical Care performed: No  Procedures   MEDICATIONS ORDERED IN ED: Medications  molnupiravir EUA (LAGEVRIO) capsule 800 mg (has no administration in time range)  sodium chloride 0.9 % bolus 500 mL (0 mLs Intravenous Stopped 10/28/21 1219)  acetaminophen (TYLENOL) tablet 1,000 mg (1,000 mg Oral Given 10/28/21 1218)     IMPRESSION / MDM / ASSESSMENT AND PLAN / ED COURSE  I reviewed the triage vital signs and the nursing notes.  Patient with prior stroke comes in with some generalized weakness. Differential diagnosis includes, but is not limited to, anemia, dehydration, ACS, UTI, COVID, flu.  Patient seems to have a reassuring neurological exam.   He seems to be grossly weak and not focused on one side.  He does have a little bit more weakness on the left but he states that is baseline.  COVID test was positive. UA without evidence of UTI Troponin is around his baseline troponin denies any chest pain and unlikely to represent ACS given this weakness has been going on for some time and he is COVID-positive Trop repeat went to 18 from 17 but no evidence of ACS CMP is around his baseline creatinine at 1.4 and slightly low sodium and chloride.  I considered admission for COVID and weakness but patient is in a facility that can help take care of him and patient's son would like to try to been back to facility  Discussed with patient's son about doing a little bit of IV fluid and Tylenol to help him feel better than doing a ambulation trial.  Son would like him to build to go back to facility if able to.  Given patient's not hypoxic I think this could be possible.  We will discussed with pharmacy for Paxilovid  Patient is not a candidate for paxlovid given patient is on Eliquis.  Patient was given a mulnipravir.  Patient able to stand up and feels comfortable with being discharged back to facility  The patient is on the cardiac monitor to evaluate for evidence of arrhythmia and/or significant heart rate changes.   FINAL CLINICAL IMPRESSION(S) / ED DIAGNOSES   Final diagnoses:  COVID-19  Weakness     Rx / DC Orders   ED Discharge Orders     None        Note:  This document was prepared using Dragon voice recognition software and may include unintentional dictation errors.   Vanessa [place], MD 10/28/21 1309

## 2021-10-28 NOTE — ED Triage Notes (Signed)
Pt in via EMS from [facility] with c/o  Pt woke up at 0530 and stated he felt weak and was not able to move around like he usually does. Pt reports arms feel heavy. Pt with hx fo CVA and left side deficits. Pt able to stand and pivot. Pt is blind. 153/55, 96% RA, 80-90HR, pt with hx of a-fib, CBG 167, 98.6 temp

## 2021-10-28 NOTE — ED Notes (Signed)
Son remains at [facility]

## 2021-10-28 NOTE — ED Notes (Signed)
EDP into room, at [facility]. Son present.

## 2021-11-26 ENCOUNTER — Ambulatory Visit (INDEPENDENT_AMBULATORY_CARE_PROVIDER_SITE_OTHER): Payer: Medicare Other | Admitting: Dermatology

## 2021-11-26 ENCOUNTER — Telehealth: Payer: Self-pay

## 2021-11-26 ENCOUNTER — Other Ambulatory Visit: Payer: Self-pay

## 2021-11-26 DIAGNOSIS — L578 Other skin changes due to chronic exposure to nonionizing radiation: Secondary | ICD-10-CM | POA: Diagnosis not present

## 2021-11-26 DIAGNOSIS — L821 Other seborrheic keratosis: Secondary | ICD-10-CM

## 2021-11-26 DIAGNOSIS — Z85828 Personal history of other malignant neoplasm of skin: Secondary | ICD-10-CM | POA: Diagnosis not present

## 2021-11-26 DIAGNOSIS — L570 Actinic keratosis: Secondary | ICD-10-CM | POA: Diagnosis not present

## 2021-11-26 DIAGNOSIS — L820 Inflamed seborrheic keratosis: Secondary | ICD-10-CM | POA: Diagnosis not present

## 2021-11-26 MED ORDER — FLUOROURACIL 5 % EX CREA: TOPICAL_CREAM | Freq: Two times a day (BID) | CUTANEOUS | 1 refills | Status: DC

## 2021-11-26 NOTE — Progress Notes (Signed)
Follow-Up Visit   Subjective  Darryl Novak is a 88 y.o. male who presents for the following: Follow-up (Patient here today for 6 month follow up. Patient has a few places at face that itches. Patient has history of aks, isks, and history of scc at left forearm. ).  The following portions of the chart were reviewed this encounter and updated as appropriate:  Tobacco   Allergies   Meds   Problems   Med Hx   Surg Hx   Fam Hx      Review of Systems: No other skin or systemic complaints except as noted in HPI or Assessment and Plan.  Objective  Well appearing patient in no apparent distress; mood and affect are within normal limits.  A focused examination was performed including face, scalp, bilateral arms. Relevant physical exam findings are noted in the Assessment and Plan.  scalp x 6 , bilateral arms x 15 (21) Erythematous thin papules/macules with gritty scale.   bilateral arms x 2 (2) Erythematous stuck-on, waxy papule or plaque   Assessment & Plan  Actinic keratosis (21) scalp x 6 , bilateral arms x 15  Actinic keratoses are precancerous spots that appear secondary to cumulative UV radiation exposure/sun exposure over time. They are chronic with expected duration over 1 year. A portion of actinic keratoses will progress to squamous cell carcinoma of the skin. It is not possible to reliably predict which spots will progress to skin cancer and so treatment is recommended to prevent development of skin cancer.  Recommend daily broad spectrum sunscreen SPF 30+ to sun-exposed areas, reapply every 2 hours as needed.  Recommend staying in the shade or wearing long sleeves, sun glasses (UVA+UVB protection) and wide brim hats (4-inch brim around the entire circumference of the hat). Call for new or changing lesions.  Destruction of lesion - scalp x 6 , bilateral arms x 15 Complexity: simple   Destruction method: cryotherapy   Informed consent: discussed and consent obtained    Timeout:  patient name, date of birth, surgical site, and procedure verified Lesion destroyed using liquid nitrogen: Yes   Region frozen until ice ball extended beyond lesion: Yes   Cryotherapy cycles:  2 Outcome: patient tolerated procedure well with no complications   Post-procedure details: wound care instructions given   Additional details:  Prior to procedure, discussed risks of blister formation, small wound, skin dyspigmentation, or rare scar following cryotherapy. Recommend Vaseline ointment to treated areas while healing.  Related Medications fluorouracil (EFUDEX) 5 % cream Apply topically 2 (two) times daily. Apply 7 days to scalp  Inflamed seborrheic keratosis (2) bilateral arms x 2 Destruction of lesion - bilateral arms x 2 Complexity: simple   Destruction method: cryotherapy   Informed consent: discussed and consent obtained   Timeout:  patient name, date of birth, surgical site, and procedure verified Lesion destroyed using liquid nitrogen: Yes   Region frozen until ice ball extended beyond lesion: Yes   Outcome: patient tolerated procedure well with no complications   Post-procedure details: wound care instructions given   Additional details:  Prior to procedure, discussed risks of blister formation, small wound, skin dyspigmentation, or rare scar following cryotherapy. Recommend Vaseline ointment to treated areas while healing.  Seborrheic Keratoses - Stuck-on, waxy, tan-brown papules and/or plaques  - Benign-appearing - Discussed benign etiology and prognosis. - Observe - Call for any changes  Actinic Damage - Severe, confluent actinic changes with pre-cancerous actinic keratoses  - Severe, chronic, not at goal,  secondary to cumulative UV radiation exposure over time - diffuse scaly erythematous macules and papules with underlying dyspigmentation - Discussed Prescription "Field Treatment" for Severe, Chronic Confluent Actinic Changes with Pre-Cancerous Actinic  Keratoses Field treatment involves treatment of an entire area of skin that has confluent Actinic Changes (Sun/ Ultraviolet light damage) and PreCancerous Actinic Keratoses by method of PhotoDynamic Therapy (PDT) and/or prescription Topical Chemotherapy agents such as 5-fluorouracil, 5-fluorouracil/calcipotriene, and/or imiquimod.  The purpose is to decrease the number of clinically evident and subclinical PreCancerous lesions to prevent progression to development of skin cancer by chemically destroying early precancer changes that may or may not be visible.  It has been shown to reduce the risk of developing skin cancer in the treated area. As a result of treatment, redness, scaling, crusting, and open sores may occur during treatment course. One or more than one of these methods may be used and may have to be used several times to control, suppress and eliminate the PreCancerous changes. Discussed treatment course, expected reaction, and possible side effects. - Recommend daily broad spectrum sunscreen SPF 30+ to sun-exposed areas, reapply every 2 hours as needed.  - Staying in the shade or wearing long sleeves, sun glasses (UVA+UVB protection) and wide brim hats (4-inch brim around the entire circumference of the hat) are also recommended. - Call for new or changing lesions. Start 5-fluorouracil/calcipotriene cream twice a day for 7 days to affected areas including scalp. Prescription sent to [facility]. Patient provided with contact information for pharmacy and advised the pharmacy will mail the prescription to their home. Patient provided with handout reviewing treatment course and side effects and advised to call or message [place] on MyChart with any concerns.  History of Squamous Cell Carcinoma of the Skin - No evidence of recurrence today at left forearm 2022 treated with ed&c - No lymphadenopathy - Recommend regular full body skin exams - Recommend daily broad spectrum sunscreen SPF 30+ to  sun-exposed areas, reapply every 2 hours as needed.  - Call if any new or changing lesions are noted between office visits  Return for 6 - 8 month ak and isk , h/o scc. IRuthell Rummage, CMA, am acting as scribe for [name], MD. Documentation: I have reviewed the above documentation for accuracy and completeness, and I agree with the above.  [name], MD

## 2021-11-26 NOTE — Patient Instructions (Addendum)
Start 5-fluorouracil/calcipotriene cream twice a day for 7 days to affected areas including scalp . Prescription sent to [facility]. Patient provided with contact information for pharmacy and advised the pharmacy will mail the prescription to their home. Patient provided with handout reviewing treatment course and side effects and advised to call or message [place] on MyChart with any concerns.   5-Fluorouracil/Calcipotriene Patient Education   Actinic keratoses are the dry, red scaly spots on the skin caused by sun damage. A portion of these spots can turn into skin cancer with time, and treating them can help prevent development of skin cancer.   Treatment of these spots requires removal of the defective skin cells. There are various ways to remove actinic keratoses, including freezing with liquid nitrogen, treatment with creams, or treatment with a blue light procedure in the office.   5-fluorouracil cream is a topical cream used to treat actinic keratoses. It works by interfering with the growth of abnormal fast-growing skin cells, such as actinic keratoses. These cells peel off and are replaced by healthy ones.   5-fluorouracil/calcipotriene is a combination of the 5-fluorouracil cream with a vitamin D analog cream called calcipotriene. The calcipotriene alone does not treat actinic keratoses. However, when it is combined with 5-fluorouracil, it helps the 5-fluorouracil treat the actinic keratoses much faster so that the same results can be achieved with a much shorter treatment time.  INSTRUCTIONS FOR 5-FLUOROURACIL/CALCIPOTRIENE CREAM:   5-fluorouracil/calcipotriene cream typically only needs to be used for 4-7 days. A thin layer should be applied twice a day to the treatment areas recommended by your physician.   If your physician prescribed you separate tubes of 5-fluourouracil and calcipotriene, apply a thin layer of 5-fluorouracil followed by a thin layer of calcipotriene.   Avoid  contact with your eyes, nostrils, and mouth. Do not use 5-fluorouracil/calcipotriene cream on infected or open wounds.   You will develop redness, irritation and some crusting at areas where you have pre-cancer damage/actinic keratoses. IF YOU DEVELOP PAIN, BLEEDING, OR SIGNIFICANT CRUSTING, STOP THE TREATMENT EARLY - you have already gotten a good response and the actinic keratoses should clear up well.  Wash your hands after applying 5-fluorouracil 5% cream on your skin.   A moisturizer or sunscreen with a minimum SPF 30 should be applied each morning.   Once you have finished the treatment, you can apply a thin layer of Vaseline twice a day to irritated areas to soothe and calm the areas more quickly. If you experience significant discomfort, contact your physician.  For some patients it is necessary to repeat the treatment for best results.  SIDE EFFECTS: When using 5-fluorouracil/calcipotriene cream, you may have mild irritation, such as redness, dryness, swelling, or a mild burning sensation. This usually resolves within 2 weeks. The more actinic keratoses you have, the more redness and inflammation you can expect during treatment. Eye irritation has been reported rarely. If this occurs, please let us know.  If you have any trouble using this cream, please call the office. If you have any other questions about this information, please do not hesitate to ask me before you leave the office.     Actinic keratoses are precancerous spots that appear secondary to cumulative UV radiation exposure/sun exposure over time. They are chronic with expected duration over 1 year. A portion of actinic keratoses will progress to squamous cell carcinoma of the skin. It is not possible to reliably predict which spots will progress to skin cancer and so treatment is recommended  to prevent development of skin cancer.  Recommend daily broad spectrum sunscreen SPF 30+ to sun-exposed areas, reapply every 2 hours  as needed.  Recommend staying in the shade or wearing long sleeves, sun glasses (UVA+UVB protection) and wide brim hats (4-inch brim around the entire circumference of the hat). Call for new or changing lesions.   Cryotherapy Aftercare  Wash gently with soap and water everyday.   Apply Vaseline and Band-Aid daily until healed.      Seborrheic Keratosis  What causes seborrheic keratoses? Seborrheic keratoses are harmless, common skin growths that first appear during adult life.  As time goes by, more growths appear.  Some people may develop a large number of them.  Seborrheic keratoses appear on both covered and uncovered body parts.  They are not caused by sunlight.  The tendency to develop seborrheic keratoses can be inherited.  They vary in color from skin-colored to gray, brown, or even black.  They can be either smooth or have a rough, warty surface.   Seborrheic keratoses are superficial and look as if they were stuck on the skin.  Under the microscope this type of keratosis looks like layers upon layers of skin.  That is why at times the top layer may seem to fall off, but the rest of the growth remains and re-grows.    Treatment Seborrheic keratoses do not need to be treated, but can easily be removed in the office.  Seborrheic keratoses often cause symptoms when they rub on clothing or jewelry.  Lesions can be in the way of shaving.  If they become inflamed, they can cause itching, soreness, or burning.  Removal of a seborrheic keratosis can be accomplished by freezing, burning, or surgery. If any spot bleeds, scabs, or grows rapidly, please return to have it checked, as these can be an indication of a skin cancer.     If You Need Anything After Your Visit  If you have any questions or concerns for your doctor, please call our main line at [phone] and press option 4 to reach your doctor's medical assistant. If no one answers, please leave a voicemail as directed and we will  return your call as soon as possible. Messages left after 4 pm will be answered the following business day.   You may also send [place] a message via [place]. We typically respond to MyChart messages within 1-2 business days.  For prescription refills, please ask your pharmacy to contact our office. Our fax number is [phone].  If you have an urgent issue when the clinic is closed that cannot wait until the next business day, you can page your doctor at the number below.    Please note that while we do our best to be available for urgent issues outside of office hours, we are not available 24/7.   If you have an urgent issue and are unable to reach [place], you may choose to seek medical care at your doctor's office, retail clinic, urgent care center, or emergency room.  If you have a medical emergency, please immediately call 911 or go to the emergency department.  Pager Numbers  - Dr. Nehemiah Massed: [phone]  - Dr. Laurence Ferrari: [phone]  - Dr. Nicole Kindred: [phone]  In the event of inclement weather, please call our main line at [phone] for an update on the status of any delays or closures.  Dermatology Medication Tips: Please keep the boxes that topical medications come in in order to help keep track of the instructions  about where and how to use these. Pharmacies typically print the medication instructions only on the boxes and not directly on the medication tubes.   If your medication is too expensive, please contact our office at [phone] option 4 or send [place] a message through [place].   We are unable to tell what your co-pay for medications will be in advance as this is different depending on your insurance coverage. However, we may be able to find a substitute medication at lower cost or fill out paperwork to get insurance to cover a needed medication.   If a prior authorization is required to get your medication covered by your insurance company, please allow [place] 1-2 business  days to complete this process.  Drug prices often vary depending on where the prescription is filled and some pharmacies may offer cheaper prices.  The website www.goodrx.com contains coupons for medications through different pharmacies. The prices here do not account for what the cost may be with help from insurance (it may be cheaper with your insurance), but the website can give you the price if you did not use any insurance.  - You can print the associated coupon and take it with your prescription to the pharmacy.  - You may also stop by our office during regular business hours and pick up a GoodRx coupon card.  - If you need your prescription sent electronically to a different pharmacy, notify our office through [facility] or by phone at [phone] option 4.     Si Usted Necesita Algo Despus de Su Visita  Tambin puede enviarnos un mensaje a travs de Pharmacist, community. Por lo general respondemos a los mensajes de MyChart en el transcurso de 1 a 2 das hbiles.  Para renovar recetas, por favor pida a su farmacia que se ponga en contacto con nuestra oficina. Harland Dingwall de fax es [place] [phone].  Si tiene un asunto urgente cuando la clnica est cerrada y que no puede esperar hasta el siguiente da hbil, puede llamar/localizar a su doctor(a) al nmero que aparece a continuacin.   Por favor, tenga en cuenta que aunque hacemos todo lo posible para estar disponibles para asuntos urgentes fuera del horario de [place], no estamos disponibles las 24 horas del da, los 7 das de la [place].   Si tiene un problema urgente y no puede comunicarse con nosotros, puede optar por buscar atencin mdica  en el consultorio de su doctor(a), en una clnica privada, en un centro de atencin urgente o en una sala de emergencias.  Si tiene Engineering geologist, por favor llame inmediatamente al 911 o vaya a la sala de emergencias.  Nmeros de bper  - Dr. Nehemiah Massed: [phone]  - Dra. Moye:  [phone]  - Dra. Nicole Kindred: [phone]  En caso de inclemencias del [place], por favor llame a Johnsie Kindred principal al [phone] para una actualizacin sobre el [place] de cualquier retraso o cierre.  Consejos para la medicacin en dermatologa: Por favor, guarde las cajas en las que vienen los medicamentos de uso tpico para ayudarle a seguir las instrucciones sobre dnde y cmo usarlos. Las farmacias generalmente imprimen las instrucciones del medicamento slo en las cajas y no directamente en los tubos del [place].   Si su medicamento es muy caro, por favor, pngase en contacto con Zigmund Daniel llamando al [phone] y presione la opcin 4 o envenos un mensaje a travs de Pharmacist, community.   No podemos decirle cul ser su copago por los medicamentos por adelantado ya que  esto es diferente dependiendo de la cobertura de su seguro. Sin embargo, es posible que podamos encontrar un medicamento sustituto a Electrical engineer un formulario para que el seguro cubra el medicamento que se considera necesario.   Si se requiere una autorizacin previa para que su compaa de seguros [place] su medicamento, por favor permtanos de 1 a 2 das hbiles para completar este proceso.  Los precios de los medicamentos varan con frecuencia dependiendo del Environmental consultant de dnde se surte la receta y alguna farmacias pueden ofrecer precios ms baratos.  El sitio web www.goodrx.com tiene cupones para medicamentos de Airline pilot. Los precios aqu no tienen en cuenta lo que podra costar con la ayuda del seguro (puede ser ms barato con su seguro), pero el sitio web puede darle el precio si no utiliz Research scientist (physical sciences).  - Puede imprimir el cupn correspondiente y llevarlo con su receta a la farmacia.  - Tambin puede pasar por nuestra oficina durante el horario de atencin regular y Charity fundraiser una tarjeta de cupones de GoodRx.  - Si necesita que su receta se enve electrnicamente a una farmacia diferente,  informe a nuestra oficina a travs de MyChart de [place] o por telfono llamando al [phone] y presione la opcin 4.

## 2021-11-26 NOTE — Telephone Encounter (Signed)
Patient was in for follow up appointment with son today. Patient prescribed fluorouracil to use at scalp twice daily for 7 days. Contact information to pharmacy as well as instructions was given to patient before check out. Cream was sent to [facility].  Son informed staff patient gets medications dispensed at the [facility] and pharmacy needed to contact facility. Son provider the facility contact information.   Reached out to [place] to give contact information to facility. They will contact facility concerning cream.

## 2021-11-27 ENCOUNTER — Telehealth: Payer: Self-pay

## 2021-11-27 NOTE — Telephone Encounter (Signed)
Patient's son called concerning a phone call he reports the patient received yesterday from our office.  He reports patient was very upset and confused after phone call that was made. I assured patient we did not call patient regarding medication.   I explained that I had called and spoke with [place] yesterday regarding  fluorouracil cream. I gave them the contact information for [place] and also the son's contact info if they needed to reach anyone regarding medication instructions etc.   Patient son's verbalized understanding and denied further questions. He states he will call [place] and consult with this further.

## 2021-11-29 ENCOUNTER — Other Ambulatory Visit: Payer: Self-pay

## 2021-11-29 ENCOUNTER — Encounter: Payer: Self-pay | Admitting: Dermatology

## 2021-11-29 ENCOUNTER — Encounter: Payer: Self-pay | Admitting: Podiatry

## 2021-11-29 ENCOUNTER — Ambulatory Visit (INDEPENDENT_AMBULATORY_CARE_PROVIDER_SITE_OTHER): Payer: Medicare Other | Admitting: Podiatry

## 2021-11-29 DIAGNOSIS — M79674 Pain in right toe(s): Secondary | ICD-10-CM

## 2021-11-29 DIAGNOSIS — D689 Coagulation defect, unspecified: Secondary | ICD-10-CM

## 2021-11-29 DIAGNOSIS — B351 Tinea unguium: Secondary | ICD-10-CM

## 2021-11-29 DIAGNOSIS — N179 Acute kidney failure, unspecified: Secondary | ICD-10-CM

## 2021-11-29 DIAGNOSIS — M79675 Pain in left toe(s): Secondary | ICD-10-CM

## 2021-11-29 DIAGNOSIS — I739 Peripheral vascular disease, unspecified: Secondary | ICD-10-CM

## 2021-11-29 NOTE — Progress Notes (Signed)
This patient returns to my office for at risk foot care.  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  Patient is taking eliquiss.  This patient is unable to cut nails himself since the patient cannot reach his nails.These nails are painful walking and wearing shoes.  This patient presents for at risk foot care today. He presents to the office  with his son . ? ?General Appearance  Alert, conversant and in no acute stress. ? ?Vascular  Dorsalis pedis and posterior tibial  pulses are weakly  palpable  bilaterally.  Capillary return is within normal limits  bilaterally. Cold feet.  Absent hair. ? ?Neurologic  Senn-Weinstein monofilament wire test within normal limits  bilaterally. Muscle power within normal limits bilaterally. ? ?Nails Thick disfigured discolored nails with subungual debris  from hallux to fifth toes bilaterally. No evidence of bacterial infection or drainage bilaterally. ? ?Orthopedic  No limitations of motion  feet .  No crepitus or effusions noted.  No bony pathology or digital deformities noted. ? ?Skin  thin skin with no porokeratosis noted bilaterally.  No signs of infections or ulcers noted.    ? ?Onychomycosis  Pain in right toes  Pain in left toes ? ?Consent was obtained for treatment procedures.   Mechanical debridement of nails 1-5  bilaterally performed with a nail nipper.  Filed with dremel without incident.  ? ? ?Return office visit     12 weeks                 Told patient to return for periodic foot care and evaluation due to potential at risk complications. ? ? ?Gardiner Barefoot DPM  ?

## 2022-03-07 ENCOUNTER — Encounter: Payer: Self-pay | Admitting: Podiatry

## 2022-03-07 ENCOUNTER — Ambulatory Visit (INDEPENDENT_AMBULATORY_CARE_PROVIDER_SITE_OTHER): Payer: Medicare Other | Admitting: Podiatry

## 2022-03-07 DIAGNOSIS — D689 Coagulation defect, unspecified: Secondary | ICD-10-CM

## 2022-03-07 DIAGNOSIS — B351 Tinea unguium: Secondary | ICD-10-CM | POA: Diagnosis not present

## 2022-03-07 DIAGNOSIS — M79675 Pain in left toe(s): Secondary | ICD-10-CM

## 2022-03-07 DIAGNOSIS — I739 Peripheral vascular disease, unspecified: Secondary | ICD-10-CM

## 2022-03-07 DIAGNOSIS — M79674 Pain in right toe(s): Secondary | ICD-10-CM

## 2022-03-07 DIAGNOSIS — N179 Acute kidney failure, unspecified: Secondary | ICD-10-CM

## 2022-03-07 NOTE — Progress Notes (Signed)
This patient returns to my office for at risk foot care.  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  Patient is taking eliquiss.  This patient is unable to cut nails himself since the patient cannot reach his nails.These nails are painful walking and wearing shoes.  This patient presents for at risk foot care today. He presents to the office  with his son .  General Appearance  Alert, conversant and in no acute stress.  Vascular  Dorsalis pedis and posterior tibial  pulses are weakly  palpable  bilaterally.  Capillary return is within normal limits  bilaterally. Cold feet.  Absent hair.  Neurologic  Senn-Weinstein monofilament wire test within normal limits  bilaterally. Muscle power within normal limits bilaterally.  Nails Thick disfigured discolored nails with subungual debris  from hallux to fifth toes bilaterally. No evidence of bacterial infection or drainage bilaterally.  Orthopedic  No limitations of motion  feet .  No crepitus or effusions noted.  No bony pathology or digital deformities noted.  Skin  thin skin with no porokeratosis noted bilaterally.  No signs of infections or ulcers noted.     Onychomycosis  Pain in right toes  Pain in left toes  Consent was obtained for treatment procedures.   Mechanical debridement of nails 1-5  bilaterally performed with a nail nipper.  Filed with dremel without incident.    Return office visit     12 weeks                 Told patient to return for periodic foot care and evaluation due to potential at risk complications.   Gardiner Barefoot DPM

## 2022-05-27 ENCOUNTER — Emergency Department: Payer: Medicare Other

## 2022-05-27 ENCOUNTER — Encounter: Payer: Self-pay | Admitting: Emergency Medicine

## 2022-05-27 DIAGNOSIS — W01198A Fall on same level from slipping, tripping and stumbling with subsequent striking against other object, initial encounter: Secondary | ICD-10-CM | POA: Insufficient documentation

## 2022-05-27 DIAGNOSIS — I4891 Unspecified atrial fibrillation: Secondary | ICD-10-CM | POA: Insufficient documentation

## 2022-05-27 DIAGNOSIS — Z7901 Long term (current) use of anticoagulants: Secondary | ICD-10-CM | POA: Insufficient documentation

## 2022-05-27 DIAGNOSIS — I2510 Atherosclerotic heart disease of native coronary artery without angina pectoris: Secondary | ICD-10-CM | POA: Insufficient documentation

## 2022-05-27 DIAGNOSIS — S0101XA Laceration without foreign body of scalp, initial encounter: Secondary | ICD-10-CM | POA: Diagnosis not present

## 2022-05-27 DIAGNOSIS — S0990XA Unspecified injury of head, initial encounter: Secondary | ICD-10-CM | POA: Diagnosis present

## 2022-05-27 NOTE — ED Provider Triage Note (Signed)
Emergency Medicine Provider Triage Evaluation Note  Darryl Novak, a 88 y.o. male  was evaluated in triage.  Pt complains of chemical fall with head injury.  Denies any LOC but presents with laceration to his scalp.  Review of Systems  Positive: Head injury, scalp lac Negative: LOC  Physical Exam  There were no vitals taken for this visit. Gen:   Awake, no distress  NAD Resp:  Normal effort  MSK:   Moves extremities without difficulty  Other:    Medical Decision Making  Medically screening exam initiated at 7:08 PM.  Appropriate orders placed.  Darryl Novak was informed that the remainder of the evaluation will be completed by another provider, this initial triage assessment does not replace that evaluation, and the importance of remaining in the ED until their evaluation is complete.  Geriatric patient with ED evaluation of a mechanical fall.   Melvenia Needles, PA-C 05/27/22 1948

## 2022-05-27 NOTE — ED Triage Notes (Signed)
EMS brings pt in from [facility] for fall getting up to BR; hit nightstand; denies LOC; lac to scalp

## 2022-05-27 NOTE — ED Triage Notes (Signed)
Pt presents via POV with complaints of laceration to his scalp following a fall while getting up to go to the restroom. He notes hitting his head on the nightstand beside his bed after getting caught in his pants. Pt on Eliquis, no LOC.  Denies dizziness, CP or SOB.

## 2022-05-28 ENCOUNTER — Emergency Department
Admission: EM | Admit: 2022-05-28 | Discharge: 2022-05-28 | Disposition: A | Discharge status: Other Acute Inpatient Hospital | Payer: Medicare Other | Attending: Emergency Medicine | Admitting: Emergency Medicine

## 2022-05-28 DIAGNOSIS — S0101XA Laceration without foreign body of scalp, initial encounter: Secondary | ICD-10-CM

## 2022-05-28 DIAGNOSIS — W19XXXA Unspecified fall, initial encounter: Secondary | ICD-10-CM

## 2022-05-28 DIAGNOSIS — Z7901 Long term (current) use of anticoagulants: Secondary | ICD-10-CM

## 2022-05-28 MED ORDER — BACITRACIN ZINC 500 UNIT/GM EX OINT
TOPICAL_OINTMENT | Freq: Once | CUTANEOUS | Status: AC
  Filled 2022-05-28: qty 0.9

## 2022-05-28 NOTE — Discharge Instructions (Addendum)
Use Tylenol for pain and fevers.  Up to 1000 mg per dose, up to 4 times per day.  Do not take more than 4000 mg of Tylenol/acetaminophen within 24 hours..  Gently wash the wound with soap and water.  It is okay to shower, but do not submerge in a bath or go swimming as it is healing.  Do not vigorously scrub.   Gently pat dry.   Once dry, then apply Neosporin or bacitracin or even Vaseline ointment to the area to act as a barrier to help prevent infection.  The staples will need to be removed after 7-10 days.

## 2022-05-28 NOTE — ED Notes (Signed)
Pts POA/[name] present with pt and transporting pt back to [place]. Report provided to Estill Bamberg, [employer] at facility on pts return by family as well as follow up care with staple removal and wound care.

## 2022-05-28 NOTE — ED Provider Notes (Signed)
[facility] Provider Note    Event Date/Time   First MD Initiated Contact with Patient 05/28/22 0107     (approximate)   History   Fall   HPI  Darryl Novak is a 88 y.o. male who presents to the ED for evaluation of Fall   I reviewed cardiology clinic visit from April.  History of A-fib, PAD, CAD.  Anticoagulated on Eliquis.  Patient presents to the ED from a local SNF, accompanied by his son, for evaluation of an accidental fall and head injury.  Patient reports he was leaning forward to pull his pants down to sit on the toilet when he "leaned too far."  He reports falling forward and striking his forehead on the ground.  No syncope or other injuries.  No recent illnesses.  No other concerns.  Physical Exam   Triage Vital Signs: ED Triage Vitals  Enc Vitals Group     BP 05/27/22 2001 109/70     Pulse Rate 05/27/22 2001 69     Resp 05/27/22 2001 18     Temp 05/27/22 2001 97.6 F (36.4 C)     Temp Source 05/27/22 2001 Oral     SpO2 05/27/22 2001 95 %     Weight 05/27/22 2002 163 lb 2.3 oz (74 kg)     Height 05/27/22 2002 '5\' 8"'$  (1.727 m)     Head Circumference --      Peak Flow --      Pain Score 05/27/22 2002 4     Pain Loc --      Pain Edu? --      Excl. in [place]? --     Most recent vital signs: Vitals:   05/27/22 2001 05/28/22 0225  BP: 109/70 112/66  Pulse: 69 67  Resp: 18 16  Temp: 97.6 F (36.4 C) 97.9 F (36.6 C)  SpO2: 95% 96%    General: Awake, no distress.  CV:  Good peripheral perfusion.  Resp:  Normal effort.  Abd:  No distention.  MSK:  Obliquely oriented laceration to the frontal scalp that is hemostatic with direct pressure, surrounding 3 cm hematoma.  Laceration is about 3 cm in length.  No active/pulsating arterial bleeding. Neuro:  No focal deficits appreciated. Cranial nerves II through XII intact 5/5 strength and sensation in all 4 extremities Other:     ED Results / Procedures / Treatments   Labs (all  labs ordered are listed, but only abnormal results are displayed) Labs Reviewed - No data to display  EKG   RADIOLOGY CT head interpreted by me without evidence of acute intracranial pathology CT cervical spine interpreted by me without evidence of fracture or dislocation.  Official radiology report(s): CT Cervical Spine Wo Contrast  Result Date: 05/27/2022 CLINICAL DATA:  Fall, head nightstand, denies loss of consciousness, scalp laceration EXAM: CT CERVICAL SPINE WITHOUT CONTRAST TECHNIQUE: Multidetector CT imaging of the cervical spine was performed without intravenous contrast. Multiplanar CT image reconstructions were also generated. RADIATION DOSE REDUCTION: This exam was performed according to the departmental dose-optimization program which includes automated exposure control, adjustment of the mA and/or kV according to patient size and/or use of iterative reconstruction technique. COMPARISON:  None Available. FINDINGS: Alignment: Minimal anterolisthesis at C2-C3 and C7-T1. Remaining alignments normal. Skull base and vertebrae: Osseous demineralization. Multilevel disc space narrowing and endplate spur formation. Multilevel facet degenerative changes. Visualized skull base intact. Slight superior endplate concavity at T2 without definite acute fracture or paraspinal soft tissue  swelling, likely old. Soft tissues and spinal canal: Prevertebral soft tissues normal thickness. Atherosclerotic calcification of internal carotid and vertebral arteries at skull base as well as carotid bifurcations, proximal great vessels and aortic arch. Remaining cervical soft tissues unremarkable. Disc levels:  No specific abnormalities Upper chest: Peripheral interstitial changes and minimal pleural thickening/calcification. Peribronchial thickening. Other: N/A IMPRESSION: Multilevel degenerative disc and facet disease changes of the cervical spine. Slight superior endplate concavity at T2 without definite acute  fracture or paraspinal soft tissue swelling, appears old. Extensive atherosclerotic calcifications. Aortic Atherosclerosis (ICD10-I70.0) Electronically Signed   By: Lavonia Dana M.D.   On: 05/27/2022 19:40   CT Head Wo Contrast  Result Date: 05/27/2022 CLINICAL DATA:  Trauma, fall EXAM: CT HEAD WITHOUT CONTRAST TECHNIQUE: Contiguous axial images were obtained from the base of the skull through the vertex without intravenous contrast. RADIATION DOSE REDUCTION: This exam was performed according to the departmental dose-optimization program which includes automated exposure control, adjustment of the mA and/or kV according to patient size and/or use of iterative reconstruction technique. COMPARISON:  10/28/2021 FINDINGS: Brain: No acute intracranial findings are seen. There are no signs of bleeding within the cranium. Cortical sulci are prominent. There is decreased density in periventricular and subcortical white matter. Vascular: Scattered arterial calcifications are seen. Skull: No fracture is seen in calvarium. Sinuses/Orbits: Unremarkable. Other: None. IMPRESSION: No acute intracranial findings are seen. Atrophy. Small-vessel disease. Electronically Signed   By: Elmer Picker M.D.   On: 05/27/2022 19:36    PROCEDURES and INTERVENTIONS:  .Marland KitchenLaceration Repair  Date/Time: 05/28/2022 5:19 AM  Performed by: Vladimir Crofts, MD Authorized by: Vladimir Crofts, MD   Consent:    Consent obtained:  Verbal   Consent given by:  Patient and healthcare agent   Risks, benefits, and alternatives were discussed: yes   Laceration details:    Location:  Scalp   Scalp location:  Frontal   Length (cm):  3 Exploration:    Hemostasis achieved with:  Direct pressure   Imaging obtained comment:  CT   Imaging outcome: foreign body not noted     Contaminated: no   Treatment:    Area cleansed with:  Povidone-iodine   Amount of cleaning:  Standard   Irrigation solution:  Sterile saline Skin repair:    Repair  method:  Staples   Number of staples:  4 Approximation:    Approximation:  Close Repair type:    Repair type:  Simple Post-procedure details:    Dressing:  Antibiotic ointment   Procedure completion:  Tolerated well, no immediate complications   Medications  bacitracin ointment ( Topical Given 05/28/22 0224)     IMPRESSION / MDM / ASSESSMENT AND PLAN / ED COURSE  I reviewed the triage vital signs and the nursing notes.  Differential diagnosis includes, but is not limited to, laceration, skull fracture, cranial hemorrhage, retrobulbar hematoma  {Patient presents with symptoms of an acute illness or injury that is potentially life-threatening.  Anticoagulated 88 year old male presents to the ED after an accidental mechanical fall with an associated scalp laceration suitable for bedside repair and return to facility.  He looks systemically well without evidence of neurologic or vascular deficits.  No signs of EOM entrapment.  Has a frontal scalp laceration with a small associated hematoma.  I cleaned up the area and apply for staples with good closure and hemostasis.  We discussed wound care, return precautions and staple removal.  Clinical Course as of 05/28/22 0520  Tue May 28, 2022  2256 4 staples to the scalp [DS]    Clinical Course User Index [DS] Vladimir Crofts, MD     FINAL CLINICAL IMPRESSION(S) / ED DIAGNOSES   Final diagnoses:  Fall, initial encounter  Scalp laceration, initial encounter  Anticoagulated     Rx / DC Orders   ED Discharge Orders     None        Note:  This document was prepared using Dragon voice recognition software and may include unintentional dictation errors.   Vladimir Crofts, MD 05/28/22 [phone]

## 2022-06-20 ENCOUNTER — Emergency Department
Admission: EM | Admit: 2022-06-20 | Discharge: 2022-06-20 | Disposition: A | Discharge status: Home / Self Care | Payer: Medicare Other | Attending: Emergency Medicine | Admitting: Emergency Medicine

## 2022-06-20 ENCOUNTER — Other Ambulatory Visit: Payer: Self-pay

## 2022-06-20 DIAGNOSIS — I4891 Unspecified atrial fibrillation: Secondary | ICD-10-CM | POA: Insufficient documentation

## 2022-06-20 DIAGNOSIS — I2510 Atherosclerotic heart disease of native coronary artery without angina pectoris: Secondary | ICD-10-CM | POA: Insufficient documentation

## 2022-06-20 DIAGNOSIS — R040 Epistaxis: Secondary | ICD-10-CM | POA: Diagnosis present

## 2022-06-20 DIAGNOSIS — Z8673 Personal history of transient ischemic attack (TIA), and cerebral infarction without residual deficits: Secondary | ICD-10-CM | POA: Diagnosis not present

## 2022-06-20 DIAGNOSIS — Z7901 Long term (current) use of anticoagulants: Secondary | ICD-10-CM | POA: Insufficient documentation

## 2022-06-20 MED ORDER — OXYMETAZOLINE HCL 0.05 % NA SOLN
1.0000 | Freq: Once | NASAL | Status: AC
  Administered 2022-06-20: 1 via NASAL
  Filled 2022-06-20: qty 30

## 2022-06-20 NOTE — ED Notes (Signed)
78 yom with a c/c of nosebleed.

## 2022-06-20 NOTE — ED Triage Notes (Signed)
Pt from [facility], pt on Eliquis and has had a nosebleed x 20 minutes at SNF. Pt has had minimal bleeding since EMS picked up pt and no bleeding noted in triage. Per EMS they noted several tissues with blood at the SNF and some blood in toilet. Pt denies falling.

## 2022-06-20 NOTE — ED Provider Notes (Signed)
[facility] Provider Note    Event Date/Time   First MD Initiated Contact with Patient 06/20/22 1322     (approximate)   History   Chief Complaint Epistaxis   HPI  Darryl Novak is a 88 y.o. male with past medical history of hyperlipidemia, stroke, CAD, atrial fibrillation on Eliquis, and PAD who presents to the ED complaining of epistaxis.  Patient reports that about an hour prior to arrival he developed bleeding from the left side of his nose.  He is not sure exactly how much he bled as he is blind, but reports that it seemed to fill up multiple tissues.  He denies any recent trauma to his nose.  EMS was called to his nursing facility, however bleeding seems to have stopped by the time EMS arrived.  Patient now states that he feels well with no complaints.     Physical Exam   Triage Vital Signs: ED Triage Vitals [06/20/22 1244]  Enc Vitals Group     BP 134/77     Pulse Rate 68     Resp 16     Temp 97.7 F (36.5 C)     Temp Source Oral     SpO2 95 %     Weight 160 lb (72.6 kg)     Height '5\' 8"'$  (1.727 m)     Head Circumference      Peak Flow      Pain Score 0     Pain Loc      Pain Edu?      Excl. in [place]?     Most recent vital signs: Vitals:   06/20/22 1244  BP: 134/77  Pulse: 68  Resp: 16  Temp: 97.7 F (36.5 C)  SpO2: 95%    Constitutional: Alert and oriented. Eyes: Conjunctivae are normal. Head: Atraumatic. Nose: Dried blood in the left nare. Mouth/Throat: Mucous membranes are moist.  Cardiovascular: Normal rate, regular rhythm. Grossly normal heart sounds.  2+ radial pulses bilaterally. Respiratory: Normal respiratory effort.  No retractions. Lungs CTAB. Gastrointestinal: Soft and nontender. No distention. Musculoskeletal: No lower extremity tenderness nor edema.  Neurologic:  Normal speech and language. No gross focal neurologic deficits are appreciated.    ED Results / Procedures / Treatments   Labs (all labs  ordered are listed, but only abnormal results are displayed) Labs Reviewed - No data to display   PROCEDURES:  Critical Care performed: No  Procedures   MEDICATIONS ORDERED IN ED: Medications  oxymetazoline (AFRIN) 0.05 % nasal spray 1 spray (1 spray Each Nare Given by Other 06/20/22 1331)     IMPRESSION / MDM / [place] / ED COURSE  I reviewed the triage vital signs and the nursing notes.                              88 y.o. male with past medical history of hyperlipidemia, stroke, atrial fibrillation on Eliquis, CAD, and PAD who presents to the ED complaining of nosebleed starting about 1 hour prior to arrival that seems to have resolved by the time of arrival into the ED.  Patient's presentation is most consistent with acute, uncomplicated illness.  Differential diagnosis includes, but is not limited to, anterior epistaxis, posterior epistaxis, anemia.  Patient nontoxic-appearing and in no acute distress, vital signs are unremarkable.  He has dried blood in his left nare but no active bleeding noted.  He states he  feels well with no complaints, no reason to suspect significant anemia at this time.  Afrin was applied to both naris and patient is appropriate for discharge home with PCP follow-up, will be provided Afrin to use in the incidence of recurrent bleeding.  Daughter counseled to have patient return to the ED for new or worsening symptoms, daughter agrees with plan.      FINAL CLINICAL IMPRESSION(S) / ED DIAGNOSES   Final diagnoses:  Epistaxis     Rx / DC Orders   ED Discharge Orders     None        Note:  This document was prepared using Dragon voice recognition software and may include unintentional dictation errors.   Blake Divine, MD 06/20/22 [phone]

## 2022-06-20 NOTE — Discharge Instructions (Addendum)
If you develop bleeding again, you should do a spray of Afrin in each nostril, then hold pressure on the soft part of your nose for 10 minutes.  This should help stop the bleeding, but if you continue to have bleeding despite this, please return to the ER for reevaluation.

## 2022-07-04 ENCOUNTER — Encounter: Payer: Self-pay | Admitting: Podiatry

## 2022-07-04 ENCOUNTER — Ambulatory Visit (INDEPENDENT_AMBULATORY_CARE_PROVIDER_SITE_OTHER): Payer: Medicare Other | Admitting: Podiatry

## 2022-07-04 DIAGNOSIS — M79675 Pain in left toe(s): Secondary | ICD-10-CM | POA: Diagnosis not present

## 2022-07-04 DIAGNOSIS — B351 Tinea unguium: Secondary | ICD-10-CM

## 2022-07-04 DIAGNOSIS — D689 Coagulation defect, unspecified: Secondary | ICD-10-CM | POA: Diagnosis not present

## 2022-07-04 DIAGNOSIS — M79674 Pain in right toe(s): Secondary | ICD-10-CM

## 2022-07-04 DIAGNOSIS — N179 Acute kidney failure, unspecified: Secondary | ICD-10-CM

## 2022-07-04 DIAGNOSIS — I739 Peripheral vascular disease, unspecified: Secondary | ICD-10-CM

## 2022-07-04 NOTE — Progress Notes (Signed)
This patient returns to my office for at risk foot care.  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  Patient is taking eliquiss.  This patient is unable to cut nails himself since the patient cannot reach his nails.These nails are painful walking and wearing shoes.  This patient presents for at risk foot care today. He presents to the office  with his son .  General Appearance  Alert, conversant and in no acute stress.  Vascular  Dorsalis pedis and posterior tibial  pulses are weakly  palpable  bilaterally.  Capillary return is within normal limits  bilaterally. Cold feet.  Absent hair.  Neurologic  Senn-Weinstein monofilament wire test within normal limits  bilaterally. Muscle power within normal limits bilaterally.  Nails Thick disfigured discolored nails with subungual debris  from hallux to fifth toes bilaterally. No evidence of bacterial infection or drainage bilaterally.  Orthopedic  No limitations of motion  feet .  No crepitus or effusions noted.  No bony pathology or digital deformities noted.  Skin  thin skin with no porokeratosis noted bilaterally.  No signs of infections or ulcers noted.     Onychomycosis  Pain in right toes  Pain in left toes  Consent was obtained for treatment procedures.   Mechanical debridement of nails 1-5  bilaterally performed with a nail nipper.  Filed with dremel without incident. Cauterized fourth toe right foot.   Return office visit     12 weeks                 Told patient to return for periodic foot care and evaluation due to potential at risk complications.   Ormond Lazo DPM  

## 2022-07-10 ENCOUNTER — Emergency Department
Admission: EM | Admit: 2022-07-10 | Discharge: 2022-07-10 | Disposition: A | Discharge status: Skilled Nursing Facility | Payer: Medicare Other | Attending: Emergency Medicine | Admitting: Emergency Medicine

## 2022-07-10 ENCOUNTER — Other Ambulatory Visit: Payer: Self-pay

## 2022-07-10 ENCOUNTER — Emergency Department: Payer: Medicare Other

## 2022-07-10 DIAGNOSIS — W1811XA Fall from or off toilet without subsequent striking against object, initial encounter: Secondary | ICD-10-CM | POA: Insufficient documentation

## 2022-07-10 DIAGNOSIS — I6782 Cerebral ischemia: Secondary | ICD-10-CM | POA: Insufficient documentation

## 2022-07-10 DIAGNOSIS — Y92002 Bathroom of unspecified non-institutional (private) residence single-family (private) house as the place of occurrence of the external cause: Secondary | ICD-10-CM | POA: Insufficient documentation

## 2022-07-10 DIAGNOSIS — Z7901 Long term (current) use of anticoagulants: Secondary | ICD-10-CM | POA: Insufficient documentation

## 2022-07-10 DIAGNOSIS — I10 Essential (primary) hypertension: Secondary | ICD-10-CM | POA: Insufficient documentation

## 2022-07-10 DIAGNOSIS — Z043 Encounter for examination and observation following other accident: Secondary | ICD-10-CM | POA: Insufficient documentation

## 2022-07-10 DIAGNOSIS — W19XXXA Unspecified fall, initial encounter: Secondary | ICD-10-CM

## 2022-07-10 LAB — CBC WITH DIFFERENTIAL/PLATELET
Abs Immature Granulocytes: 0.02 10*3/uL (ref 0.00–0.07)
Basophils Absolute: 0.1 10*3/uL (ref 0.0–0.1)
Basophils Relative: 1 %
Eosinophils Absolute: 0.4 10*3/uL (ref 0.0–0.5)
Eosinophils Relative: 5 %
HCT: 46 % (ref 39.0–52.0)
Hemoglobin: 15.2 g/dL (ref 13.0–17.0)
Immature Granulocytes: 0 %
Lymphocytes Relative: 28 %
Lymphs Abs: 2.2 10*3/uL (ref 0.7–4.0)
MCH: 32.5 pg (ref 26.0–34.0)
MCHC: 33 g/dL (ref 30.0–36.0)
MCV: 98.3 fL (ref 80.0–100.0)
Monocytes Absolute: 0.8 10*3/uL (ref 0.1–1.0)
Monocytes Relative: 10 %
Neutro Abs: 4.5 10*3/uL (ref 1.7–7.7)
Neutrophils Relative %: 56 %
Platelets: 113 10*3/uL — ABNORMAL LOW (ref 150–400)
RBC: 4.68 MIL/uL (ref 4.22–5.81)
RDW: 12.5 % (ref 11.5–15.5)
WBC: 8 10*3/uL (ref 4.0–10.5)
nRBC: 0 % (ref 0.0–0.2)

## 2022-07-10 LAB — URINALYSIS, ROUTINE W REFLEX MICROSCOPIC
Bilirubin Urine: NEGATIVE
Glucose, UA: NEGATIVE mg/dL
Hgb urine dipstick: NEGATIVE
Ketones, ur: NEGATIVE mg/dL
Leukocytes,Ua: NEGATIVE
Nitrite: NEGATIVE
Protein, ur: NEGATIVE mg/dL
Specific Gravity, Urine: 1.009 (ref 1.005–1.030)
pH: 6 (ref 5.0–8.0)

## 2022-07-10 LAB — BASIC METABOLIC PANEL
Anion gap: 7 (ref 5–15)
BUN: 16 mg/dL (ref 8–23)
CO2: 28 mmol/L (ref 22–32)
Calcium: 9.2 mg/dL (ref 8.9–10.3)
Chloride: 98 mmol/L (ref 98–111)
Creatinine, Ser: 1.36 mg/dL — ABNORMAL HIGH (ref 0.61–1.24)
GFR, Estimated: 48 mL/min — ABNORMAL LOW (ref >60)
Glucose, Bld: 100 mg/dL — ABNORMAL HIGH (ref 70–99)
Potassium: 3.6 mmol/L (ref 3.5–5.1)
Sodium: 133 mmol/L — ABNORMAL LOW (ref 135–145)

## 2022-07-10 NOTE — ED Notes (Signed)
Called [facility] for transport to [facility]  [phone]

## 2022-07-10 NOTE — ED Provider Notes (Signed)
[facility] Provider Note  Patient Contact: 3:36 PM (approximate)   History   Fall   HPI  Darryl Novak is a 88 y.o. male with a history of CVA, hyperlipidemia, hypertension, peripheral vascular disease, atrial fibrillation and blindness, presents to the emergency department after patient states that he lost his balance and went from standing to sitting upright.  He denies hitting his head or neck and denies any current pain.  In particular, patient denies chest pain, back pain or abdominal pain.  No fever or chills at home.      Physical Exam   Triage Vital Signs: ED Triage Vitals  Enc Vitals Group     BP 07/10/22 1348 (!) 158/90     Pulse Rate 07/10/22 1342 83     Resp --      Temp 07/10/22 1342 97.8 F (36.6 C)     Temp Source 07/10/22 1342 Oral     SpO2 07/10/22 1342 93 %     Weight 07/10/22 1341 160 lb (72.6 kg)     Height --      Head Circumference --      Peak Flow --      Pain Score 07/10/22 1341 0     Pain Loc --      Pain Edu? --      Excl. in [place]? --     Most recent vital signs: Vitals:   07/10/22 1348 07/10/22 1615  BP: (!) 158/90 (!) 153/67  Pulse: 80 86  Resp:  20  Temp:  98.7 F (37.1 C)  SpO2:  96%     General: Alert and in no acute distress. Eyes:  PERRL. EOMI. Head: No acute traumatic findings ENT:      Nose: No congestion/rhinnorhea.      Mouth/Throat: Mucous membranes are moist. Neck: No stridor. No cervical spine tenderness to palpation. Cardiovascular:  Good peripheral perfusion Respiratory: Normal respiratory effort without tachypnea or retractions. Lungs CTAB. Good air entry to the bases with no decreased or absent breath sounds. Gastrointestinal: Bowel sounds 4 quadrants. Soft and nontender to palpation. No guarding or rigidity. No palpable masses. No distention. No CVA tenderness. Musculoskeletal: Full range of motion to all extremities.  Neurologic:  No gross focal neurologic deficits are  appreciated.  Skin:   No rash noted Other:   ED Results / Procedures / Treatments   Labs (all labs ordered are listed, but only abnormal results are displayed) Labs Reviewed  CBC WITH DIFFERENTIAL/PLATELET - Abnormal; Notable for the following components:      Result Value   Platelets 113 (*)    All other components within normal limits  BASIC METABOLIC PANEL - Abnormal; Notable for the following components:   Sodium 133 (*)    Glucose, Bld 100 (*)    Creatinine, Ser 1.36 (*)    GFR, Estimated 48 (*)    All other components within normal limits  URINALYSIS, ROUTINE W REFLEX MICROSCOPIC - Abnormal; Notable for the following components:   Color, Urine YELLOW (*)    APPearance CLEAR (*)    All other components within normal limits        RADIOLOGY  I personally viewed and evaluated these images as part of my medical decision making, as well as reviewing the written report by the radiologist.  ED Provider Interpretation: No evidence of intracranial bleed on head CT or skull fracture.  No evidence of C-spine fracture.  No evidence of pneumothorax or rib fracture  on chest x-ray.   PROCEDURES:  Critical Care performed: No  Procedures   MEDICATIONS ORDERED IN ED: Medications - No data to display   IMPRESSION / MDM / [place] / ED COURSE  I reviewed the triage vital signs and the nursing notes.                              Assessment and plan Fall 88 year old male with past medical history detailed above, presents to the emergency department after patient states that he went to change positions and went from a standing to a sitting position after slightly losing his balance.  Patient was mildly hypertensive at triage but vital signs were otherwise reassuring.  On exam, patient was alert and active.  He followed commands well and was able to provide historical details.  We will obtain CT head and cervical spine as patient is currently anticoagulated with  Eliquis for A-fib and obtain chest x-ray and will reassess.  CTs of the head and cervical spine were unremarkable for intracranial bleed, skull fracture or C-spine fracture.  Chest x-ray showed no evidence of pneumothorax or rib fracture.  I did reach out to patient's daughter, Darryl Novak to for a care update.  Recommended Tylenol as needed for discomfort.  Return precautions were given to return with new or worsening symptoms.     FINAL CLINICAL IMPRESSION(S) / ED DIAGNOSES   Final diagnoses:  Fall, initial encounter     Rx / DC Orders   ED Discharge Orders     None        Note:  This document was prepared using Dragon voice recognition software and may include unintentional dictation errors.   Vallarie Mare Pollock, PA-C 07/10/22 1629    Blake Divine, MD 07/10/22 2259

## 2022-07-10 NOTE — ED Notes (Addendum)
Pt's son came and picked pt up to transport to [facility].

## 2022-07-10 NOTE — ED Triage Notes (Signed)
Pt BIB EMS for a fall. Per pt, he was trying to sit on the toilet and missed the toilet and sat in the floor. Pt denies hitting his head or LOC. Per EMS, pt could possibly have a UTI.

## 2022-07-10 NOTE — ED Notes (Signed)
Spoke with Addie from [facility] regarding pt discharge. She says pt's son is at a funeral in [place] and the facility does not have anyone available who can pick him up so he will need to go EMS. Address is:   [place]

## 2022-07-15 ENCOUNTER — Ambulatory Visit (INDEPENDENT_AMBULATORY_CARE_PROVIDER_SITE_OTHER): Payer: Medicare Other | Admitting: Dermatology

## 2022-07-15 DIAGNOSIS — L570 Actinic keratosis: Secondary | ICD-10-CM | POA: Diagnosis not present

## 2022-07-15 DIAGNOSIS — L820 Inflamed seborrheic keratosis: Secondary | ICD-10-CM | POA: Diagnosis not present

## 2022-07-15 DIAGNOSIS — L578 Other skin changes due to chronic exposure to nonionizing radiation: Secondary | ICD-10-CM | POA: Diagnosis not present

## 2022-07-15 DIAGNOSIS — D692 Other nonthrombocytopenic purpura: Secondary | ICD-10-CM

## 2022-07-15 DIAGNOSIS — L821 Other seborrheic keratosis: Secondary | ICD-10-CM

## 2022-07-15 NOTE — Patient Instructions (Signed)
Cryotherapy Aftercare  Wash gently with soap and water everyday.   Apply Vaseline and Band-Aid daily until healed.     Due to recent changes in healthcare laws, you may see results of your pathology and/or laboratory studies on MyChart before the doctors have had a chance to review them. We understand that in some cases there may be results that are confusing or concerning to you. Please understand that not all results are received at the same time and often the doctors may need to interpret multiple results in order to provide you with the best plan of care or course of treatment. Therefore, we ask that you please give us 2 business days to thoroughly review all your results before contacting the office for clarification. Should we see a critical lab result, you will be contacted sooner.   If You Need Anything After Your Visit  If you have any questions or concerns for your doctor, please call our main line at [phone] and press option 4 to reach your doctor's medical assistant. If no one answers, please leave a voicemail as directed and we will return your call as soon as possible. Messages left after 4 pm will be answered the following business day.   You may also send us a message via MyChart. We typically respond to MyChart messages within 1-2 business days.  For prescription refills, please ask your pharmacy to contact our office. Our fax number is [phone].  If you have an urgent issue when the clinic is closed that cannot wait until the next business day, you can page your doctor at the number below.    Please note that while we do our best to be available for urgent issues outside of office hours, we are not available 24/7.   If you have an urgent issue and are unable to reach us, you may choose to seek medical care at your doctor's office, retail clinic, urgent care center, or emergency room.  If you have a medical emergency, please immediately call 911 or go to the  emergency department.  Pager Numbers  - Dr. Kowalski: [phone]  - Dr. Moye: [phone]  - Dr. Stewart: [phone]  In the event of inclement weather, please call our main line at [phone] for an update on the status of any delays or closures.  Dermatology Medication Tips: Please keep the boxes that topical medications come in in order to help keep track of the instructions about where and how to use these. Pharmacies typically print the medication instructions only on the boxes and not directly on the medication tubes.   If your medication is too expensive, please contact our office at [phone] option 4 or send us a message through MyChart.   We are unable to tell what your co-pay for medications will be in advance as this is different depending on your insurance coverage. However, we may be able to find a substitute medication at lower cost or fill out paperwork to get insurance to cover a needed medication.   If a prior authorization is required to get your medication covered by your insurance company, please allow us 1-2 business days to complete this process.  Drug prices often vary depending on where the prescription is filled and some pharmacies may offer cheaper prices.  The website www.goodrx.com contains coupons for medications through different pharmacies. The prices here do not account for what the cost may be with help from insurance (it may be cheaper with your insurance), but the website can   give you the price if you did not use any insurance.  - You can print the associated coupon and take it with your prescription to the pharmacy.  - You may also stop by our office during regular business hours and pick up a GoodRx coupon card.  - If you need your prescription sent electronically to a different pharmacy, notify our office through [facility] MyChart or by phone at [phone] option 4.     Si Usted Necesita Algo Despus de Su Visita  Tambin puede  enviarnos un mensaje a travs de MyChart. Por lo general respondemos a los mensajes de MyChart en el transcurso de 1 a 2 das hbiles.  Para renovar recetas, por favor pida a su farmacia que se ponga en contacto con nuestra oficina. Nuestro nmero de fax es el [phone].  Si tiene un asunto urgente cuando la clnica est cerrada y que no puede esperar hasta el siguiente da hbil, puede llamar/localizar a su doctor(a) al nmero que aparece a continuacin.   Por favor, tenga en cuenta que aunque hacemos todo lo posible para estar disponibles para asuntos urgentes fuera del horario de oficina, no estamos disponibles las 24 horas del da, los 7 das de la semana.   Si tiene un problema urgente y no puede comunicarse con nosotros, puede optar por buscar atencin mdica  en el consultorio de su doctor(a), en una clnica privada, en un centro de atencin urgente o en una sala de emergencias.  Si tiene una emergencia mdica, por favor llame inmediatamente al 911 o vaya a la sala de emergencias.  Nmeros de bper  - Dr. Kowalski: [phone]  - Dra. Moye: [phone]  - Dra. Stewart: [phone]  En caso de inclemencias del tiempo, por favor llame a nuestra lnea principal al [phone] para una actualizacin sobre el estado de cualquier retraso o cierre.  Consejos para la medicacin en dermatologa: Por favor, guarde las cajas en las que vienen los medicamentos de uso tpico para ayudarle a seguir las instrucciones sobre dnde y cmo usarlos. Las farmacias generalmente imprimen las instrucciones del medicamento slo en las cajas y no directamente en los tubos del medicamento.   Si su medicamento es muy caro, por favor, pngase en contacto con nuestra oficina llamando al [phone] y presione la opcin 4 o envenos un mensaje a travs de MyChart.   No podemos decirle cul ser su copago por los medicamentos por adelantado ya que esto es diferente dependiendo de la cobertura de su seguro.  Sin embargo, es posible que podamos encontrar un medicamento sustituto a menor costo o llenar un formulario para que el seguro cubra el medicamento que se considera necesario.   Si se requiere una autorizacin previa para que su compaa de seguros cubra su medicamento, por favor permtanos de 1 a 2 das hbiles para completar este proceso.  Los precios de los medicamentos varan con frecuencia dependiendo del lugar de dnde se surte la receta y alguna farmacias pueden ofrecer precios ms baratos.  El sitio web www.goodrx.com tiene cupones para medicamentos de diferentes farmacias. Los precios aqu no tienen en cuenta lo que podra costar con la ayuda del seguro (puede ser ms barato con su seguro), pero el sitio web puede darle el precio si no utiliz ningn seguro.  - Puede imprimir el cupn correspondiente y llevarlo con su receta a la farmacia.  - Tambin puede pasar por nuestra oficina durante el horario de atencin regular y recoger una tarjeta de cupones de GoodRx.  -   Si necesita que su receta se enve electrnicamente a una farmacia diferente, informe a nuestra oficina a travs de MyChart de [place] o por telfono llamando al [phone] y presione la opcin 4.  

## 2022-07-15 NOTE — Progress Notes (Signed)
Follow-Up Visit   Subjective  Darryl Novak is a 88 y.o. male who presents for the following: Follow-up. Patient here today for AK and ISK follow up. The patient has spots, moles and lesions to be evaluated, some may be new or changing and the patient has concerns that these could be cancer. Patient accompanied by son who contributes to history.   The following portions of the chart were reviewed this encounter and updated as appropriate:   Tobacco  Allergies  Meds  Problems  Med Hx  Surg Hx  Fam Hx     Review of Systems:  No other skin or systemic complaints except as noted in HPI or Assessment and Plan.  Objective  Well appearing patient in no apparent distress; mood and affect are within normal limits.  A focused examination was performed including face, scalp, ears, arms and hands. Relevant physical exam findings are noted in the Assessment and Plan.  L infraorbital x 1, scalp x 12 (13) Erythematous thin papules/macules with gritty scale.   R brow x 1, R dorsum hand x 1, R wrist x 1 (3) Erythematous stuck-on, waxy papule or plaque   Assessment & Plan  AK (actinic keratosis) (13) L infraorbital x 1, scalp x 12  Actinic keratoses are precancerous spots that appear secondary to cumulative UV radiation exposure/sun exposure over time. They are chronic with expected duration over 1 year. A portion of actinic keratoses will progress to squamous cell carcinoma of the skin. It is not possible to reliably predict which spots will progress to skin cancer and so treatment is recommended to prevent development of skin cancer.  Recommend daily broad spectrum sunscreen SPF 30+ to sun-exposed areas, reapply every 2 hours as needed.  Recommend staying in the shade or wearing long sleeves, sun glasses (UVA+UVB protection) and wide brim hats (4-inch brim around the entire circumference of the hat). Call for new or changing lesions.   Destruction of lesion - L infraorbital x 1,  scalp x 12 Complexity: simple   Destruction method: cryotherapy   Informed consent: discussed and consent obtained   Timeout:  patient name, date of birth, surgical site, and procedure verified Lesion destroyed using liquid nitrogen: Yes   Region frozen until ice ball extended beyond lesion: Yes   Outcome: patient tolerated procedure well with no complications   Post-procedure details: wound care instructions given    Inflamed seborrheic keratosis (3) R brow x 1, R dorsum hand x 1, R wrist x 1  Symptomatic, irritating, patient would like treated.   Destruction of lesion - R brow x 1, R dorsum hand x 1, R wrist x 1 Complexity: simple   Destruction method: cryotherapy   Informed consent: discussed and consent obtained   Timeout:  patient name, date of birth, surgical site, and procedure verified Lesion destroyed using liquid nitrogen: Yes   Region frozen until ice ball extended beyond lesion: Yes   Outcome: patient tolerated procedure well with no complications   Post-procedure details: wound care instructions given    Actinic Damage - chronic, secondary to cumulative UV radiation exposure/sun exposure over time - diffuse scaly erythematous macules with underlying dyspigmentation - Recommend daily broad spectrum sunscreen SPF 30+ to sun-exposed areas, reapply every 2 hours as needed.  - Recommend staying in the shade or wearing long sleeves, sun glasses (UVA+UVB protection) and wide brim hats (4-inch brim around the entire circumference of the hat). - Call for new or changing lesions.  Seborrheic Keratoses - Stuck-on,  waxy, tan-brown papules and/or plaques  - Benign-appearing - Discussed benign etiology and prognosis. - Observe - Call for any changes  Purpura - Chronic; persistent and recurrent.  Treatable, but not curable. - Violaceous macules and patches - Benign - Related to trauma, age, sun damage and/or use of blood thinners, chronic use of topical and/or oral  steroids - Observe - Can use OTC arnica containing moisturizer such as Dermend Bruise Formula if desired - Call for worsening or other concerns  Return in about 6 months (around 01/14/2023) for AK follow up.  Darryl Novak, RMA, am acting as scribe for Darryl Ser, MD . Documentation: I have reviewed the above documentation for accuracy and completeness, and I agree with the above.  Darryl Ser, MD

## 2022-07-26 ENCOUNTER — Encounter: Payer: Self-pay | Admitting: Dermatology

## 2022-08-28 ENCOUNTER — Other Ambulatory Visit: Payer: Self-pay

## 2022-08-28 ENCOUNTER — Emergency Department
Admission: EM | Admit: 2022-08-28 | Discharge: 2022-08-28 | Disposition: A | Discharge status: Skilled Nursing Facility | Payer: Medicare Other | Attending: Emergency Medicine | Admitting: Emergency Medicine

## 2022-08-28 DIAGNOSIS — I2510 Atherosclerotic heart disease of native coronary artery without angina pectoris: Secondary | ICD-10-CM | POA: Diagnosis not present

## 2022-08-28 DIAGNOSIS — W19XXXA Unspecified fall, initial encounter: Secondary | ICD-10-CM

## 2022-08-28 DIAGNOSIS — W01198A Fall on same level from slipping, tripping and stumbling with subsequent striking against other object, initial encounter: Secondary | ICD-10-CM | POA: Insufficient documentation

## 2022-08-28 DIAGNOSIS — Y92129 Unspecified place in nursing home as the place of occurrence of the external cause: Secondary | ICD-10-CM | POA: Insufficient documentation

## 2022-08-28 DIAGNOSIS — S51812A Laceration without foreign body of left forearm, initial encounter: Secondary | ICD-10-CM | POA: Insufficient documentation

## 2022-08-28 DIAGNOSIS — S59912A Unspecified injury of left forearm, initial encounter: Secondary | ICD-10-CM | POA: Diagnosis present

## 2022-08-28 MED ORDER — LIDOCAINE-EPINEPHRINE (PF) 2 %-1:200000 IJ SOLN
10.0000 mL | Freq: Once | INTRAMUSCULAR | Status: AC
  Administered 2022-08-28: 10 mL
  Filled 2022-08-28: qty 20

## 2022-08-28 NOTE — ED Triage Notes (Signed)
Pt comes via [facility] from [place] in [employer]. Pt states he sat down because he couldn't stand. Pt hit right shoulder on way down. Pt is on thinners but no hitting head or loc.

## 2022-08-28 NOTE — Discharge Instructions (Signed)
Please follow-up with your doctor in 7 to 10 days for suture removal.  Please keep the area covered.  Return to the emergency department for any signs of infection such as increased redness, pus, fever or any other symptom personally concerning to yourself.

## 2022-08-28 NOTE — ED Provider Notes (Signed)
   [facility] Provider Note    Event Date/Time   First MD Initiated Contact with Patient 08/28/22 1733     (approximate)   History   Chief Complaint Fall   HPI Darryl Novak is a 88 y.o. male presents emergency room for fall.  Patient has a 8 cm laceration/skin tear to the left elbow/forearm.  I was asked by Dr. Vicente Males to assist with repair.  Patient tolerated the procedure well.  See procedure details below.  Advised him that she will need to have this removed in 10 days.  Encouraged him to wash daily with soap and water and apply topical antibiotic ointment daily as well.    Marland Kitchen.Laceration Repair  Date/Time: 08/28/2022 7:37 PM  Performed by: Teodoro Spray, PA Authorized by: Teodoro Spray, PA   Consent:    Consent obtained:  Verbal   Consent given by:  Patient   Risks, benefits, and alternatives were discussed: yes     Risks discussed:  Infection, pain, nerve damage, need for additional repair, poor cosmetic result, poor wound healing, vascular damage, tendon damage and retained foreign body   Alternatives discussed:  No treatment Universal protocol:    Patient identity confirmed:  Verbally with patient Anesthesia:    Anesthesia method:  Local infiltration   Local anesthetic:  Lidocaine 2% WITH epi Laceration details:    Location:  Shoulder/arm   Shoulder/arm location:  L lower arm   Length (cm):  8   Depth (mm):  3 Pre-procedure details:    Preparation:  Patient was prepped and draped in usual sterile fashion Exploration:    Hemostasis achieved with:  Direct pressure   Wound extent: no foreign body, no signs of injury, no nerve damage, no tendon damage, no underlying fracture and no vascular damage   Treatment:    Area cleansed with:  Saline   Amount of cleaning:  Standard   Irrigation solution:  Sterile saline   Irrigation volume:  1000 ml   Irrigation method:  Pressure wash   Debridement:  None   Undermining:   None Skin repair:    Repair method:  Sutures   Suture size:  4-0   Suture material:  Nylon   Suture technique:  Simple interrupted   Number of sutures:  10 Approximation:    Approximation:  Close Repair type:    Repair type:  Simple Post-procedure details:    Dressing:  Sterile dressing, tube gauze and adhesive bandage   Procedure completion:  Tolerated well, no immediate complications     MEDICATIONS ORDERED IN ED: Medications  lidocaine-EPINEPHrine (XYLOCAINE W/EPI) 2 %-1:200000 (PF) injection 10 mL (10 mLs Infiltration Given 08/28/22 1900)      Teodoro Spray, PA 08/28/22 1942    Harvest Dark, MD 08/28/22 2212

## 2022-08-28 NOTE — ED Provider Notes (Signed)
[facility] Provider Note    Event Date/Time   First MD Initiated Contact with Patient 08/28/22 1733     (approximate)  History   Chief Complaint: Fall  HPI  Darryl Novak is a 88 y.o. male with a past medical history of prior CVA, CAD, MI, neuropathy, presents to the emergency department for a fall.  According to the son patient lives at home placed in [place].  Patient has a history of weakness and needs assistance when ambulating.  Son states the patient's health aide who is with him was not strong enough to keep him upright and he helped the patient down to the ground however on the way down the patient hit his right arm on a piece of furniture causing a laceration/skin tear to the right forearm.  Son states the patient did not hit his head.  No LOC.  No nausea or vomiting.  No headache.  They were concerned given the large skin tear so they sent him to the emergency department for evaluation.  Physical Exam   Triage Vital Signs: ED Triage Vitals  Enc Vitals Group     BP 08/28/22 1703 (!) 159/68     Pulse Rate 08/28/22 1703 79     Resp 08/28/22 1703 17     Temp 08/28/22 1703 98.3 F (36.8 C)     Temp Source 08/28/22 1703 Oral     SpO2 08/28/22 1703 94 %     Weight 08/28/22 1706 160 lb (72.6 kg)     Height 08/28/22 1706 '5\' 8"'$  (1.727 m)     Head Circumference --      Peak Flow --      Pain Score 08/28/22 1709 5     Pain Loc --      Pain Edu? --      Excl. in [place]? --     Most recent vital signs: Vitals:   08/28/22 1703  BP: (!) 159/68  Pulse: 79  Resp: 17  Temp: 98.3 F (36.8 C)  SpO2: 94%    General: Awake, no distress.  CV:  Good peripheral perfusion.  Regular rate and rhythm  Resp:  Normal effort.  Equal breath sounds bilaterally.  Abd:  No distention.  Soft, nontender.  No rebound or guarding. Other:  Approximate 8 cm laceration/skin tear to the left forearm.   ED Results / Procedures / Treatments   MEDICATIONS ORDERED IN  ED: Medications  lidocaine-EPINEPHrine (XYLOCAINE W/EPI) 2 %-1:200000 (PF) injection 10 mL (10 mLs Infiltration Given 08/28/22 1900)     IMPRESSION / MDM / ASSESSMENT AND PLAN / ED COURSE  I reviewed the triage vital signs and the nursing notes.  Patient's presentation is most consistent with acute illness / injury with system symptoms.  Patient presents emergency department after a fall at his nursing facility with a 8 cm skin tear to the left forearm.  Continues to ooze a small amount of blood.  No other injuries.  Patient has no other complaints.  Attempted to place Steri-Strips over the skin tear however given the small amount of oozing I have saturated the Steri-Strips I believe suturing would allow for better closure.  Physician assistant Mardee Postin will be performing the laceration repair.  Otherwise the patient appears well and I believe the patient will be safe for discharge home.  Son agrees.  FINAL CLINICAL IMPRESSION(S) / ED DIAGNOSES   Skin tear/laceration   Note:  This document was prepared using Dragon voice recognition  software and may include unintentional dictation errors.   Harvest Dark, MD 08/28/22 2252

## 2022-10-10 ENCOUNTER — Encounter: Payer: Self-pay | Admitting: Podiatry

## 2022-10-10 ENCOUNTER — Ambulatory Visit (INDEPENDENT_AMBULATORY_CARE_PROVIDER_SITE_OTHER): Payer: Medicare Other | Admitting: Podiatry

## 2022-10-10 VITALS — BP 124/63 | HR 80

## 2022-10-10 DIAGNOSIS — D689 Coagulation defect, unspecified: Secondary | ICD-10-CM | POA: Diagnosis not present

## 2022-10-10 DIAGNOSIS — M79674 Pain in right toe(s): Secondary | ICD-10-CM

## 2022-10-10 DIAGNOSIS — N179 Acute kidney failure, unspecified: Secondary | ICD-10-CM

## 2022-10-10 DIAGNOSIS — B351 Tinea unguium: Secondary | ICD-10-CM | POA: Diagnosis not present

## 2022-10-10 DIAGNOSIS — I739 Peripheral vascular disease, unspecified: Secondary | ICD-10-CM

## 2022-10-10 DIAGNOSIS — M79675 Pain in left toe(s): Secondary | ICD-10-CM

## 2022-10-10 NOTE — Progress Notes (Signed)
This patient returns to my office for at risk foot care.  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  Patient is taking eliquiss.  This patient is unable to cut nails himself since the patient cannot reach his nails.These nails are painful walking and wearing shoes.  This patient presents for at risk foot care today. He presents to the office  with his son .  General Appearance  Alert, conversant and in no acute stress.  Vascular  Dorsalis pedis and posterior tibial  pulses are weakly  palpable  bilaterally.  Capillary return is within normal limits  bilaterally. Cold feet.  Absent hair.  Neurologic  Senn-Weinstein monofilament wire test within normal limits  bilaterally. Muscle power within normal limits bilaterally.  Nails Thick disfigured discolored nails with subungual debris  from hallux to fifth toes bilaterally. No evidence of bacterial infection or drainage bilaterally.  Orthopedic  No limitations of motion  feet .  No crepitus or effusions noted.  No bony pathology or digital deformities noted.  Skin  thin skin with no porokeratosis noted bilaterally.  No signs of infections or ulcers noted.     Onychomycosis  Pain in right toes  Pain in left toes  Consent was obtained for treatment procedures.   Mechanical debridement of nails 1-5  bilaterally performed with a nail nipper.  Filed with dremel without incident. Cauterized fourth toe right foot.   Return office visit     12 weeks                 Told patient to return for periodic foot care and evaluation due to potential at risk complications.   Gardiner Barefoot DPM

## 2022-10-31 ENCOUNTER — Ambulatory Visit (INDEPENDENT_AMBULATORY_CARE_PROVIDER_SITE_OTHER): Payer: Medicare Other | Admitting: Dermatology

## 2022-10-31 VITALS — BP 132/64 | HR 80

## 2022-10-31 DIAGNOSIS — L820 Inflamed seborrheic keratosis: Secondary | ICD-10-CM

## 2022-10-31 DIAGNOSIS — C44329 Squamous cell carcinoma of skin of other parts of face: Secondary | ICD-10-CM

## 2022-10-31 DIAGNOSIS — L570 Actinic keratosis: Secondary | ICD-10-CM | POA: Diagnosis not present

## 2022-10-31 DIAGNOSIS — L578 Other skin changes due to chronic exposure to nonionizing radiation: Secondary | ICD-10-CM | POA: Diagnosis not present

## 2022-10-31 DIAGNOSIS — C44320 Squamous cell carcinoma of skin of unspecified parts of face: Secondary | ICD-10-CM

## 2022-10-31 DIAGNOSIS — D492 Neoplasm of unspecified behavior of bone, soft tissue, and skin: Secondary | ICD-10-CM

## 2022-10-31 NOTE — Progress Notes (Signed)
Follow-Up Visit   Subjective  Darryl Novak is a 89 y.o. male who presents for the following: Skin Problem (The patient has a spot on the right eyebrow to be evaluated, this area was treated with LN2 3 months ago, area now painful). The patient has spots, moles and lesions to be evaluated, some may be new or changing and the patient has concerns that these could be cancer.  Son Darryl Novak with patient   The following portions of the chart were reviewed this encounter and updated as appropriate:   Tobacco  Allergies  Meds  Problems  Med Hx  Surg Hx  Fam Hx     Review of Systems:  No other skin or systemic complaints except as noted in HPI or Assessment and Plan.  Objective  Well appearing patient in no apparent distress; mood and affect are within normal limits.  A focused examination was performed including face. Relevant physical exam findings are noted in the Assessment and Plan.  scalp x 2, right nose x 1 (3) (3) Erythematous thin papules/macules with gritty scale.   right temple x 1 Stuck-on, waxy, tan-brown papule -- Discussed benign etiology and prognosis.   right eyebrow 1.5 cm Crusty keratotic papule       Assessment & Plan  AK (actinic keratosis) (3) scalp x 2, right nose x 1 (3)  Actinic keratoses are precancerous spots that appear secondary to cumulative UV radiation exposure/sun exposure over time. They are chronic with expected duration over 1 year. A portion of actinic keratoses will progress to squamous cell carcinoma of the skin. It is not possible to reliably predict which spots will progress to skin cancer and so treatment is recommended to prevent development of skin cancer.  Recommend daily broad spectrum sunscreen SPF 30+ to sun-exposed areas, reapply every 2 hours as needed.  Recommend staying in the shade or wearing long sleeves, sun glasses (UVA+UVB protection) and wide brim hats (4-inch brim around the entire circumference of the hat). Call for  new or changing lesions.   Destruction of lesion - scalp x 2, right nose x 1 (3) Complexity: simple   Destruction method: cryotherapy   Informed consent: discussed and consent obtained   Timeout:  patient name, date of birth, surgical site, and procedure verified Lesion destroyed using liquid nitrogen: Yes   Region frozen until ice ball extended beyond lesion: Yes   Outcome: patient tolerated procedure well with no complications   Post-procedure details: wound care instructions given    Inflamed seborrheic keratosis right temple x 1  Symptomatic, irritating, patient would like treated.   Destruction of lesion - right temple x 1 Complexity: simple   Destruction method: cryotherapy   Informed consent: discussed and consent obtained   Timeout:  patient name, date of birth, surgical site, and procedure verified Lesion destroyed using liquid nitrogen: Yes   Region frozen until ice ball extended beyond lesion: Yes   Outcome: patient tolerated procedure well with no complications   Post-procedure details: wound care instructions given    Neoplasm of skin right eyebrow  Epidermal / dermal shaving  Lesion diameter (cm):  1.5 Informed consent: discussed and consent obtained   Timeout: patient name, date of birth, surgical site, and procedure verified   Procedure prep:  Patient was prepped and draped in usual sterile fashion Prep type:  Isopropyl alcohol Anesthesia: the lesion was anesthetized in a standard fashion   Anesthetic:  1% lidocaine w/ epinephrine 1-100,000 buffered w/ 8.4% NaHCO3 Hemostasis achieved with:  pressure, aluminum chloride and electrodesiccation   Outcome: patient tolerated procedure well   Post-procedure details: sterile dressing applied and wound care instructions given   Dressing type: bandage and petrolatum    Destruction of lesion Complexity: extensive   Destruction method: electrodesiccation and curettage   Informed consent: discussed and consent  obtained   Timeout:  patient name, date of birth, surgical site, and procedure verified Procedure prep:  Patient was prepped and draped in usual sterile fashion Prep type:  Isopropyl alcohol Anesthesia: the lesion was anesthetized in a standard fashion   Anesthetic:  1% lidocaine w/ epinephrine 1-100,000 buffered w/ 8.4% NaHCO3 Curettage performed in three different directions: Yes   Electrodesiccation performed over the curetted area: Yes   Lesion length (cm):  1.5 Lesion width (cm):  1.5 Margin per side (cm):  0.5 Final wound size (cm):  2.5 Hemostasis achieved with:  pressure, aluminum chloride and electrodesiccation Outcome: patient tolerated procedure well with no complications   Post-procedure details: sterile dressing applied and wound care instructions given   Dressing type: bandage and petrolatum    Specimen 1 - Surgical pathology Differential Diagnosis: ISK r/o SCC  Check Margins: No Crusty keratotic papule  Actinic Damage - chronic, secondary to cumulative UV radiation exposure/sun exposure over time - diffuse scaly erythematous macules with underlying dyspigmentation - Recommend daily broad spectrum sunscreen SPF 30+ to sun-exposed areas, reapply every 2 hours as needed.  - Recommend staying in the shade or wearing long sleeves, sun glasses (UVA+UVB protection) and wide brim hats (4-inch brim around the entire circumference of the hat). - Call for new or changing lesions.  Return for hx of skin skin cancers in April as scheduled .  IMarye Novak, CMA, am acting as scribe for Darryl Ser, MD .  Documentation: I have reviewed the above documentation for accuracy and completeness, and I agree with the above.  Darryl Ser, MD

## 2022-10-31 NOTE — Patient Instructions (Addendum)
Wound Care Instructions  Cleanse wound gently with soap and water once a day then pat dry with clean gauze. Apply a thin coat of Petrolatum (petroleum jelly, "Vaseline") over the wound (unless you have an allergy to this). We recommend that you use a new, sterile tube of Vaseline. Do not pick or remove scabs. Do not remove the yellow or white "healing tissue" from the base of the wound.  Cover the wound with fresh, clean, nonstick gauze and secure with paper tape. You may use Band-Aids in place of gauze and tape if the wound is small enough, but would recommend trimming much of the tape off as there is often too much. Sometimes Band-Aids can irritate the skin.  You should call the office for your biopsy report after 1 week if you have not already been contacted.  If you experience any problems, such as abnormal amounts of bleeding, swelling, significant bruising, significant pain, or evidence of infection, please call the office immediately.  FOR ADULT SURGERY PATIENTS: If you need something for pain relief you may take 1 extra strength Tylenol (acetaminophen) AND 2 Ibuprofen (200mg each) together every 4 hours as needed for pain. (do not take these if you are allergic to them or if you have a reason you should not take them.) Typically, you may only need pain medication for 1 to 3 days.     Cryotherapy Aftercare  Wash gently with soap and water everyday.   Apply Vaseline and Band-Aid daily until healed.    Due to recent changes in healthcare laws, you may see results of your pathology and/or laboratory studies on MyChart before the doctors have had a chance to review them. We understand that in some cases there may be results that are confusing or concerning to you. Please understand that not all results are received at the same time and often the doctors may need to interpret multiple results in order to provide you with the best plan of care or course of treatment. Therefore, we ask that you  please give us 2 business days to thoroughly review all your results before contacting the office for clarification. Should we see a critical lab result, you will be contacted sooner.   If You Need Anything After Your Visit  If you have any questions or concerns for your doctor, please call our main line at [phone] and press option 4 to reach your doctor's medical assistant. If no one answers, please leave a voicemail as directed and we will return your call as soon as possible. Messages left after 4 pm will be answered the following business day.   You may also send us a message via MyChart. We typically respond to MyChart messages within 1-2 business days.  For prescription refills, please ask your pharmacy to contact our office. Our fax number is [phone].  If you have an urgent issue when the clinic is closed that cannot wait until the next business day, you can page your doctor at the number below.    Please note that while we do our best to be available for urgent issues outside of office hours, we are not available 24/7.   If you have an urgent issue and are unable to reach us, you may choose to seek medical care at your doctor's office, retail clinic, urgent care center, or emergency room.  If you have a medical emergency, please immediately call 911 or go to the emergency department.  Pager Numbers  - Dr. Kowalski: [phone]  -   Dr. Moye: [phone]  - Dr. Stewart: [phone]  In the event of inclement weather, please call our main line at [phone] for an update on the status of any delays or closures.  Dermatology Medication Tips: Please keep the boxes that topical medications come in in order to help keep track of the instructions about where and how to use these. Pharmacies typically print the medication instructions only on the boxes and not directly on the medication tubes.   If your medication is too expensive, please contact our office at  [phone] option 4 or send us a message through MyChart.   We are unable to tell what your co-pay for medications will be in advance as this is different depending on your insurance coverage. However, we may be able to find a substitute medication at lower cost or fill out paperwork to get insurance to cover a needed medication.   If a prior authorization is required to get your medication covered by your insurance company, please allow us 1-2 business days to complete this process.  Drug prices often vary depending on where the prescription is filled and some pharmacies may offer cheaper prices.  The website www.goodrx.com contains coupons for medications through different pharmacies. The prices here do not account for what the cost may be with help from insurance (it may be cheaper with your insurance), but the website can give you the price if you did not use any insurance.  - You can print the associated coupon and take it with your prescription to the pharmacy.  - You may also stop by our office during regular business hours and pick up a GoodRx coupon card.  - If you need your prescription sent electronically to a different pharmacy, notify our office through [facility] MyChart or by phone at [phone] option 4.     Si Usted Necesita Algo Despus de Su Visita  Tambin puede enviarnos un mensaje a travs de MyChart. Por lo general respondemos a los mensajes de MyChart en el transcurso de 1 a 2 das hbiles.  Para renovar recetas, por favor pida a su farmacia que se ponga en contacto con nuestra oficina. Nuestro nmero de fax es el [phone].  Si tiene un asunto urgente cuando la clnica est cerrada y que no puede esperar hasta el siguiente da hbil, puede llamar/localizar a su doctor(a) al nmero que aparece a continuacin.   Por favor, tenga en cuenta que aunque hacemos todo lo posible para estar disponibles para asuntos urgentes fuera del horario de oficina, no estamos  disponibles las 24 horas del da, los 7 das de la semana.   Si tiene un problema urgente y no puede comunicarse con nosotros, puede optar por buscar atencin mdica  en el consultorio de su doctor(a), en una clnica privada, en un centro de atencin urgente o en una sala de emergencias.  Si tiene una emergencia mdica, por favor llame inmediatamente al 911 o vaya a la sala de emergencias.  Nmeros de bper  - Dr. Kowalski: [phone]  - Dra. Moye: [phone]  - Dra. Stewart: [phone]  En caso de inclemencias del tiempo, por favor llame a nuestra lnea principal al [phone] para una actualizacin sobre el estado de cualquier retraso o cierre.  Consejos para la medicacin en dermatologa: Por favor, guarde las cajas en las que vienen los medicamentos de uso tpico para ayudarle a seguir las instrucciones sobre dnde y cmo usarlos. Las farmacias generalmente imprimen las instrucciones del medicamento slo en las cajas y   no directamente en los tubos del medicamento.   Si su medicamento es muy caro, por favor, pngase en contacto con nuestra oficina llamando al [phone] y presione la opcin 4 o envenos un mensaje a travs de MyChart.   No podemos decirle cul ser su copago por los medicamentos por adelantado ya que esto es diferente dependiendo de la cobertura de su seguro. Sin embargo, es posible que podamos encontrar un medicamento sustituto a menor costo o llenar un formulario para que el seguro cubra el medicamento que se considera necesario.   Si se requiere una autorizacin previa para que su compaa de seguros cubra su medicamento, por favor permtanos de 1 a 2 das hbiles para completar este proceso.  Los precios de los medicamentos varan con frecuencia dependiendo del lugar de dnde se surte la receta y alguna farmacias pueden ofrecer precios ms baratos.  El sitio web www.goodrx.com tiene cupones para medicamentos de diferentes farmacias. Los precios aqu no  tienen en cuenta lo que podra costar con la ayuda del seguro (puede ser ms barato con su seguro), pero el sitio web puede darle el precio si no utiliz ningn seguro.  - Puede imprimir el cupn correspondiente y llevarlo con su receta a la farmacia.  - Tambin puede pasar por nuestra oficina durante el horario de atencin regular y recoger una tarjeta de cupones de GoodRx.  - Si necesita que su receta se enve electrnicamente a una farmacia diferente, informe a nuestra oficina a travs de MyChart de [facility] o por telfono llamando al [phone] y presione la opcin 4.  

## 2022-11-04 ENCOUNTER — Encounter: Payer: Self-pay | Admitting: Dermatology

## 2022-11-07 ENCOUNTER — Telehealth: Payer: Self-pay

## 2022-11-07 NOTE — Telephone Encounter (Signed)
Left voice mail to return my call.

## 2022-11-07 NOTE — Telephone Encounter (Signed)
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

## 2022-11-12 ENCOUNTER — Telehealth: Payer: Self-pay

## 2022-11-12 NOTE — Telephone Encounter (Signed)
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

## 2022-11-12 NOTE — Telephone Encounter (Signed)
Patient notified of bx results  

## 2023-01-09 ENCOUNTER — Ambulatory Visit (INDEPENDENT_AMBULATORY_CARE_PROVIDER_SITE_OTHER): Payer: Medicare Other | Admitting: Podiatry

## 2023-01-09 ENCOUNTER — Encounter: Payer: Self-pay | Admitting: Podiatry

## 2023-01-09 VITALS — BP 168/84 | HR 81

## 2023-01-09 DIAGNOSIS — I739 Peripheral vascular disease, unspecified: Secondary | ICD-10-CM

## 2023-01-09 DIAGNOSIS — M79675 Pain in left toe(s): Secondary | ICD-10-CM | POA: Diagnosis not present

## 2023-01-09 DIAGNOSIS — M79674 Pain in right toe(s): Secondary | ICD-10-CM

## 2023-01-09 DIAGNOSIS — B351 Tinea unguium: Secondary | ICD-10-CM

## 2023-01-09 NOTE — Progress Notes (Signed)
This patient returns to my office for at risk foot care.  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  Patient is taking eliquiss.  This patient is unable to cut nails himself since the patient cannot reach his nails.These nails are painful walking and wearing shoes.  This patient presents for at risk foot care today. He presents to the office  with his son .  General Appearance  Alert, conversant and in no acute stress.  Vascular  Dorsalis pedis and posterior tibial  pulses are weakly  palpable  bilaterally.  Capillary return is within normal limits  bilaterally. Cold feet.  Absent hair.  Neurologic  Senn-Weinstein monofilament wire test within normal limits  bilaterally. Muscle power within normal limits bilaterally.  Nails Thick disfigured discolored nails with subungual debris  from hallux to fifth toes bilaterally. No evidence of bacterial infection or drainage bilaterally.  Orthopedic  No limitations of motion  feet .  No crepitus or effusions noted.  No bony pathology or digital deformities noted.  Skin  thin skin with no porokeratosis noted bilaterally.  No signs of infections or ulcers noted.     Onychomycosis  Pain in right toes  Pain in left toes  Consent was obtained for treatment procedures.   Mechanical debridement of nails 1-5  bilaterally performed with a nail nipper.  Filed with dremel without incident. Cauterized fourth toe right foot.   Return office visit     12 weeks                 Told patient to return for periodic foot care and evaluation due to potential at risk complications.   Morine Kohlman DPM  

## 2023-01-15 ENCOUNTER — Ambulatory Visit (INDEPENDENT_AMBULATORY_CARE_PROVIDER_SITE_OTHER): Payer: Medicare Other | Admitting: Dermatology

## 2023-01-15 VITALS — BP 146/89 | HR 64

## 2023-01-15 DIAGNOSIS — L578 Other skin changes due to chronic exposure to nonionizing radiation: Secondary | ICD-10-CM | POA: Diagnosis not present

## 2023-01-15 DIAGNOSIS — Z79899 Other long term (current) drug therapy: Secondary | ICD-10-CM

## 2023-01-15 DIAGNOSIS — L570 Actinic keratosis: Secondary | ICD-10-CM

## 2023-01-15 DIAGNOSIS — L820 Inflamed seborrheic keratosis: Secondary | ICD-10-CM

## 2023-01-15 DIAGNOSIS — Z7189 Other specified counseling: Secondary | ICD-10-CM

## 2023-01-15 DIAGNOSIS — Z5111 Encounter for antineoplastic chemotherapy: Secondary | ICD-10-CM

## 2023-01-15 MED ORDER — FLUOROURACIL 5 % EX CREA: TOPICAL_CREAM | CUTANEOUS | 1 refills | Status: DC

## 2023-01-15 NOTE — Patient Instructions (Addendum)
Instructions for [facility] Medications  One or more of your medications was sent to the [facility] mail order compounding pharmacy. You will receive an email from them and can purchase the medicine through that link. It will then be mailed to your home at the address you confirmed. If for any reason you do not receive an email from them, please check your spam folder. If you still do not find the email, please let us know. [facility] phone number is [phone].   In 1 month begin  5FU/Calcipotriene cream twice a day x 7 days   5-Fluorouracil/Calcipotriene Patient Education   Actinic keratoses are the dry, red scaly spots on the skin caused by sun damage. A portion of these spots can turn into skin cancer with time, and treating them can help prevent development of skin cancer.   Treatment of these spots requires removal of the defective skin cells. There are various ways to remove actinic keratoses, including freezing with liquid nitrogen, treatment with creams, or treatment with a blue light procedure in the office.   5-fluorouracil cream is a topical cream used to treat actinic keratoses. It works by interfering with the growth of abnormal fast-growing skin cells, such as actinic keratoses. These cells peel off and are replaced by healthy ones.   5-fluorouracil/calcipotriene is a combination of the 5-fluorouracil cream with a vitamin D analog cream called calcipotriene. The calcipotriene alone does not treat actinic keratoses. However, when it is combined with 5-fluorouracil, it helps the 5-fluorouracil treat the actinic keratoses much faster so that the same results can be achieved with a much shorter treatment time.  INSTRUCTIONS FOR 5-FLUOROURACIL/CALCIPOTRIENE CREAM:   5-fluorouracil/calcipotriene cream typically only needs to be used for 4-7 days. A thin layer should be applied twice a day to the treatment areas recommended by your physician.   If your physician  prescribed you separate tubes of 5-fluourouracil and calcipotriene, apply a thin layer of 5-fluorouracil followed by a thin layer of calcipotriene.   Avoid contact with your eyes, nostrils, and mouth. Do not use 5-fluorouracil/calcipotriene cream on infected or open wounds.   You will develop redness, irritation and some crusting at areas where you have pre-cancer damage/actinic keratoses. IF YOU DEVELOP PAIN, BLEEDING, OR SIGNIFICANT CRUSTING, STOP THE TREATMENT EARLY - you have already gotten a good response and the actinic keratoses should clear up well.  Wash your hands after applying 5-fluorouracil 5% cream on your skin.   A moisturizer or sunscreen with a minimum SPF 30 should be applied each morning.   Once you have finished the treatment, you can apply a thin layer of Vaseline twice a day to irritated areas to soothe and calm the areas more quickly. If you experience significant discomfort, contact your physician.  For some patients it is necessary to repeat the treatment for best results.  SIDE EFFECTS: When using 5-fluorouracil/calcipotriene cream, you may have mild irritation, such as redness, dryness, swelling, or a mild burning sensation. This usually resolves within 2 weeks. The more actinic keratoses you have, the more redness and inflammation you can expect during treatment. Eye irritation has been reported rarely. If this occurs, please let us know.  If you have any trouble using this cream, please call the office. If you have any other questions about this information, please do not hesitate to ask me before you leave the office.       Due to recent changes in healthcare laws, you may see results of your pathology and/or laboratory  studies on MyChart before the doctors have had a chance to review them. We understand that in some cases there may be results that are confusing or concerning to you. Please understand that not all results are received at the same time and often the  doctors may need to interpret multiple results in order to provide you with the best plan of care or course of treatment. Therefore, we ask that you please give [place] 2 business days to thoroughly review all your results before contacting the office for clarification. Should we see a critical lab result, you will be contacted sooner.   If You Need Anything After Your Visit  If you have any questions or concerns for your doctor, please call our main line at [phone] and press option 4 to reach your doctor's medical assistant. If no one answers, please leave a voicemail as directed and we will return your call as soon as possible. Messages left after 4 pm will be answered the following business day.   You may also send [place] a message via MyChart. We typically respond to MyChart messages within 1-2 business days.  For prescription refills, please ask your pharmacy to contact our office. Our fax number is [phone].  If you have an urgent issue when the clinic is closed that cannot wait until the next business day, you can page your doctor at the number below.    Please note that while we do our best to be available for urgent issues outside of office hours, we are not available 24/7.   If you have an urgent issue and are unable to reach [place], you may choose to seek medical care at your doctor's office, retail clinic, urgent care center, or emergency room.  If you have a medical emergency, please immediately call 911 or go to the emergency department.  Pager Numbers  - Dr. Gwen Pounds: [phone]  - Dr. Neale Burly: [phone]  - Dr. Roseanne Reno: [phone]  In the event of inclement weather, please call our main line at [phone] for an update on the status of any delays or closures.  Dermatology Medication Tips: Please keep the boxes that topical medications come in in order to help keep track of the instructions about where and how to use these. Pharmacies typically print the medication  instructions only on the boxes and not directly on the medication tubes.   If your medication is too expensive, please contact our office at [phone] option 4 or send [place] a message through MyChart.   We are unable to tell what your co-pay for medications will be in advance as this is different depending on your insurance coverage. However, we may be able to find a substitute medication at lower cost or fill out paperwork to get insurance to cover a needed medication.   If a prior authorization is required to get your medication covered by your insurance company, please allow [place] 1-2 business days to complete this process.  Drug prices often vary depending on where the prescription is filled and some pharmacies may offer cheaper prices.  The website www.goodrx.com contains coupons for medications through different pharmacies. The prices here do not account for what the cost may be with help from insurance (it may be cheaper with your insurance), but the website can give you the price if you did not use any insurance.  - You can print the associated coupon and take it with your prescription to the pharmacy.  - You may also stop by our office during  regular business hours and pick up a GoodRx coupon card.  - If you need your prescription sent electronically to a different pharmacy, notify our office through [facility] or by phone at [phone] option 4.     Si Usted Necesita Algo Despus de Su Visita  Tambin puede enviarnos un mensaje a travs de Clinical cytogeneticist. Por lo general respondemos a los mensajes de MyChart en el transcurso de 1 a 2 das hbiles.  Para renovar recetas, por favor pida a su farmacia que se ponga en contacto con nuestra oficina. Annie Sable de fax es [place] [phone].  Si tiene un asunto urgente cuando la clnica est cerrada y que no puede esperar hasta el siguiente da hbil, puede llamar/localizar a su doctor(a) al nmero que aparece a continuacin.   Por  favor, tenga en cuenta que aunque hacemos todo lo posible para estar disponibles para asuntos urgentes fuera del horario de [place], no estamos disponibles las 24 horas del da, los 7 [address]  [address] de la [place].   Si tiene un problema urgente y no puede comunicarse con nosotros, puede optar por buscar atencin mdica  en el consultorio de su doctor(a), en una clnica privada, en un centro de atencin urgente o en una sala de emergencias.  Si tiene Engineer, drilling, por favor llame inmediatamente al 911 o vaya a la sala de emergencias.  Nmeros de bper  - Dr. Gwen Pounds: [phone]  - Dra. Moye: [phone]  - Dra. Roseanne Reno: [phone]  En caso de inclemencias del [place], por favor llame a Lacy Duverney principal al [phone] para una actualizacin sobre el [place] de cualquier retraso o cierre.  Consejos para la medicacin en dermatologa: Por favor, guarde las cajas en las que vienen los medicamentos de uso tpico para ayudarle a seguir las instrucciones sobre dnde y cmo usarlos. Las farmacias generalmente imprimen las instrucciones del medicamento slo en las cajas y no directamente en los tubos del [place].   Si su medicamento es muy caro, por favor, pngase en contacto con Rolm Gala llamando al [phone] y presione la opcin 4 o envenos un mensaje a travs de Clinical cytogeneticist.   No podemos decirle cul ser su copago por los medicamentos por adelantado ya que esto es diferente dependiendo de la cobertura de su seguro. Sin embargo, es posible que podamos encontrar un medicamento sustituto a Audiological scientist un formulario para que el seguro cubra el medicamento que se considera necesario.   Si se requiere una autorizacin previa para que su compaa de seguros [place] su medicamento, por favor permtanos de 1 a 2 das hbiles para completar [address]  Los precios de los medicamentos varan con frecuencia dependiendo del Environmental consultant de dnde se surte la receta y alguna farmacias  pueden ofrecer precios ms baratos.  El sitio web www.goodrx.com tiene cupones para medicamentos de Health and safety inspector. Los precios aqu no tienen en cuenta lo que podra costar con la ayuda del seguro (puede ser ms barato con su seguro), pero el sitio web puede darle el precio si no utiliz Tourist information centre manager.  - Puede imprimir el cupn correspondiente y llevarlo con su receta a la farmacia.  - Tambin puede pasar por nuestra oficina durante el horario de atencin regular y Education officer, museum una tarjeta de cupones de GoodRx.  - Si necesita que su receta se enve electrnicamente a una farmacia diferente, informe a nuestra oficina a travs de MyChart de [place] o por telfono llamando al [phone] y presione la opcin 4.

## 2023-01-15 NOTE — Progress Notes (Signed)
Follow-Up Visit   Subjective  Darryl Novak is a 89 y.o. male who presents for the following: 2 months f/u hx of precancers. The patient has spots  to be evaluated, some may be new or changing and the patient has concerns that these could be cancer.   Son with patient   The following portions of the chart were reviewed this encounter and updated as appropriate: medications, allergies, medical history  Review of Systems:  No other skin or systemic complaints except as noted in HPI or Assessment and Plan.  Objective  Well appearing patient in no apparent distress; mood and affect are within normal limits. A focused examination was performed of the following areas:face,arms,neck Relevant exam findings are noted in the Assessment and Plan.  left shoulder x 1 Stuck-on, waxy, tan-brown papules --Discussed benign etiology and prognosis.   scalp, right cheek x 16 (16) Erythematous thin papules/macules with gritty scale.    Assessment & Plan   Inflamed seborrheic keratosis left shoulder x 1  Symptomatic, irritating, patient would like treated.   Destruction of lesion - left shoulder x 1 Complexity: simple   Destruction method: cryotherapy   Informed consent: discussed and consent obtained   Timeout:  patient name, date of birth, surgical site, and procedure verified Lesion destroyed using liquid nitrogen: Yes   Region frozen until ice ball extended beyond lesion: Yes   Outcome: patient tolerated procedure well with no complications   Post-procedure details: wound care instructions given    AK (actinic keratosis) (16) scalp, right cheek x 16  Actinic keratoses are precancerous spots that appear secondary to cumulative UV radiation exposure/sun exposure over time. They are chronic with expected duration over 1 year. A portion of actinic keratoses will progress to squamous cell carcinoma of the skin. It is not possible to reliably predict which spots will progress to skin  cancer and so treatment is recommended to prevent development of skin cancer.  Recommend daily broad spectrum sunscreen SPF 30+ to sun-exposed areas, reapply every 2 hours as needed.  Recommend staying in the shade or wearing long sleeves, sun glasses (UVA+UVB protection) and wide brim hats (4-inch brim around the entire circumference of the hat). Call for new or changing lesions.   Destruction of lesion - scalp, right cheek x 16 Complexity: simple   Destruction method: cryotherapy   Informed consent: discussed and consent obtained   Timeout:  patient name, date of birth, surgical site, and procedure verified Lesion destroyed using liquid nitrogen: Yes   Region frozen until ice ball extended beyond lesion: Yes   Outcome: patient tolerated procedure well with no complications   Post-procedure details: wound care instructions given    ACTINIC DAMAGE WITH PRECANCEROUS ACTINIC KERATOSES Counseling for Topical Chemotherapy Management: Patient exhibits: - Severe, confluent actinic changes with pre-cancerous actinic keratoses that is secondary to cumulative UV radiation exposure over time - Condition that is severe; chronic, not at goal. - diffuse scaly erythematous macules and papules with underlying dyspigmentation - Discussed Prescription "Field Treatment" topical Chemotherapy for Severe, Chronic Confluent Actinic Changes with Pre-Cancerous Actinic Keratoses Field treatment involves treatment of an entire area of skin that has confluent Actinic Changes (Sun/ Ultraviolet light damage) and PreCancerous Actinic Keratoses by method of PhotoDynamic Therapy (PDT) and/or prescription Topical Chemotherapy agents such as 5-fluorouracil, 5-fluorouracil/calcipotriene, and/or imiquimod.  The purpose is to decrease the number of clinically evident and subclinical PreCancerous lesions to prevent progression to development of skin cancer by chemically destroying early precancer changes that may  or may not be  visible.  It has been shown to reduce the risk of developing skin cancer in the treated area. As a result of treatment, redness, scaling, crusting, and open sores may occur during treatment course. One or more than one of these methods may be used and may have to be used several times to control, suppress and eliminate the PreCancerous changes. Discussed treatment course, expected reaction, and possible side effects. - Recommend daily broad spectrum sunscreen SPF 30+ to sun-exposed areas, reapply every 2 hours as needed.  - Staying in the shade or wearing long sleeves, sun glasses (UVA+UVB protection) and wide brim hats (4-inch brim around the entire circumference of the hat) are also recommended. - Call for new or changing lesions.  In 1 month begin 5FU/Calcipotriene cream apply to scalp twice a day x 7 days   Return in about 6 months (around 07/17/2023) for AKs .  IAngelique Holm, CMA, am acting as scribe for Armida Sans, MD .   Documentation: I have reviewed the above documentation for accuracy and completeness, and I agree with the above.  Armida Sans, MD

## 2023-01-27 ENCOUNTER — Encounter: Payer: Self-pay | Admitting: Dermatology

## 2023-02-25 ENCOUNTER — Telehealth: Payer: Self-pay

## 2023-02-25 NOTE — Telephone Encounter (Signed)
Darryl Novak contacted the office this afternoon and states his father used the topical chemotherapy cream and now has some scabs and crusting. The staff at [facility] wear concerned and wanted to know if there was something you could prescribe, or anything over the counter that you recommend.   Contact son [phone] [facility] will need order faxed for any medication OTC or otherwise.

## 2023-02-26 MED ORDER — HYDROCORTISONE 1 % EX CREA: 1.0000 | TOPICAL_CREAM | Freq: Two times a day (BID) | CUTANEOUS | 0 refills | Status: DC

## 2023-02-26 NOTE — Addendum Note (Signed)
Addended by: Cari Caraway A on: 02/26/2023 05:29 PM   Modules accepted: Orders

## 2023-02-26 NOTE — Telephone Encounter (Signed)
Left message on Darryl Novak's voicemail to return my call. Order faxed to [facility] [phone]

## 2023-03-01 ENCOUNTER — Observation Stay: Payer: Medicare Other

## 2023-03-01 ENCOUNTER — Emergency Department: Payer: Medicare Other

## 2023-03-01 ENCOUNTER — Observation Stay
Admission: EM | Admit: 2023-03-01 | Discharge: 2023-03-03 | Payer: Medicare Other | Attending: Internal Medicine | Admitting: Internal Medicine

## 2023-03-01 ENCOUNTER — Other Ambulatory Visit: Payer: Self-pay

## 2023-03-01 DIAGNOSIS — R197 Diarrhea, unspecified: Secondary | ICD-10-CM | POA: Insufficient documentation

## 2023-03-01 DIAGNOSIS — I2510 Atherosclerotic heart disease of native coronary artery without angina pectoris: Secondary | ICD-10-CM | POA: Diagnosis present

## 2023-03-01 DIAGNOSIS — W19XXXA Unspecified fall, initial encounter: Secondary | ICD-10-CM | POA: Diagnosis not present

## 2023-03-01 DIAGNOSIS — I129 Hypertensive chronic kidney disease with stage 1 through stage 4 chronic kidney disease, or unspecified chronic kidney disease: Secondary | ICD-10-CM | POA: Diagnosis not present

## 2023-03-01 DIAGNOSIS — Z87891 Personal history of nicotine dependence: Secondary | ICD-10-CM | POA: Diagnosis not present

## 2023-03-01 DIAGNOSIS — D696 Thrombocytopenia, unspecified: Secondary | ICD-10-CM | POA: Diagnosis present

## 2023-03-01 DIAGNOSIS — Y92009 Unspecified place in unspecified non-institutional (private) residence as the place of occurrence of the external cause: Secondary | ICD-10-CM

## 2023-03-01 DIAGNOSIS — R2689 Other abnormalities of gait and mobility: Secondary | ICD-10-CM | POA: Diagnosis not present

## 2023-03-01 DIAGNOSIS — K529 Noninfective gastroenteritis and colitis, unspecified: Secondary | ICD-10-CM | POA: Diagnosis not present

## 2023-03-01 DIAGNOSIS — R531 Weakness: Secondary | ICD-10-CM | POA: Diagnosis present

## 2023-03-01 DIAGNOSIS — Z7901 Long term (current) use of anticoagulants: Secondary | ICD-10-CM | POA: Diagnosis not present

## 2023-03-01 DIAGNOSIS — E876 Hypokalemia: Secondary | ICD-10-CM | POA: Diagnosis not present

## 2023-03-01 DIAGNOSIS — Z8673 Personal history of transient ischemic attack (TIA), and cerebral infarction without residual deficits: Secondary | ICD-10-CM | POA: Insufficient documentation

## 2023-03-01 DIAGNOSIS — N1831 Chronic kidney disease, stage 3a: Secondary | ICD-10-CM | POA: Diagnosis not present

## 2023-03-01 DIAGNOSIS — F32A Depression, unspecified: Secondary | ICD-10-CM | POA: Diagnosis present

## 2023-03-01 DIAGNOSIS — I10 Essential (primary) hypertension: Secondary | ICD-10-CM | POA: Diagnosis present

## 2023-03-01 DIAGNOSIS — M6281 Muscle weakness (generalized): Secondary | ICD-10-CM | POA: Insufficient documentation

## 2023-03-01 DIAGNOSIS — E785 Hyperlipidemia, unspecified: Secondary | ICD-10-CM | POA: Diagnosis present

## 2023-03-01 DIAGNOSIS — K5730 Diverticulosis of large intestine without perforation or abscess without bleeding: Secondary | ICD-10-CM | POA: Insufficient documentation

## 2023-03-01 DIAGNOSIS — E860 Dehydration: Secondary | ICD-10-CM

## 2023-03-01 DIAGNOSIS — Z85828 Personal history of other malignant neoplasm of skin: Secondary | ICD-10-CM | POA: Insufficient documentation

## 2023-03-01 DIAGNOSIS — I482 Chronic atrial fibrillation, unspecified: Secondary | ICD-10-CM | POA: Diagnosis present

## 2023-03-01 DIAGNOSIS — I639 Cerebral infarction, unspecified: Secondary | ICD-10-CM | POA: Diagnosis not present

## 2023-03-01 LAB — URINALYSIS, ROUTINE W REFLEX MICROSCOPIC
Bacteria, UA: NONE SEEN
Bilirubin Urine: NEGATIVE
Glucose, UA: NEGATIVE mg/dL
Hgb urine dipstick: NEGATIVE
Ketones, ur: NEGATIVE mg/dL
Leukocytes,Ua: NEGATIVE
Nitrite: NEGATIVE
Protein, ur: 30 mg/dL — AB
Specific Gravity, Urine: 1.015 (ref 1.005–1.030)
pH: 6 (ref 5.0–8.0)

## 2023-03-01 LAB — CBG MONITORING, ED: Glucose-Capillary: 110 mg/dL — ABNORMAL HIGH (ref 70–99)

## 2023-03-01 LAB — CBC
HCT: 46.2 % (ref 39.0–52.0)
Hemoglobin: 14.9 g/dL (ref 13.0–17.0)
MCH: 29 pg (ref 26.0–34.0)
MCHC: 32.3 g/dL (ref 30.0–36.0)
MCV: 90.1 fL (ref 80.0–100.0)
Platelets: 129 10*3/uL — ABNORMAL LOW (ref 150–400)
RBC: 5.13 MIL/uL (ref 4.22–5.81)
RDW: 16.1 % — ABNORMAL HIGH (ref 11.5–15.5)
WBC: 8.9 10*3/uL (ref 4.0–10.5)
nRBC: 0 % (ref 0.0–0.2)

## 2023-03-01 LAB — BASIC METABOLIC PANEL
Anion gap: 10 (ref 5–15)
BUN: 15 mg/dL (ref 8–23)
CO2: 25 mmol/L (ref 22–32)
Calcium: 8.8 mg/dL — ABNORMAL LOW (ref 8.9–10.3)
Chloride: 98 mmol/L (ref 98–111)
Creatinine, Ser: 1.31 mg/dL — ABNORMAL HIGH (ref 0.61–1.24)
GFR, Estimated: 50 mL/min — ABNORMAL LOW (ref >60)
Glucose, Bld: 145 mg/dL — ABNORMAL HIGH (ref 70–99)
Potassium: 3.5 mmol/L (ref 3.5–5.1)
Sodium: 133 mmol/L — ABNORMAL LOW (ref 135–145)

## 2023-03-01 LAB — HEPATIC FUNCTION PANEL
ALT: 11 U/L (ref 0–44)
AST: 40 U/L (ref 15–41)
Albumin: 3.8 g/dL (ref 3.5–5.0)
Alkaline Phosphatase: 59 U/L (ref 38–126)
Bilirubin, Direct: 0.4 mg/dL — ABNORMAL HIGH (ref 0.0–0.2)
Indirect Bilirubin: 0.8 mg/dL (ref 0.3–0.9)
Total Bilirubin: 1.2 mg/dL (ref 0.3–1.2)
Total Protein: 7.7 g/dL (ref 6.5–8.1)

## 2023-03-01 MED ORDER — GABAPENTIN 300 MG PO CAPS
600.0000 mg | ORAL_CAPSULE | Freq: Two times a day (BID) | ORAL | Status: DC
  Administered 2023-03-01 – 2023-03-03 (×4): 600 mg via ORAL
  Filled 2023-03-01 (×4): qty 2

## 2023-03-01 MED ORDER — HYDRALAZINE HCL 20 MG/ML IJ SOLN
5.0000 mg | INTRAMUSCULAR | Status: DC | PRN
  Administered 2023-03-01: 5 mg via INTRAVENOUS
  Filled 2023-03-01 (×2): qty 1

## 2023-03-01 MED ORDER — ADULT MULTIVITAMIN W/MINERALS CH
1.0000 | ORAL_TABLET | Freq: Every day | ORAL | Status: DC
  Administered 2023-03-02 – 2023-03-03 (×2): 1 via ORAL
  Filled 2023-03-01 (×2): qty 1

## 2023-03-01 MED ORDER — METOPROLOL TARTRATE 5 MG/5ML IV SOLN: 5.0000 mg | INTRAVENOUS | Status: DC | PRN

## 2023-03-01 MED ORDER — CHOLECALCIFEROL 10 MCG/ML (400 UNIT/ML) PO LIQD
400.0000 [IU] | Freq: Every day | ORAL | Status: DC
  Administered 2023-03-02 – 2023-03-03 (×2): 400 [IU] via ORAL
  Filled 2023-03-01 (×2): qty 1

## 2023-03-01 MED ORDER — SIMVASTATIN 40 MG PO TABS
40.0000 mg | ORAL_TABLET | Freq: Every day | ORAL | Status: DC
  Administered 2023-03-02 – 2023-03-03 (×2): 40 mg via ORAL
  Filled 2023-03-01: qty 2
  Filled 2023-03-01: qty 1
  Filled 2023-03-01: qty 2
  Filled 2023-03-01: qty 1

## 2023-03-01 MED ORDER — ALPHA-LIPOIC ACID 100 MG PO CAPS: 100.0000 mg | ORAL_CAPSULE | Freq: Every day | ORAL | Status: DC

## 2023-03-01 MED ORDER — CO Q-10 100 MG PO CAPS: 100.0000 mg | ORAL_CAPSULE | Freq: Every day | ORAL | Status: DC

## 2023-03-01 MED ORDER — PIPERACILLIN-TAZOBACTAM 3.375 G IVPB
3.3750 g | Freq: Three times a day (TID) | INTRAVENOUS | Status: DC
  Administered 2023-03-01 – 2023-03-02 (×2): 3.375 g via INTRAVENOUS
  Filled 2023-03-01 (×2): qty 50

## 2023-03-01 MED ORDER — METRONIDAZOLE 500 MG/100ML IV SOLN
500.0000 mg | Freq: Once | INTRAVENOUS | Status: AC
  Administered 2023-03-01: 500 mg via INTRAVENOUS
  Filled 2023-03-01: qty 100

## 2023-03-01 MED ORDER — ONDANSETRON HCL 4 MG/2ML IJ SOLN: 4.0000 mg | Freq: Three times a day (TID) | INTRAMUSCULAR | Status: DC | PRN

## 2023-03-01 MED ORDER — ALBUTEROL SULFATE (2.5 MG/3ML) 0.083% IN NEBU: 2.5000 mg | INHALATION_SOLUTION | Freq: Four times a day (QID) | RESPIRATORY_TRACT | Status: DC | PRN

## 2023-03-01 MED ORDER — POLYVINYL ALCOHOL 1.4 % OP SOLN
1.0000 [drp] | Freq: Every day | OPHTHALMIC | Status: DC
  Administered 2023-03-02 – 2023-03-03 (×2): 1 [drp] via OPHTHALMIC
  Filled 2023-03-01 (×2): qty 15

## 2023-03-01 MED ORDER — ALBUTEROL SULFATE (2.5 MG/3ML) 0.083% IN NEBU: 2.5000 mg | INHALATION_SOLUTION | RESPIRATORY_TRACT | Status: DC | PRN

## 2023-03-01 MED ORDER — LORATADINE 10 MG PO TABS
10.0000 mg | ORAL_TABLET | Freq: Every day | ORAL | Status: DC
  Administered 2023-03-02 – 2023-03-03 (×2): 10 mg via ORAL
  Filled 2023-03-01 (×2): qty 1

## 2023-03-01 MED ORDER — SODIUM CHLORIDE 0.9 % IV BOLUS
1000.0000 mL | Freq: Once | INTRAVENOUS | Status: AC
  Administered 2023-03-01: 1000 mL via INTRAVENOUS

## 2023-03-01 MED ORDER — MECLIZINE HCL 25 MG PO TABS: 25.0000 mg | ORAL_TABLET | Freq: Three times a day (TID) | ORAL | Status: DC | PRN

## 2023-03-01 MED ORDER — ACETAMINOPHEN 325 MG PO TABS: 650.0000 mg | ORAL_TABLET | Freq: Four times a day (QID) | ORAL | Status: DC | PRN

## 2023-03-01 MED ORDER — IOHEXOL 300 MG/ML  SOLN
100.0000 mL | Freq: Once | INTRAMUSCULAR | Status: AC | PRN
  Administered 2023-03-01: 100 mL via INTRAVENOUS

## 2023-03-01 MED ORDER — OXYCODONE-ACETAMINOPHEN 5-325 MG PO TABS: 1.0000 | ORAL_TABLET | ORAL | Status: DC | PRN

## 2023-03-01 MED ORDER — TRAZODONE HCL 100 MG PO TABS
100.0000 mg | ORAL_TABLET | Freq: Every day | ORAL | Status: DC
  Administered 2023-03-01 – 2023-03-02 (×2): 100 mg via ORAL
  Filled 2023-03-01 (×2): qty 1

## 2023-03-01 MED ORDER — APIXABAN 5 MG PO TABS
5.0000 mg | ORAL_TABLET | Freq: Two times a day (BID) | ORAL | Status: DC
  Administered 2023-03-01 – 2023-03-03 (×4): 5 mg via ORAL
  Filled 2023-03-01 (×4): qty 1

## 2023-03-01 MED ORDER — BIOTIN 5000 MCG PO CAPS: 5000.0000 ug | ORAL_CAPSULE | Freq: Every day | ORAL | Status: DC

## 2023-03-01 NOTE — ED Triage Notes (Signed)
Pt presents via EMS from [facility] c/o fall yesterday. EMS reports left leg and left elbow. Pt remembers fall. Was not seen yesterday after fall. Son wanted pt evaluated due to dizziness. Pt denies dizziness at this time.

## 2023-03-01 NOTE — ED Provider Notes (Signed)
[facility] Provider Note   Event Date/Time   First MD Initiated Contact with Patient 03/01/23 1502     (approximate) History  Fall and Dizziness  HPI Darryl Novak is a 89 y.o. male with past medical history of hard of hearing and partial blindness who presents from home Place of [place] via EMS due to significant weakness.  Patient reportedly does "100 squats a day" but over the last 3 days has been unable to get himself out of her recliner without assistance.  Patient is hard of hearing and  stating "am I ready to go home".  Patient denies any complaints at this time.  Daughter states patient has had diarrhea today ROS: Patient currently denies any vision changes, tinnitus, difficulty speaking, facial droop, sore throat, chest pain, shortness of breath, abdominal pain, nausea/vomiting/diarrhea, dysuria, or weakness/numbness/paresthesias in any extremity   Physical Exam  Triage Vital Signs: ED Triage Vitals  Enc Vitals Group     BP 03/01/23 1329 (!) 157/67     Pulse Rate 03/01/23 1329 86     Resp 03/01/23 1329 20     Temp 03/01/23 1329 97.8 F (36.6 C)     Temp src --      SpO2 03/01/23 1329 95 %     Weight 03/01/23 1330 160 lb (72.6 kg)     Height 03/01/23 1330 5\' 8"  (1.727 m)     Head Circumference --      Peak Flow --      Pain Score 03/01/23 1330 0     Pain Loc --      Pain Edu? --      Excl. in GC? --    Most recent vital signs: Vitals:   03/02/23 0507 03/02/23 0932  BP: (!) 123/49 128/64  Pulse: 71 72  Resp:  18  Temp:  98 F (36.7 C)  SpO2:  92%   General: Awake, cooperative CV:  Good peripheral perfusion.  Resp:  Normal effort.  Abd:  No distention.  Other:  Elderly well-developed, well-nourished Caucasian male laying in bed in no acute distress.  Patient is partially blind and hard of hearing. ED Results / Procedures / Treatments  Labs (all labs ordered are listed, but only abnormal results are displayed) Labs Reviewed   BASIC METABOLIC PANEL - Abnormal; Notable for the following components:      Result Value   Sodium 133 (*)    Glucose, Bld 145 (*)    Creatinine, Ser 1.31 (*)    Calcium 8.8 (*)    GFR, Estimated 50 (*)    All other components within normal limits  CBC - Abnormal; Notable for the following components:   RDW 16.1 (*)    Platelets 129 (*)    All other components within normal limits  URINALYSIS, ROUTINE W REFLEX MICROSCOPIC - Abnormal; Notable for the following components:   Color, Urine YELLOW (*)    APPearance CLEAR (*)    Protein, ur 30 (*)    All other components within normal limits  HEPATIC FUNCTION PANEL - Abnormal; Notable for the following components:   Bilirubin, Direct 0.4 (*)    All other components within normal limits  BASIC METABOLIC PANEL - Abnormal; Notable for the following components:   Sodium 132 (*)    Potassium 3.2 (*)    Calcium 8.1 (*)    All other components within normal limits  CBC - Abnormal; Notable for the following components:   RDW 15.9 (*)  Platelets 128 (*)    All other components within normal limits  CBG MONITORING, ED - Abnormal; Notable for the following components:   Glucose-Capillary 110 (*)    All other components within normal limits  C DIFFICILE QUICK SCREEN W PCR REFLEX    GASTROINTESTINAL PANEL BY PCR, STOOL (REPLACES STOOL CULTURE)   EKG ED ECG REPORT I, Merwyn Katos, the attending physician, personally viewed and interpreted this ECG. Date: 03/01/2023 EKG Time: 1333 Rate: 85 Rhythm: Junctional rhythm QRS Axis: normal Intervals: normal ST/T Wave abnormalities: normal Narrative Interpretation: no evidence of acute ischemia RADIOLOGY ED MD interpretation: CT of the head without contrast interpreted by me shows no evidence of acute abnormalities including no intracerebral hemorrhage, obvious masses, or significant edema -Agree with radiology assessment Official radiology report(s): DG Elbow 2 Views Left  Result Date:  03/01/2023 CLINICAL DATA:  Wall EXAM: LEFT ELBOW - 2 VIEW COMPARISON:  None Available. FINDINGS: No evidence of fracture or dislocation. Small joint effusion. Mild degenerative changes of the left elbow. Vascular calcifications. Soft tissues are unremarkable. IMPRESSION: 1. No evidence of fracture or dislocation. 2. Small elbow joint effusion, findings can be seen in the setting of occult fracture. In this age group, radial head fracture is most common. Electronically Signed   By: Allegra Lai M.D.   On: 03/01/2023 19:32   DG Tibia/Fibula Right  Result Date: 03/01/2023 CLINICAL DATA:  Fall yesterday. EXAM: RIGHT TIBIA AND FIBULA - 2 VIEW COMPARISON:  None Available. FINDINGS: There is no evidence of fracture or other focal bone lesions. Knee and ankle alignment are maintained. Advanced arterial vascular calcifications. IMPRESSION: 1. No fracture of the right lower leg. 2. Advanced arterial vascular calcifications. Electronically Signed   By: Narda Rutherford M.D.   On: 03/01/2023 19:30   CT ABDOMEN PELVIS W CONTRAST  Result Date: 03/01/2023 CLINICAL DATA:  Pain left lower quadrant of abdomen EXAM: CT ABDOMEN AND PELVIS WITH CONTRAST TECHNIQUE: Multidetector CT imaging of the abdomen and pelvis was performed using the standard protocol following bolus administration of intravenous contrast. RADIATION DOSE REDUCTION: This exam was performed according to the departmental dose-optimization program which includes automated exposure control, adjustment of the mA and/or kV according to patient size and/or use of iterative reconstruction technique. CONTRAST:  OMNIPAQUE IOHEXOL 300 MG/ML  SOLN COMPARISON:  None Available. FINDINGS: Lower chest: Increased interstitial markings are seen in posterior lower lung fields. Heart is enlarged in size. Coronary artery calcifications are seen. Atherosclerotic changes are noted in aorta. Hepatobiliary: Small high density foci are seen in dependent portion of gallbladder  suggesting tiny stones. There is mild nodularity of the liver surface. There is no dilation of bile ducts. Pancreas: There is atrophy.  No focal abnormalities are seen. Spleen: Unremarkable. Adrenals/Urinary Tract: Adrenals are unremarkable. There is no hydronephrosis. There are no renal or ureteral stones. Urinary bladder is unremarkable. Stomach/Bowel: Stomach is not distended. Small bowel loops are not dilated. Appendix is not distinctly seen. There is no pericecal stranding. There is mild mucosal enhancement in right colon. There is no significant wall thickening in colon. There is no pericolic stranding. Vascular/Lymphatic: There are scattered arterial calcifications. Reproductive: Prostate is enlarged. There is balloon with calcification in the margins behind the rectus muscles in suprapubic region, possibly device used for management of erectile dysfunction. Other: There is no ascites or pneumoperitoneum. Left inguinal hernia containing fat is seen. Small right inguinal hernia containing fat is seen. Umbilical hernia containing fat is seen. Musculoskeletal: Degenerative changes are  noted in thoracic and lumbar spine. There is moderate decrease in height of body of L5 vertebra, possibly old compression. IMPRESSION: There is no evidence of intestinal obstruction or pneumoperitoneum. There is no hydronephrosis. Cardiomegaly. Coronary artery disease. Increased interstitial markings in the lower lung fields may suggest scarring or interstitial pneumonia. Gallbladder stones. There is mild nodularity of the liver surface suggesting possible cirrhosis. There is mild mucosal enhancement in right colon which may be due to incomplete distention or nonspecific colitis. Scattered diverticula are seen in colon without signs of focal acute diverticulitis. Large left inguinal hernia containing fat. Small umbilical and small right inguinal hernia containing fat. Enlarged prostate. Decrease in height of body of L5 vertebra may  suggest old compression. Electronically Signed   By: Ernie Avena M.D.   On: 03/01/2023 16:55   PROCEDURES: Critical Care performed: Yes, see critical care procedure note(s) .1-3 Lead EKG Interpretation  Performed by: Merwyn Katos, MD Authorized by: Merwyn Katos, MD     Interpretation: normal     ECG rate:  71   ECG rate assessment: normal     Rhythm: sinus rhythm     Ectopy: none     Conduction: normal   CRITICAL CARE Performed by: Merwyn Katos  Total critical care time: 33 minutes  Critical care time was exclusive of separately billable procedures and treating other patients.  Critical care was necessary to treat or prevent imminent or life-threatening deterioration.  Critical care was time spent personally by me on the following activities: development of treatment plan with patient and/or surrogate as well as nursing, discussions with consultants, evaluation of patient's response to treatment, examination of patient, obtaining history from patient or surrogate, ordering and performing treatments and interventions, ordering and review of laboratory studies, ordering and review of radiographic studies, pulse oximetry and re-evaluation of patient's condition.  MEDICATIONS ORDERED IN ED: Medications  ondansetron (ZOFRAN) injection 4 mg (has no administration in time range)  hydrALAZINE (APRESOLINE) injection 5 mg (5 mg Intravenous Given 03/01/23 2208)  acetaminophen (TYLENOL) tablet 650 mg (has no administration in time range)  albuterol (PROVENTIL) (2.5 MG/3ML) 0.083% nebulizer solution 2.5 mg (has no administration in time range)  metoprolol tartrate (LOPRESSOR) injection 5 mg (has no administration in time range)  oxyCODONE-acetaminophen (PERCOCET/ROXICET) 5-325 MG per tablet 1 tablet (has no administration in time range)  simvastatin (ZOCOR) tablet 40 mg (40 mg Oral Given 03/02/23 1010)  traZODone (DESYREL) tablet 100 mg (100 mg Oral Given 03/01/23 2213)  meclizine  (ANTIVERT) tablet 25 mg (has no administration in time range)  apixaban (ELIQUIS) tablet 5 mg (5 mg Oral Given 03/02/23 1010)  gabapentin (NEURONTIN) capsule 600 mg (600 mg Oral Given 03/02/23 1010)  cholecalciferol (VITAMIN D3) 10 MCG/ML oral liquid 400 Units (400 Units Oral Given 03/02/23 1010)  multivitamin with minerals tablet 1 tablet (1 tablet Oral Given 03/02/23 1010)  albuterol (PROVENTIL) (2.5 MG/3ML) 0.083% nebulizer solution 2.5 mg (has no administration in time range)  loratadine (CLARITIN) tablet 10 mg (10 mg Oral Given 03/02/23 1010)  polyvinyl alcohol (LIQUIFILM TEARS) 1.4 % ophthalmic solution 1 drop (has no administration in time range)  sodium chloride 0.9 % bolus 1,000 mL (0 mLs Intravenous Stopped 03/01/23 1934)  iohexol (OMNIPAQUE) 300 MG/ML solution 100 mL (100 mLs Intravenous Contrast Given 03/01/23 1608)  metroNIDAZOLE (FLAGYL) IVPB 500 mg (0 mg Intravenous Stopped 03/01/23 1951)  sodium chloride 0.9 % bolus 1,000 mL (0 mLs Intravenous Stopped 03/01/23 2058)  potassium chloride SA (KLOR-CON M)  CR tablet 40 mEq (40 mEq Oral Given 03/02/23 0725)   IMPRESSION / MDM / ASSESSMENT AND PLAN / ED COURSE  I reviewed the triage vital signs and the nursing notes.                             The patient is on the cardiac monitor to evaluate for evidence of arrhythmia and/or significant heart rate changes. Patient's presentation is most consistent with acute presentation with potential threat to life or bodily function.  This patient presents to the ED for concern of generalized weakness with diarrhea, this involves an extensive number of treatment options, and is a complaint that carries with it a high risk of complications and morbidity.  The differential diagnosis includes diverticulitis, appendicitis, sepsis, electrolyte abnormality Co morbidities that complicate the patient evaluation  Partial blindness, hard of hearing Additional history obtained:  Additional history obtained from daughter at  bedside  External records from outside source obtained and reviewed including PCP visit on 02/10/2023 Lab Tests:  I Ordered, and personally interpreted labs.  The pertinent results include:  Potassium 3.2, platelets 128 Imaging Studies ordered:  I ordered imaging studies including CT of the abdomen and pelvis  I independently visualized and interpreted imaging which showed colitis  I agree with the radiologist interpretation Cardiac Monitoring: / EKG:  The patient was maintained on a cardiac monitor.  I personally viewed and interpreted the cardiac monitored which showed an underlying rhythm of: Junctional rhythm Consultations Obtained:  I requested consultation with the hospitalist,  and discussed lab and imaging findings as well as pertinent plan - they recommend: Admission Problem List / ED Course / Critical interventions / Medication management  Colitis, generalized weakness, dehydration  I ordered medication including IV fluids, antibiotics for dehydration and colitis  Reevaluation of the patient after these medicines showed that the patient improved  I have reviewed the patients home medicines and have made adjustments as needed Dispo: Admit to medicine       FINAL CLINICAL IMPRESSION(S) / ED DIAGNOSES   Final diagnoses:  Colitis  Dehydration  Diarrhea of presumed infectious origin  Generalized weakness   Rx / DC Orders   ED Discharge Orders     None      Note:  This document was prepared using Dragon voice recognition software and may include unintentional dictation errors.   Merwyn Katos, MD 03/02/23 [phone]

## 2023-03-01 NOTE — Progress Notes (Signed)
PHARMACIST - PHYSICIAN ORDER COMMUNICATION  CONCERNING: P&T Medication Policy on Herbal Medications  DESCRIPTION:  This patient's order for:  Biotin, Alpha Lipoic Acid, and Co-Q 10  has been noted.  This product(s) is classified as an "herbal" or natural product. Due to a lack of definitive safety studies or FDA approval, nonstandard manufacturing practices, plus the potential risk of unknown drug-drug interactions while on inpatient medications, the Pharmacy and Therapeutics Committee does not permit the use of "herbal" or natural products of this type within [facility].   ACTION TAKEN: The pharmacy department is unable to verify this order at this time. Please reevaluate patient's clinical condition at discharge and address if the herbal or natural product(s) should be resumed at that time.  Clovia Cuff, PharmD, BCPS 03/01/2023 8:53 PM

## 2023-03-01 NOTE — Progress Notes (Signed)
Pharmacy Antibiotic Note  CARNELL CANTERBERRY is a 89 y.o. male admitted on 03/01/2023 with  colitis .  Pharmacy has been consulted for Zosyn dosing.  Plan: Zosyn 3.375g IV q8h (4 hour infusion).  Height: 5\' 8"  (172.7 cm) Weight: 72.6 kg (160 lb) IBW/kg (Calculated) : 68.4  Temp (24hrs), Avg:97.8 F (36.6 C), Min:97.8 F (36.6 C), Max:97.8 F (36.6 C)  Recent Labs  Lab 03/01/23 1335  WBC 8.9  CREATININE 1.31*    Estimated Creatinine Clearance: 32.6 mL/min (A) (by C-G formula based on SCr of 1.31 mg/dL (H)).    Allergies  Allergen Reactions   Ace Inhibitors Other (See Comments)    Intolerant    Antimicrobials this admission: Zosyn 6/1 >>     Dose adjustments this admission:  Microbiology results:  Thank you for allowing pharmacy to be a part of this patient's care.  Clovia Cuff, PharmD, BCPS 03/01/2023 6:47 PM

## 2023-03-01 NOTE — ED Notes (Signed)
Request made for transport to the floor ?

## 2023-03-01 NOTE — H&P (Signed)
History and Physical    Darryl Novak ZOX:[id number] DOB: August 22, 1926 DOA: 03/01/2023  Referring MD/NP/PA:   PCP: Darryl Nurse, MD   Patient coming from:  The patient is coming from SNF   Chief Complaint: Diarrhea, weakness, fall  HPI: Darryl Novak is a 89 y.o. 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  Per her daughter (I called her daughter by phone), patient has significant generalized weakness for several days. Patient reportedly does "100 squats a day" but over the last 3 days has been unable to get himself out of her recliner without assistance.  He fell yesterday after taking shower, no loss of consciousness.  He has pain in left elbow and right shin area.  He developed diarrhea today, with several episodes of watery diarrhea.  Denies nausea, vomiting or abdominal pain.  No fever or chills.  Denies symptoms of UTI.  No unilateral numbness or tinglings in extremities.  No facial droop or slurred speech.  Data reviewed independently and ED Course: pt was found to have WBC 8.9, renal function close to baseline, temperature normal, blood pressure 117/98, heart rate of 101, RR 20, oxygen saturation 95% on room air.  CT of head is negative for acute intra cranial abnormalities.  CT of abdomen/pelvis showed possible  mild colitis.  Patient is placed on head of bed for observation.  CT of abdomen/pelvis: There is no evidence of intestinal obstruction or pneumoperitoneum. There is no hydronephrosis.   Cardiomegaly. Coronary artery disease. Increased interstitial markings in the lower lung fields may suggest scarring or interstitial pneumonia.   Gallbladder stones. There is mild nodularity of the liver surface suggesting possible cirrhosis.   There is mild mucosal enhancement in  right colon which may be due to incomplete distention or nonspecific colitis. Scattered diverticula are seen in colon without signs of focal acute diverticulitis.   Large left inguinal hernia containing fat. Small umbilical and small right inguinal hernia containing fat. Enlarged prostate. Decrease in height of body of L5 vertebra may suggest old compression.    EKG: I have personally reviewed.  Seem to be atrial fibrillation, QTc 449, LAD, poor R wave progression  Review of Systems:   General: no fevers, chills, no body weight gain, has fatigue HEENT: Has blindness bilaterally, has hard of hearing, no sore throat Respiratory: no dyspnea, coughing, wheezing CV: no chest pain, no palpitations GI: no nausea, vomiting, abdominal pain, has diarrhea, no constipation GU: no dysuria, burning on urination, increased urinary frequency, hematuria  Ext: no leg edema Neuro: no unilateral weakness, numbness, or tingling. Has fall Skin: no rash MSK: No muscle spasm, no deformity, no limitation of range of movement in spin. Has pain in left elbow and right shin Heme: No easy bruising.  Travel history: No recent long distant travel.   Allergy:  Allergies  Allergen Reactions   Ace Inhibitors Other (See Comments)    Intolerant    Past Medical History:  Diagnosis Date   Heart trouble    Hypertension    MI (myocardial infarction) (HCC) 1978   Neuropathy    Shingles    Squamous cell carcinoma of skin 09/05/2010   Left lat. neck infra-auricular. WD SCC. EDC   Squamous cell carcinoma of skin 08/18/2013   Right medial distal dorsum forearm. KA pattern. EDC   Squamous cell carcinoma of skin 10/03/2016  Left inferior sideburn. SCCis, hypertrophic. EDC 01/30/2017   Squamous cell carcinoma of skin 04/08/2018   Left forearm. WD SCC. Excised 05/06/2018, margins free.   Squamous cell carcinoma of skin 04/08/2018   Right upper arm. SCCis, hypertrophic.   Squamous cell carcinoma of skin 02/17/2019    Mid vertex scalp. SCCis. EDC.   Squamous cell carcinoma of skin 05/28/2021   left forearm - EDC   Squamous cell carcinoma of skin 10/31/2022   R eyebrow - SCC, ED&C   Stroke (HCC)    Ulcer     Past Surgical History:  Procedure Laterality Date   Carotid artery endarterectomy      Social History:  reports that he has quit smoking. His smoking use included cigarettes. He has never used smokeless tobacco. He reports current alcohol use. He reports that he does not use drugs.  Family History:  Family History  Problem Relation Age of Onset   Heart attack Father      Prior to Admission medications   Medication Sig Start Date End Date Taking? Authorizing Provider  acetaminophen (TYLENOL) 325 MG tablet Take 325 mg by mouth every 6 (six) hours as needed for mild pain or moderate pain.    [provider]  albuterol (VENTOLIN HFA) 108 (90 Base) MCG/ACT inhaler Inhale 2 puffs into the lungs every 6 (six) hours as needed for wheezing or shortness of breath.    [provider]  Alpha-Lipoic Acid 100 MG CAPS Take 100 mg by mouth daily.    [provider]  apixaban (ELIQUIS) 5 MG TABS tablet Take 5 mg by mouth 2 (two) times daily.     [provider]  Biotin 5 MG CAPS Take 1 capsule by mouth daily. 10/29/21   [provider]  Biotin 5000 MCG CAPS Take 5,000 mcg by mouth daily.    [provider]  carboxymethylcellulose (REFRESH PLUS) 0.5 % SOLN Place 1 drop into both eyes daily.    [provider]  cetirizine (ZYRTEC) 10 MG tablet Take 10 mg by mouth daily.    [provider]  cholecalciferol (VITAMIN D) 400 units TABS tablet Take 400 Units by mouth daily.    [provider]  Cholecalciferol (VITAMIN D3) 10 MCG (400 UNIT) tablet Take 1 tablet by mouth daily. 10/29/21   [provider]  Coenzyme Q10 (CO Q-10) 100 MG CAPS Take 100 mg by mouth daily.     [provider]  diphenhydrAMINE (BENADRYL) 25 mg  capsule Take 50 mg by mouth at bedtime as needed for itching.    [provider]  fluorouracil (EFUDEX) 5 % cream Apply topically 2 (two) times daily. Apply 7 days to scalp 11/26/21   Deirdre Evener, MD  fluorouracil (EFUDEX) 5 % cream Apply to scalp twice a day for 7 days 01/15/23   Deirdre Evener, MD  furosemide (LASIX) 20 MG tablet Take 20 mg by mouth daily.    [provider]  gabapentin (NEURONTIN) 600 MG tablet Take 600 mg by mouth 2 (two) times daily.    [provider]  hydrocortisone cream 1 % Apply 1 Application topically 2 (two) times daily. Apply to irritated areas where topical chemotherapy cream was used QD-BID PRN. 02/26/23   Deirdre Evener, MD  loperamide (IMODIUM) 2 MG capsule Take 2 mg by mouth every 4 (four) hours as needed for diarrhea or loose stools.    [provider]  meclizine (ANTIVERT) 25 MG tablet Take 25 mg by  mouth every 8 (eight) hours as needed for dizziness.    [provider]  Multiple Vitamin (MULTIVITAMIN) tablet Take 1 tablet by mouth daily.    [provider]  potassium chloride (KLOR-CON) 10 MEQ tablet Take 10 mEq by mouth daily.    [provider]  prednisoLONE acetate (PRED FORTE) 1 % ophthalmic suspension Place 1 drop into the left eye 2 (two) times daily.    [provider]  sertraline (ZOLOFT) 25 MG tablet Take 25 mg by mouth daily.  03/02/18 06/27/20  [provider]  simvastatin (ZOCOR) 40 MG tablet Take 40 mg by mouth daily.    [provider]  telmisartan (MICARDIS) 40 MG tablet Take 40 mg by mouth daily.    [provider]  traZODone (DESYREL) 100 MG tablet Take 100 mg by mouth at bedtime.    [provider]    Physical Exam: Vitals:   03/01/23 1329 03/01/23 1330 03/01/23 1700  BP: (!) 157/67  (!) 117/98  Pulse: 86  (!) 101  Resp: 20  20  Temp: 97.8 F (36.6 C)    SpO2: 95%  95%  Weight:  72.6 kg   Height:  5\' 8"  (1.727 m)     General: Not in acute distress HEENT:       Eyes: no jaundice       ENT: No discharge from the ears and nose, no pharynx injection, no tonsillar enlargement.        Neck: No JVD, no bruit, no mass felt. Heme: No neck lymph node enlargement. Cardiac: S1/S2, RRR, No murmurs, No gallops or rubs. Respiratory: No rales, wheezing, rhonchi or rubs. GI: Soft, nondistended, nontender, no rebound pain, no organomegaly, BS present. GU: No hematuria Ext: No pitting leg edema bilaterally. 1+DP/PT pulse bilaterally. Musculoskeletal: has tenderness in left elbow and right shin Skin: No rashes.  Neuro: Alert, oriented X3, has HOH,  has bilateral blindness, moves all extremities. Psych: Patient is not psychotic, no suicidal or hemocidal ideation.  Labs on Admission: I have personally reviewed following labs and imaging studies  CBC: Recent Labs  Lab 03/01/23 1335  WBC 8.9  HGB 14.9  HCT 46.2  MCV 90.1  PLT 129*   Basic Metabolic Panel: Recent Labs  Lab 03/01/23 1335  NA 133*  K 3.5  CL 98  CO2 25  GLUCOSE 145*  BUN 15  CREATININE 1.31*  CALCIUM 8.8*   GFR: Estimated Creatinine Clearance: 32.6 mL/min (A) (by C-G formula based on SCr of 1.31 mg/dL (H)). Liver Function Tests: Recent Labs  Lab 03/01/23 1542  AST 40  ALT 11  ALKPHOS 59  BILITOT 1.2  PROT 7.7  ALBUMIN 3.8   No results for input(s): "LIPASE", "AMYLASE" in the last 168 hours. No results for input(s): "AMMONIA" in the last 168 hours. Coagulation Profile: No results for input(s): "INR", "PROTIME" in the last 168 hours. Cardiac Enzymes: No results for input(s): "CKTOTAL", "CKMB", "CKMBINDEX", "TROPONINI" in the last 168 hours. BNP (last 3 results) No results for input(s): "PROBNP" in the last 8760 hours. HbA1C: No results for input(s): "HGBA1C" in the last 72 hours. CBG: No results for input(s): "GLUCAP" in the last 168 hours. Lipid Profile: No results for input(s): "CHOL", "HDL", "LDLCALC", "TRIG",  "CHOLHDL", "LDLDIRECT" in the last 72 hours. Thyroid Function Tests: No results for input(s): "TSH", "T4TOTAL", "FREET4", "T3FREE", "THYROIDAB" in the last 72 hours. Anemia Panel: No results for input(s): "VITAMINB12", "FOLATE", "FERRITIN", "TIBC", "IRON", "RETICCTPCT" in the last 72 hours.  Urine analysis:    Component Value Date/Time   COLORURINE YELLOW (A) 03/01/2023 1333   APPEARANCEUR CLEAR (A) 03/01/2023 1333   LABSPEC 1.015 03/01/2023 1333   PHURINE 6.0 03/01/2023 1333   GLUCOSEU NEGATIVE 03/01/2023 1333   HGBUR NEGATIVE 03/01/2023 1333   BILIRUBINUR NEGATIVE 03/01/2023 1333   KETONESUR NEGATIVE 03/01/2023 1333   PROTEINUR 30 (A) 03/01/2023 1333   NITRITE NEGATIVE 03/01/2023 1333   LEUKOCYTESUR NEGATIVE 03/01/2023 1333   Sepsis Labs: @LABRCNTIP (procalcitonin:4,lacticidven:4) )No results found for this or any previous visit (from the past 240 hour(s)).   Radiological Exams on Admission: CT ABDOMEN PELVIS W CONTRAST  Result Date: 03/01/2023 CLINICAL DATA:  Pain left lower quadrant of abdomen EXAM: CT ABDOMEN AND PELVIS WITH CONTRAST TECHNIQUE: Multidetector CT imaging of the abdomen and pelvis was performed using the standard protocol following bolus administration of intravenous contrast. RADIATION DOSE REDUCTION: This exam was performed according to the departmental dose-optimization program which includes automated exposure control, adjustment of the mA and/or kV according to patient size and/or use of iterative reconstruction technique. CONTRAST:  OMNIPAQUE IOHEXOL 300 MG/ML  SOLN COMPARISON:  None Available. FINDINGS: Lower chest: Increased interstitial markings are seen in posterior lower lung fields. Heart is enlarged in size. Coronary artery calcifications are seen. Atherosclerotic changes are noted in aorta. Hepatobiliary: Small high density foci are seen in dependent portion of gallbladder suggesting tiny stones. There is mild nodularity of the liver surface. There is no  dilation of bile ducts. Pancreas: There is atrophy.  No focal abnormalities are seen. Spleen: Unremarkable. Adrenals/Urinary Tract: Adrenals are unremarkable. There is no hydronephrosis. There are no renal or ureteral stones. Urinary bladder is unremarkable. Stomach/Bowel: Stomach is not distended. Small bowel loops are not dilated. Appendix is not distinctly seen. There is no pericecal stranding. There is mild mucosal enhancement in right colon. There is no significant wall thickening in colon. There is no pericolic stranding. Vascular/Lymphatic: There are scattered arterial calcifications. Reproductive: Prostate is enlarged. There is balloon with calcification in the margins behind the rectus muscles in suprapubic region, possibly device used for management of erectile dysfunction. Other: There is no ascites or pneumoperitoneum. Left inguinal hernia containing fat is seen. Small right inguinal hernia containing fat is seen. Umbilical hernia containing fat is seen. Musculoskeletal: Degenerative changes are noted in thoracic and lumbar spine. There is moderate decrease in height of body of L5 vertebra, possibly old compression. IMPRESSION: There is no evidence of intestinal obstruction or pneumoperitoneum. There is no hydronephrosis. Cardiomegaly. Coronary artery disease. Increased interstitial markings in the lower lung fields may suggest scarring or interstitial pneumonia. Gallbladder stones. There is mild nodularity of the liver surface suggesting possible cirrhosis. There is mild mucosal enhancement in right colon which may be due to incomplete distention or nonspecific colitis. Scattered diverticula are seen in colon without signs of focal acute diverticulitis. Large left inguinal hernia containing fat. Small umbilical and small right inguinal hernia containing fat. Enlarged prostate. Decrease in height of body of L5 vertebra may suggest old compression. Electronically Signed   By: Ernie Avena M.D.    On: 03/01/2023 16:55   CT HEAD WO CONTRAST  Result Date: 03/01/2023 CLINICAL DATA:  Fall, head trauma. EXAM: CT HEAD WITHOUT CONTRAST TECHNIQUE: Contiguous axial images were obtained from the base of the skull through the vertex without intravenous contrast. RADIATION DOSE REDUCTION: This exam was performed according to the departmental dose-optimization program which includes automated exposure control, adjustment of the mA and/or kV according to patient size  and/or use of iterative reconstruction technique. COMPARISON:  CT head 07/10/2022.  MRI brain 06/27/2020. FINDINGS: Brain: No evidence of acute infarction, hemorrhage, hydrocephalus, extra-axial collection or mass lesion/mass effect. There is stable moderate diffuse atrophy and extensive periventricular and deep white matter hypodensity, likely chronic small vessel ischemic change. Vascular: Atherosclerotic calcifications are present within the cavernous internal carotid arteries. Skull: Normal. Negative for fracture or focal lesion. Sinuses/Orbits: No acute finding. There is a polyp or mucous retention cysts in the inferior right maxillary sinus measuring 1.9 x 0.7 cm. Other: Peripheral vascular calcifications are present. IMPRESSION: 1. No acute intracranial abnormality. 2. Stable moderate diffuse atrophy and extensive chronic small vessel ischemic change. Electronically Signed   By: Darliss Cheney M.D.   On: 03/01/2023 15:14      Assessment/Plan Principal Problem:   Acute colitis Active Problems:   Fall at home, initial encounter   HTN (hypertension)   Chronic atrial fibrillation (HCC)   Cerebrovascular accident (CVA) (HCC)   CAD (coronary artery disease)   Chronic kidney disease, stage 3a (HCC)   HLD (hyperlipidemia)   Thrombocytopenia (HCC)   Depression   Assessment and Plan:  Possible acute colitis: pt has diarrhea, CT scan showed  mild mucosal enhancement in right colon, which may be due to mild colitis.  Does not meet criteria  for sepsis (WBC 8.9, temperature normal, heart rate 101, RR 20).  -Placed on telemetry bed for position -IV Zosyn (patient received 1 dose of Flagyl in ED) -IV fluid: 2 L normal saline -As needed Zofran for nausea -Check C. difficile and GI pathogen panel  Fall at home, initial encounter: CT head negative. -Follow-up x-ray of left elbow and right tibia/fibula -PT/OT  HTN (hypertension): Blood pressure 117/98 -IV hydralazine as needed -Hold Micardis and Lasix due to diarrhea and dehydration  Chronic atrial fibrillation (HCC): Heart rate 101 -Eliquis -Metoprolol 5 mg every 2 hours as needed for heart rate> 125  Cerebrovascular accident (CVA) (HCC) -on Eliquis -zocor  CAD (coronary artery disease) -zocor  Chronic kidney disease, stage 3a (HCC): Renal close to baseline.  Baseline creatinine 1.1-1.36.  His creatinine is 1.31, BUN is 15, GFR 50. -Follow-up with  BMP  HLD (hyperlipidemia) -Zocor  Thrombocytopenia (HCC): This is chronic issue.  Platelet 129 -Follow-up CBC  Depression: -Continue home medications     DVT ppx: on Eliquis  Code Status: DNR per his daughter  Family Communication:   Yes, patient's daughter by phone  Disposition Plan:  Anticipate discharge back to previous environment  Consults called: none    Admission status and Level of care: Telemetry Medical:  for obs    Dispo: The patient is from: SNF              Anticipated d/c is to: SNF              Anticipated d/c date is: 1 day              Patient currently is not medically stable to d/c.    Severity of Illness:  The appropriate patient status for this patient is OBSERVATION. Observation status is judged to be reasonable and necessary in order to provide the required intensity of service to ensure the patient's safety. The patient's presenting symptoms, physical exam findings, and initial radiographic and laboratory data in the context of their medical condition is felt to place them at  decreased risk for further clinical deterioration. Furthermore, it is anticipated that the patient will be medically stable for discharge  from the hospital within 2 midnights of admission.        Date of Service 03/01/2023    Lorretta Harp [facility]   If 7PM-7AM, please contact night-coverage [email or web address] 03/01/2023, 6:39 PM

## 2023-03-02 DIAGNOSIS — W19XXXA Unspecified fall, initial encounter: Secondary | ICD-10-CM | POA: Diagnosis not present

## 2023-03-02 DIAGNOSIS — E876 Hypokalemia: Secondary | ICD-10-CM | POA: Diagnosis not present

## 2023-03-02 DIAGNOSIS — R531 Weakness: Secondary | ICD-10-CM | POA: Diagnosis not present

## 2023-03-02 DIAGNOSIS — K529 Noninfective gastroenteritis and colitis, unspecified: Secondary | ICD-10-CM | POA: Diagnosis not present

## 2023-03-02 DIAGNOSIS — N1831 Chronic kidney disease, stage 3a: Secondary | ICD-10-CM | POA: Diagnosis not present

## 2023-03-02 LAB — BASIC METABOLIC PANEL
Anion gap: 9 (ref 5–15)
BUN: 11 mg/dL (ref 8–23)
CO2: 23 mmol/L (ref 22–32)
Calcium: 8.1 mg/dL — ABNORMAL LOW (ref 8.9–10.3)
Chloride: 100 mmol/L (ref 98–111)
Creatinine, Ser: 1 mg/dL (ref 0.61–1.24)
GFR, Estimated: >60 mL/min (ref >60)
Glucose, Bld: 98 mg/dL (ref 70–99)
Potassium: 3.2 mmol/L — ABNORMAL LOW (ref 3.5–5.1)
Sodium: 132 mmol/L — ABNORMAL LOW (ref 135–145)

## 2023-03-02 LAB — GASTROINTESTINAL PANEL BY PCR, STOOL (REPLACES STOOL CULTURE)

## 2023-03-02 LAB — CBC
HCT: 42.2 % (ref 39.0–52.0)
Hemoglobin: 14 g/dL (ref 13.0–17.0)
MCH: 28.9 pg (ref 26.0–34.0)
MCHC: 33.2 g/dL (ref 30.0–36.0)
MCV: 87 fL (ref 80.0–100.0)
Platelets: 128 10*3/uL — ABNORMAL LOW (ref 150–400)
RBC: 4.85 MIL/uL (ref 4.22–5.81)
RDW: 15.9 % — ABNORMAL HIGH (ref 11.5–15.5)
WBC: 9.3 10*3/uL (ref 4.0–10.5)
nRBC: 0 % (ref 0.0–0.2)

## 2023-03-02 LAB — C DIFFICILE QUICK SCREEN W PCR REFLEX
C Diff antigen: NEGATIVE
C Diff interpretation: NOT DETECTED
C Diff toxin: NEGATIVE

## 2023-03-02 MED ORDER — ORAL CARE MOUTH RINSE: 15.0000 mL | OROMUCOSAL | Status: DC | PRN

## 2023-03-02 MED ORDER — POTASSIUM CHLORIDE CRYS ER 20 MEQ PO TBCR
40.0000 meq | EXTENDED_RELEASE_TABLET | Freq: Once | ORAL | Status: AC
  Administered 2023-03-02: 40 meq via ORAL
  Filled 2023-03-02: qty 2

## 2023-03-02 NOTE — Evaluation (Signed)
Occupational Therapy Evaluation Patient Details Name: Darryl Novak MRN: [id number] DOB: 10/19/1925 Today's Date: 03/02/2023   History of Present Illness presented to ER secondary to diarrhea, weakness and falls; admitted for management of acute colitis.   Clinical Impression   [name] was seen for OT evaluation this date. Prior to hospital admission, pt received assistance from ALF staff for ADL management including assistance with shower transfers, dressing, and functional mobility. Pt presents to acute OT demonstrating impaired ADL performance and functional mobility 2/2 generalized weakness, decreased activity tolerance, significant fear of falling, & decreased safety awareness (See OT problem list for additional functional deficits). Pt currently requires MOD A +2 for functional transfers, MIN A for UB ADL management including feeding and grooming, and MAX A for LB ADL Management with +2 assist for STS t/fs.  Pt would benefit from skilled OT services to address noted impairments and functional limitations (see below for any additional details) in order to maximize safety and independence while minimizing falls risk and caregiver burden. Anticipate the need for follow up OT services upon acute hospital DC.       Recommendations for follow up therapy are one component of a multi-disciplinary discharge planning process, led by the attending physician.  Recommendations may be updated based on patient status, additional functional criteria and insurance authorization.   Assistance Recommended at Discharge Frequent or constant Supervision/Assistance  Patient can return home with the following Two people to help with walking and/or transfers;Two people to help with bathing/dressing/bathroom;Assistance with cooking/housework;Assist for transportation;Direct supervision/assist for medications management;Direct supervision/assist for financial management    Functional Status Assessment  Patient  has had a recent decline in their functional status and demonstrates the ability to make significant improvements in function in a reasonable and predictable amount of time.  Equipment Recommendations  None recommended by OT    Recommendations for Other Services       Precautions / Restrictions Precautions Precautions: Fall Restrictions Weight Bearing Restrictions: No      Mobility Bed Mobility Overal bed mobility: Needs Assistance             General bed mobility comments: deferred. Pt in recliner at start/end of session    Transfers Overall transfer level: Needs assistance Equipment used: 2 person hand held assist Transfers: Sit to/from Stand Sit to Stand: Mod assist, +2 physical assistance                  Balance Overall balance assessment: Needs assistance Sitting-balance support: No upper extremity supported, Feet supported Sitting balance-Leahy Scale: Good Sitting balance - Comments: steady static sitting in recliner chair. Able to scoot to edge of chair for upright positioning without back support during UB ADL management.   Standing balance support: Bilateral upper extremity supported Standing balance-Leahy Scale: Poor                             ADL either performed or assessed with clinical judgement   ADL Overall ADL's : Needs assistance/impaired                                       General ADL Comments: Pt is functionally limited by generalized weakness, increased fear of falling, decreased safety awareness, and decreased activity tolerance. He reports having specific items in reach at home place which supports his functional independence there. Despite, modifications he  does require at least some assistance from staff for ADL management at baseline. Today requires +2 MOD A for functional mobility, MIN A for UB grooming in recliner, MAX A for peri care & LB dressing.     Vision Baseline Vision/History: 2 Legally  blind Ability to See in Adequate Light: 4 Severely impaired Patient Visual Report: No change from baseline       Perception     Praxis      Pertinent Vitals/Pain Pain Assessment Pain Assessment: No/denies pain     Hand Dominance Right   Extremity/Trunk Assessment Upper Extremity Assessment Upper Extremity Assessment: Generalized weakness (No focal deficits appreciated, grossly 3+/5 t/o BUE)   Lower Extremity Assessment Lower Extremity Assessment: Generalized weakness;Defer to PT evaluation       Communication Communication Communication: HOH   Cognition Arousal/Alertness: Awake/alert Behavior During Therapy: WFL for tasks assessed/performed Overall Cognitive Status: Within Functional Limits for tasks assessed                                 General Comments: very anxious, fearful of falling     General Comments       Exercises Other Exercises Other Exercises: Pt educated on role of OT in acute setting, provided with vision strategies including topographical orientation to room and meal tray items to maximize safety and functional independence and comfort while acutely hospitalized. OT facilitated seated UB ADL management with education and assist as described above.   Shoulder Instructions      Home Living Family/patient expects to be discharged to:: Assisted living                             Home Equipment: Rollator (4 wheels);Shower seat - built in;Grab bars - tub/shower;Hand held shower head;Wheelchair - manual   Additional Comments: At Baseline, resident of [facility] (x4 years)      Prior Functioning/Environment Prior Level of Function : Needs assist             Mobility Comments: WC level as primary mobility, 4WW for short transfers ADLs Comments: ALF staff assist for all transfers and ADLs (hand-over-hand and guidance due to baseline visual deficits). Pt reports he is able to perform bathing once assisted into shower  by staff, uses 4WW for transfers to commode, shower, etc.        OT Problem List: Decreased strength;Decreased coordination;Impaired vision/perception;Impaired balance (sitting and/or standing);Decreased knowledge of use of DME or AE;Decreased safety awareness;Decreased activity tolerance      OT Treatment/Interventions: Self-care/ADL training;Therapeutic exercise;Therapeutic activities;Balance training;DME and/or AE instruction;Patient/family education;Energy conservation    OT Goals(Current goals can be found in the care plan section) Acute Rehab OT Goals Patient Stated Goal: To go back to [facility] OT Goal Formulation: With patient Time For Goal Achievement: 03/16/23 Potential to Achieve Goals: Good ADL Goals Pt Will Perform Grooming: sitting;with set-up;with supervision Pt Will Transfer to Toilet: bedside commode;stand pivot transfer;with mod assist Pt Will Perform Toileting - Clothing Manipulation and hygiene: sitting/lateral leans;sit to/from stand;with min assist;with adaptive equipment  OT Frequency: Min 2X/week    Co-evaluation              AM-PAC OT "6 Clicks" Daily Activity     Outcome Measure Help from another person eating meals?: A Little Help from another person taking care of personal grooming?: A Little Help from another person toileting, which includes  using toliet, bedpan, or urinal?: A Lot Help from another person bathing (including washing, rinsing, drying)?: A Lot Help from another person to put on and taking off regular upper body clothing?: A Lot Help from another person to put on and taking off regular lower body clothing?: A Lot 6 Click Score: 14   End of Session    Activity Tolerance: Patient tolerated treatment well Patient left: in chair;with call bell/phone within reach;with chair alarm set  OT Visit Diagnosis: Other abnormalities of gait and mobility (R26.89);Muscle weakness (generalized) (M62.81)                Time: 1914-7829 OT Time  Calculation (min): 24 min Charges:  OT General Charges $OT Visit: 1 Visit OT Evaluation $OT Eval Moderate Complexity: 1 Mod OT Treatments $Self Care/Home Management : 8-22 mins  Rockney Ghee, M.S., OTR/L 03/02/23, 2:38 PM

## 2023-03-02 NOTE — Progress Notes (Signed)
[facility]  - [place] [facility]   PATIENT NAME: Darryl Novak    MR#:  [id number]  DATE OF BIRTH:  Sep 05, 1926  SUBJECTIVE:  patient is legally blind. No family at bedside. Spoke with son Darryl Novak on the phone. Overall seems to be doing well. Denies abdominal pain nausea vomiting or diarrhea. Tolerating PO diet. Patient sitting out in the chair.    VITALS:  Blood pressure 128/64, pulse 72, temperature 98 F (36.7 C), temperature source Oral, resp. rate 18, height 5\' 8"  (1.727 m), weight 74 kg, SpO2 92 %.  PHYSICAL EXAMINATION:   GENERAL:  89 y.o.-year-old patient with no acute distress. Legally blind LUNGS: Normal breath sounds bilaterally, no wheezing CARDIOVASCULAR: S1, S2 normal. No murmur   ABDOMEN: Soft, nontender, nondistended.  EXTREMITIES: No  edema b/l.    NEUROLOGIC: nonfocal  patient is alert and awake   LABORATORY PANEL:  CBC Recent Labs  Lab 03/02/23 0512  WBC 9.3  HGB 14.0  HCT 42.2  PLT 128*    Chemistries  Recent Labs  Lab 03/01/23 1542 03/02/23 0512  NA  --  132*  K  --  3.2*  CL  --  100  CO2  --  23  GLUCOSE  --  98  BUN  --  11  CREATININE  --  1.00  CALCIUM  --  8.1*  AST 40  --   ALT 11  --   ALKPHOS 59  --   BILITOT 1.2  --    Cardiac Enzymes No results for input(s): "TROPONINI" in the last 168 hours. RADIOLOGY:  DG Elbow 2 Views Left  Result Date: 03/01/2023 CLINICAL DATA:  Wall EXAM: LEFT ELBOW - 2 VIEW COMPARISON:  None Available. FINDINGS: No evidence of fracture or dislocation. Small joint effusion. Mild degenerative changes of the left elbow. Vascular calcifications. Soft tissues are unremarkable. IMPRESSION: 1. No evidence of fracture or dislocation. 2. Small elbow joint effusion, findings can be seen in the setting of occult fracture. In this age group, radial head fracture is most common. Electronically Signed   By: Allegra Lai M.D.   On: 03/01/2023 19:32   DG Tibia/Fibula Right  Result Date:  03/01/2023 CLINICAL DATA:  Fall yesterday. EXAM: RIGHT TIBIA AND FIBULA - 2 VIEW COMPARISON:  None Available. FINDINGS: There is no evidence of fracture or other focal bone lesions. Knee and ankle alignment are maintained. Advanced arterial vascular calcifications. IMPRESSION: 1. No fracture of the right lower leg. 2. Advanced arterial vascular calcifications. Electronically Signed   By: Narda Rutherford M.D.   On: 03/01/2023 19:30   CT ABDOMEN PELVIS W CONTRAST  Result Date: 03/01/2023 CLINICAL DATA:  Pain left lower quadrant of abdomen EXAM: CT ABDOMEN AND PELVIS WITH CONTRAST TECHNIQUE: Multidetector CT imaging of the abdomen and pelvis was performed using the standard protocol following bolus administration of intravenous contrast. RADIATION DOSE REDUCTION: This exam was performed according to the departmental dose-optimization program which includes automated exposure control, adjustment of the mA and/or kV according to patient size and/or use of iterative reconstruction technique. CONTRAST:  OMNIPAQUE IOHEXOL 300 MG/ML  SOLN COMPARISON:  None Available. FINDINGS: Lower chest: Increased interstitial markings are seen in posterior lower lung fields. Heart is enlarged in size. Coronary artery calcifications are seen. Atherosclerotic changes are noted in aorta. Hepatobiliary: Small high density foci are seen in dependent portion of gallbladder suggesting tiny stones. There is mild nodularity of the liver surface. There is no dilation of bile ducts.  Pancreas: There is atrophy.  No focal abnormalities are seen. Spleen: Unremarkable. Adrenals/Urinary Tract: Adrenals are unremarkable. There is no hydronephrosis. There are no renal or ureteral stones. Urinary bladder is unremarkable. Stomach/Bowel: Stomach is not distended. Small bowel loops are not dilated. Appendix is not distinctly seen. There is no pericecal stranding. There is mild mucosal enhancement in right colon. There is no significant wall thickening  in colon. There is no pericolic stranding. Vascular/Lymphatic: There are scattered arterial calcifications. Reproductive: Prostate is enlarged. There is balloon with calcification in the margins behind the rectus muscles in suprapubic region, possibly device used for management of erectile dysfunction. Other: There is no ascites or pneumoperitoneum. Left inguinal hernia containing fat is seen. Small right inguinal hernia containing fat is seen. Umbilical hernia containing fat is seen. Musculoskeletal: Degenerative changes are noted in thoracic and lumbar spine. There is moderate decrease in height of body of L5 vertebra, possibly old compression. IMPRESSION: There is no evidence of intestinal obstruction or pneumoperitoneum. There is no hydronephrosis. Cardiomegaly. Coronary artery disease. Increased interstitial markings in the lower lung fields may suggest scarring or interstitial pneumonia. Gallbladder stones. There is mild nodularity of the liver surface suggesting possible cirrhosis. There is mild mucosal enhancement in right colon which may be due to incomplete distention or nonspecific colitis. Scattered diverticula are seen in colon without signs of focal acute diverticulitis. Large left inguinal hernia containing fat. Small umbilical and small right inguinal hernia containing fat. Enlarged prostate. Decrease in height of body of L5 vertebra may suggest old compression. Electronically Signed   By: Ernie Avena M.D.   On: 03/01/2023 16:55   CT HEAD WO CONTRAST  Result Date: 03/01/2023 CLINICAL DATA:  Fall, head trauma. EXAM: CT HEAD WITHOUT CONTRAST TECHNIQUE: Contiguous axial images were obtained from the base of the skull through the vertex without intravenous contrast. RADIATION DOSE REDUCTION: This exam was performed according to the departmental dose-optimization program which includes automated exposure control, adjustment of the mA and/or kV according to patient size and/or use of iterative  reconstruction technique. COMPARISON:  CT head 07/10/2022.  MRI brain 06/27/2020. FINDINGS: Brain: No evidence of acute infarction, hemorrhage, hydrocephalus, extra-axial collection or mass lesion/mass effect. There is stable moderate diffuse atrophy and extensive periventricular and deep white matter hypodensity, likely chronic small vessel ischemic change. Vascular: Atherosclerotic calcifications are present within the cavernous internal carotid arteries. Skull: Normal. Negative for fracture or focal lesion. Sinuses/Orbits: No acute finding. There is a polyp or mucous retention cysts in the inferior right maxillary sinus measuring 1.9 x 0.7 cm. Other: Peripheral vascular calcifications are present. IMPRESSION: 1. No acute intracranial abnormality. 2. Stable moderate diffuse atrophy and extensive chronic small vessel ischemic change. Electronically Signed   By: Darliss Cheney M.D.   On: 03/01/2023 15:14    Assessment and Plan  HARCE CANTU is a 89 y.o. 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  Spoke with patient's son Eufemio Stfleur who reported patient was sitting on his shower stool. He stooped down to dry his legs and the shower chair stooped in front getting patient to slide off the chair and falling. Patient did have to watery diarrhea yesterday. He takes PRN low motel at the facility per son no diarrhea today.  possible acute colitis: pt has diarrhea, CT scan showed  mild mucosal enhancement in right colon, which  may be due to mild colitis.  Does not meet criteria for sepsis (WBC 8.9, temperature normal, heart rate 101, RR 20).  -- Per son patient had to loose stools yesterday. No abdominal pain no fever no nausea vomiting. Patient denies any diarrhea today. -- White count is normal -see-EDF and  stool studies negative -- I do not feel patient has active colitis. Will discontinue antibiotics. Son is in agreement. -- Patient tolerating PO diet well   Fall at home, initial encounter: CT head negative. -Follow-up x-ray of left elbow and right tibia/fibula-- no fracture -PT/OT-- will resume PT at [facility]. Son tells me it already has been arranged.   HTN (hypertension): Blood pressure 117/98 -IV hydralazine as needed -Hold Micardis and Lasix due to diarrhea and dehydration   Chronic atrial fibrillation (HCC): Heart rate 101 -Eliquis   Cerebrovascular accident (CVA) (HCC) -on Eliquis -zocor   CAD (coronary artery disease) -zocor   Chronic kidney disease, stage 3a (HCC): hypokalemia Renal close to baseline.  Baseline creatinine 1.1-1.36.  His creatinine is 1.31, BUN is 15, GFR 50. -creat 1.0 -- patient received oral potassium   HLD (hyperlipidemia) -Zocor   Thrombocytopenia (HCC): This is chronic issue.  Platelet 129  Depression: -Continue home medications   overall patient is hemodynamically stable. Appears to be at baseline. He is legally blind. Discussed with patient's son Darryl Novak. [facility] to [facility] if remains stable. Patient and son in agreement.     DVT ppx: on Eliquis   Code Status: DNR  Family Communication:   Yes, patient's son Darryl Novak by phone   Disposition Plan:  Anticipate discharge back to previous environment tomorrow      TOTAL TIME TAKING CARE OF THIS PATIENT: 35 minutes.  >50% time spent on counselling and coordination of care  Note: This dictation was prepared with Dragon dictation along with smaller phrase technology. Any transcriptional errors that result from this process are unintentional.  Enedina Finner M.D    [facility]   CC: Primary care physician; Gracelyn Nurse, MD

## 2023-03-02 NOTE — TOC Initial Note (Signed)
Transition of Care [facility]) - Initial/Assessment Note    Patient Details  Name: Darryl Novak MRN: [id number] Date of Birth: September 11, 1926  [facility]) CM/SW Contact:    Kemper Durie, RN Phone Number: 03/02/2023, 12:04 PM  Clinical Narrative:                  Sherron Monday to son/POA Jillyn Hidden, confirms that patient is from [facility] for [facility].  He verifies that contract exists with facility that provides hands on assistance x2 for patient with transitioning and for ADLs (has call button at his residence to use prior to getting up).  Also report that patient had PT evaluation at facility last week and will start PT/OT sessions this week.  He feels patient will be safe discharging back to prior living conditions.  Per MD, anticipate discharge tomorrow.   Expected Discharge Plan: Against Medical Advice Barriers to Discharge: Continued Medical Work up   Patient Goals and CMS Choice Patient states their goals for this hospitalization and ongoing recovery are:: Back to [facility], increase strength          Expected Discharge Plan and Services       Living arrangements for the past 2 months: Assisted Living Facility                                      Prior Living Arrangements/Services Living arrangements for the past 2 months: Assisted Living Facility Lives with:: Facility Resident Patient language and need for interpreter reviewed:: Yes Do you feel safe going back to the place where you live?: Yes      Need for Family Participation in Patient Care: Yes (Comment) Care giver support system in place?: Yes (comment) Current home services: DME, Home PT, Home OT, Homehealth aide Criminal Activity/Legal Involvement Pertinent to Current Situation/Hospitalization: No - Comment as needed  Activities of Daily Living Home Assistive Devices/Equipment: Shower chair with back ADL Screening (condition at time of admission) Patient's cognitive ability adequate to safely complete daily  activities?: Yes Is the patient deaf or have difficulty hearing?: Yes Does the patient have difficulty seeing, even when wearing glasses/contacts?: Yes Does the patient have difficulty concentrating, remembering, or making decisions?: Yes Patient able to express need for assistance with ADLs?: Yes Does the patient have difficulty dressing or bathing?: Yes Independently performs ADLs?: No Does the patient have difficulty walking or climbing stairs?: Yes Weakness of Legs: Both Weakness of Arms/Hands: Both  Permission Sought/Granted Permission sought to share information with : Case Manager Permission granted to share information with : Yes, Verbal Permission Granted  Share Information with NAME: Jillyn Hidden  Permission granted to share info w AGENCY: [facility]  Permission granted to share info w Relationship: son  Permission granted to share info w Contact Information: Alvi,"Gary" (POA)Kalin (Son) [phone]  Emotional Assessment              Admission diagnosis:  Diarrhea of presumed infectious origin [R19.7] Dehydration [E86.0] Colitis [K52.9] Acute colitis [K52.9] Generalized weakness [R53.1] Patient Active Problem List   Diagnosis Date Noted   Acute colitis 03/01/2023   Fall at home, initial encounter 03/01/2023   HTN (hypertension) 03/01/2023   CAD (coronary artery disease) 03/01/2023   Chronic kidney disease, stage 3a (HCC) 03/01/2023   Depression 03/01/2023   Thrombocytopenia (HCC) 03/01/2023   Insomnia 04/26/2020   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   Fever 01/02/2018  Adult failure to thrive 12/31/2017   Weakness generalized 12/27/2017   Hypotension 12/27/2017   Decubital ulcer 12/27/2017   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   Acute renal failure (ARF) (HCC) 11/13/2017   Vision loss, bilateral 11/07/2017   Chronic atrial fibrillation (HCC) 04/10/2016   Obstructive sleep apnea syndrome 10/17/2015   Cerebrovascular accident, old 09/19/2014    Arteriosclerosis of coronary artery 03/28/2014   HLD (hyperlipidemia) 03/28/2014   BP (high blood pressure) 03/28/2014   Addison anemia 03/28/2014   Peripheral vascular disease (HCC) 03/28/2014   Cerebrovascular accident (CVA) (HCC) 03/28/2014   Atherosclerotic heart disease of native coronary artery without angina pectoris 03/28/2014   PCP:  Gracelyn Nurse, MD Pharmacy:   [address] [place], [place] - [address] [place] [address] Phone: [phone] Fax: [phone]  [facility] - [place], [place] - [address] [place] [address] Phone: [phone] Fax: [phone]  [facility] - [place], [place] - [address] [place] [address] Phone: [phone] Fax: [phone]  [facility] - [place], [place] - [address] [place] [address] Phone: [phone] Fax: [phone]     Social Determinants of Health (SDOH) Social History: SDOH Screenings   Food Insecurity: No Food Insecurity (03/01/2023)  Housing: Low Risk  (03/01/2023)  Transportation Needs: No Transportation Needs (03/01/2023)  Utilities: Not At Risk (03/01/2023)  Tobacco Use: Medium Risk (03/01/2023)   SDOH Interventions:     Readmission Risk Interventions     No data to display

## 2023-03-02 NOTE — Evaluation (Signed)
Physical Therapy Evaluation Patient Details Name: Darryl Novak MRN: [id number] DOB: 1926/01/25 Today's Date: 03/02/2023  History of Present Illness  presented to ER secondary to diarrhea, weakness and falls; admitted for management of acute colitis.  Clinical Impression  Patient resting in bed upon arrival to room; CNA at bedside for hygiene/gown change.  Patient alert and oriented to basic information; follows simple commands.  Very anxious, fearful of falling; requiring step-by-step cuing and hand-over-hand guidance for understanding and optimal participation with all functional activities.  Denies pain; does endorse generalized weakness.  Bilat UE/LE strength and ROM grossly symmetrical and WFL for basic transfers and gait; no focal weakness appreciated.  Currently requiring mod/max assist for bed mobility; mod assist +2 for partial sit/stand and bed/chair transfer with bilat HHA.   Noted difficulty achieving fully upright posture (due to need for continuous UE assist on seating surface transferring towards).  Very short, shuffling steps; very fearful of transfer.  Requiring +2 for optimal safety. Would benefit from skilled PT to address above deficits and promote optimal return to PLOF.; recommend post-acute PT follow up as indicated by interdisciplinary care team.         Recommendations for follow up therapy are one component of a multi-disciplinary discharge planning process, led by the attending physician.  Recommendations may be updated based on patient status, additional functional criteria and insurance authorization.  Follow Up Recommendations Can patient physically be transported by private vehicle: No     Assistance Recommended at Discharge Frequent or constant Supervision/Assistance  Patient can return home with the following  Two people to help with walking and/or transfers;Two people to help with bathing/dressing/bathroom    Equipment Recommendations    Recommendations for  Other Services       Functional Status Assessment Patient has had a recent decline in their functional status and demonstrates the ability to make significant improvements in function in a reasonable and predictable amount of time.     Precautions / Restrictions Precautions Precautions: Fall Restrictions Weight Bearing Restrictions: No      Mobility  Bed Mobility Overal bed mobility: Needs Assistance Bed Mobility: Rolling, Supine to Sit Rolling: Mod assist, Max assist   Supine to sit: Mod assist, Max assist     General bed mobility comments: step-by-step cuing and hand-over-hand guidance required due to baseline visual deficits.  Patient with good efforts as able, but limited significantly by fear of falling.    Transfers Overall transfer level: Needs assistance Equipment used: 2 person hand held assist Transfers: Sit to/from Stand Sit to Stand: Mod assist, +2 physical assistance           General transfer comment: difficulty achieving fully upright posture (due to need for continuous UE assist on seating surface transferring towards).  Very short, shuffling steps; very fearful of transfer.  Requiring +2 for optimal safety.    Ambulation/Gait               General Gait Details: unsafe/unable; non-ambulatory at baseline  Stairs            Wheelchair Mobility    Modified Rankin (Stroke Patients Only)       Balance Overall balance assessment: Needs assistance Sitting-balance support: No upper extremity supported, Feet supported Sitting balance-Leahy Scale: Good     Standing balance support: Bilateral upper extremity supported Standing balance-Leahy Scale: Poor  Pertinent Vitals/Pain Pain Assessment Pain Assessment: No/denies pain    Home Living                     Additional Comments: At Baseline, resident of [facility] (x4 years)    Prior Function Prior Level of Function : Needs  assist             Mobility Comments: WC level as primary mobility; staff assist for all transfers and ADLs (hand-over-hand and guidance due to baseline visual deficits)       Hand Dominance        Extremity/Trunk Assessment   Upper Extremity Assessment Upper Extremity Assessment: Generalized weakness    Lower Extremity Assessment Lower Extremity Assessment: Generalized weakness (grosly 4-/5 throughout; no focal weakness appreciated)       Communication   Communication: HOH  Cognition Arousal/Alertness: Awake/alert Behavior During Therapy: WFL for tasks assessed/performed Overall Cognitive Status: Within Functional Limits for tasks assessed                                 General Comments: very anxious, fearful of falling        General Comments      Exercises     Assessment/Plan    PT Assessment Patient needs continued PT services  PT Problem List Decreased strength;Decreased activity tolerance;Decreased balance;Decreased mobility;Decreased knowledge of use of DME;Decreased safety awareness;Decreased knowledge of precautions       PT Treatment Interventions DME instruction;Functional mobility training;Therapeutic activities;Therapeutic exercise;Balance training;Patient/family education    PT Goals (Current goals can be found in the Care Plan section)  Acute Rehab PT Goals Patient Stated Goal: Don't let me fall PT Goal Formulation: With patient Time For Goal Achievement: 03/16/23 Potential to Achieve Goals: Fair    Frequency Min 3X/week     Co-evaluation               AM-PAC PT "6 Clicks" Mobility  Outcome Measure Help needed turning from your back to your side while in a flat bed without using bedrails?: A Lot Help needed moving from lying on your back to sitting on the side of a flat bed without using bedrails?: A Lot Help needed moving to and from a bed to a chair (including a wheelchair)?: A Lot Help needed standing up from  a chair using your arms (e.g., wheelchair or bedside chair)?: A Lot Help needed to walk in hospital room?: Total Help needed climbing 3-5 steps with a railing? : Total 6 Click Score: 10    End of Session   Activity Tolerance: Patient tolerated treatment well Patient left: in chair;with call bell/phone within reach;with chair alarm set Nurse Communication: Mobility status PT Visit Diagnosis: Muscle weakness (generalized) (M62.81);Difficulty in walking, not elsewhere classified (R26.2)    Time: 1610-9604 PT Time Calculation (min) (ACUTE ONLY): 16 min   Charges:   PT Evaluation $PT Eval Moderate Complexity: 1 Mod          Rosevelt Luu H. Manson Passey, PT, DPT, NCS 03/02/23, 10:13 AM [phone]

## 2023-03-03 DIAGNOSIS — K529 Noninfective gastroenteritis and colitis, unspecified: Secondary | ICD-10-CM | POA: Diagnosis not present

## 2023-03-03 NOTE — NC FL2 (Signed)
[place] MEDICAID FL2 LEVEL OF CARE FORM     IDENTIFICATION  Patient Name: Darryl Novak Birthdate: December 19, 1925 Sex: male Admission Date (Current Location): 03/01/2023  [facility] and [place] Number:  Chiropodist and Address:         Provider Number: [id number]  Attending Physician Name and Address:  Enedina Finner, MD  Relative Name and Phone Number:       Current Level of Care: SNF Recommended Level of Care: Assisted Living Facility Prior Approval Number:    Date Approved/Denied:   PASRR Number:    Discharge Plan: Other (Comment) (ALF)    Current Diagnoses: Patient Active Problem List   Diagnosis Date Noted   Hypokalemia 03/02/2023   Acute colitis 03/01/2023   Fall at home, initial encounter 03/01/2023   HTN (hypertension) 03/01/2023   CAD (coronary artery disease) 03/01/2023   Chronic kidney disease, stage 3a (HCC) 03/01/2023   Depression 03/01/2023   Thrombocytopenia (HCC) 03/01/2023   Insomnia 04/26/2020   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   Fever 01/02/2018   Adult failure to thrive 12/31/2017   Generalized weakness 12/27/2017   Hypotension 12/27/2017   Decubital ulcer 12/27/2017   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   Acute renal failure (ARF) (HCC) 11/13/2017   Vision loss, bilateral 11/07/2017   Chronic atrial fibrillation (HCC) 04/10/2016   Obstructive sleep apnea syndrome 10/17/2015   Cerebrovascular accident, old 09/19/2014   Arteriosclerosis of coronary artery 03/28/2014   HLD (hyperlipidemia) 03/28/2014   BP (high blood pressure) 03/28/2014   Addison anemia 03/28/2014   Peripheral vascular disease (HCC) 03/28/2014   Cerebrovascular accident (CVA) (HCC) 03/28/2014   Atherosclerotic heart disease of native coronary artery without angina pectoris 03/28/2014    Orientation RESPIRATION BLADDER Height & Weight     Time, Self, Situation, Place  Normal Incontinent Weight: 74 kg Height:  5\' 8"  (172.7 cm)  BEHAVIORAL  SYMPTOMS/MOOD NEUROLOGICAL BOWEL NUTRITION STATUS      Continent Diet (low sodium heart healthy)  AMBULATORY STATUS COMMUNICATION OF NEEDS Skin   Extensive Assist Verbally Skin abrasions, Other (Comment) (Skin tear)                       Personal Care Assistance Level of Assistance  Feeding, Bathing, Dressing Bathing Assistance: Maximum assistance Feeding assistance: Maximum assistance Dressing Assistance: Maximum assistance     Functional Limitations Info  Sight, Hearing Sight Info: Impaired Hearing Info: Impaired      SPECIAL CARE FACTORS FREQUENCY  PT (By licensed PT), OT (By licensed OT)     PT Frequency: Agility OT Frequency: agility            Contractures Contractures Info: Not present    Additional Factors Info  Code Status, Allergies Code Status Info: DNR Allergies Info: ace inhibitors           Medication List       STOP taking these medications     sertraline 25 MG tablet Commonly known as: ZOLOFT    telmisartan 40 MG tablet Commonly known as: MICARDIS           TAKE these medications     acetaminophen 325 MG tablet Commonly known as: TYLENOL Take 325 mg by mouth every 6 (six) hours as needed for mild pain or moderate pain.    albuterol 108 (90 Base) MCG/ACT inhaler Commonly known as: VENTOLIN HFA Inhale 2 puffs into the lungs every 6 (six) hours as needed for wheezing or shortness  of breath.    Alpha-Lipoic Acid 100 MG Caps Take 100 mg by mouth daily.    apixaban 5 MG Tabs tablet Commonly known as: ELIQUIS Take 5 mg by mouth 2 (two) times daily.    Biotin 5000 MCG Caps Take 5,000 mcg by mouth daily.    carboxymethylcellulose 0.5 % Soln Commonly known as: REFRESH PLUS Place 1 drop into both eyes daily.    cetirizine 10 MG tablet Commonly known as: ZYRTEC Take 10 mg by mouth daily.    cholecalciferol 10 MCG (400 UNIT) Tabs tablet Commonly known as: VITAMIN D3 Take 400 Units by mouth daily. What changed: Another  medication with the same name was removed. Continue taking this medication, and follow the directions you see here.    Co Q-10 100 MG Caps Take 100 mg by mouth daily.    diphenhydrAMINE 25 mg capsule Commonly known as: BENADRYL Take 50 mg by mouth at bedtime as needed for itching.    fluorouracil 5 % cream Commonly known as: EFUDEX Apply topically 2 (two) times daily. Apply 7 days to scalp What changed: Another medication with the same name was removed. Continue taking this medication, and follow the directions you see here.    furosemide 20 MG tablet Commonly known as: LASIX Take 20 mg by mouth daily.    gabapentin 600 MG tablet Commonly known as: NEURONTIN Take 600 mg by mouth 2 (two) times daily.    hydrocortisone cream 1 % Apply 1 Application topically 2 (two) times daily. Apply to irritated areas where topical chemotherapy cream was used QD-BID PRN.    loperamide 2 MG capsule Commonly known as: IMODIUM Take 2 mg by mouth every 4 (four) hours as needed for diarrhea or loose stools.    meclizine 25 MG tablet Commonly known as: ANTIVERT Take 25 mg by mouth every 8 (eight) hours as needed for dizziness.    multivitamin tablet Take 1 tablet by mouth daily.    potassium chloride 10 MEQ tablet Commonly known as: KLOR-CON Take 10 mEq by mouth daily.    prednisoLONE acetate 1 % ophthalmic suspension Commonly known as: PRED FORTE Place 1 drop into the left eye 2 (two) times daily.    simvastatin 40 MG tablet Commonly known as: ZOCOR Take 40 mg by mouth daily.    traZODone 100 MG tablet Commonly known as: DESYREL Take 100 mg by mouth at bedtime.                 Relevant Imaging Results:  Relevant Lab Results:   Additional Information SS# [id number]  Chapman Fitch, RN

## 2023-03-03 NOTE — Plan of Care (Signed)

## 2023-03-03 NOTE — TOC Transition Note (Signed)
Transition of Care [facility]) - CM/SW Discharge Note   Patient Details  Name: Darryl Novak MRN: [id number] Date of Birth: 10/06/1925  [facility] CM/SW Contact:  Chapman Fitch, RN Phone Number: 03/03/2023, 11:09 AM   Clinical Narrative:      Patient will DC to: [facility] Anticipated DC date: 03/03/23  Family notified: MD has notified Jillyn Hidden and Technical brewer by: Per MD Gavin Pound to transport   Per MD patient ready for DC to . RN, patient, patient's family, and facility notified of DC. Discharge Summary, fl2 and resumption of therapy orders sent to Sarah sent to facility. RN given number for report.  Per Ale[facility] at [facility] patient to be opened with agility therapy  TOC signing off.  Bevelyn Ngo [facility] [phone]    Barriers to Discharge: Continued Medical Work up   Patient Goals and CMS Choice      Discharge Placement                         Discharge Plan and Services Additional resources added to the After Visit Summary for                                       Social Determinants of Health (SDOH) Interventions SDOH Screenings   Food Insecurity: No Food Insecurity (03/01/2023)  Housing: Low Risk  (03/01/2023)  Transportation Needs: No Transportation Needs (03/01/2023)  Utilities: Not At Risk (03/01/2023)  Tobacco Use: Medium Risk (03/01/2023)     Readmission Risk Interventions     No data to display

## 2023-03-03 NOTE — Discharge Summary (Signed)
Physician Discharge Summary   Patient: Darryl Novak MRN: [id number] DOB: 1926-02-24  Admit date:     03/01/2023  Discharge date: 03/03/23  Discharge Physician: Enedina Finner   PCP: Gracelyn Nurse, MD   Recommendations at discharge:    F/u PCP in 1-2 weeks  Discharge Diagnoses: Principal Problem:   Acute colitis Active Problems:   Fall at home, initial encounter   HTN (hypertension)   Chronic atrial fibrillation (HCC)   Cerebrovascular accident (CVA) (HCC)   CAD (coronary artery disease)   Chronic kidney disease, stage 3a (HCC)   HLD (hyperlipidemia)   Thrombocytopenia (HCC)   Depression   Generalized weakness   Hypokalemia Darryl Novak is a 89 y.o. 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  Spoke with patient's son Kymoni Quintal who reported patient was sitting on his shower stool. He stooped down to dry his legs and the shower chair stooped in front getting patient to slide off the chair and falling. Patient did have to watery diarrhea yesterday. He takes PRN low motel at the facility per son no diarrhea today.   possible acute colitis: pt has diarrhea, CT scan showed  mild mucosal enhancement in right colon, which may be due to mild colitis.  Does not meet criteria for sepsis (WBC 8.9, temperature normal, heart rate 101, RR 20).  -- Per son patient had to loose stools yesterday. No abdominal pain no fever no nausea vomiting. Patient denies any diarrhea today. -- White count is normal -see-EDF and stool studies negative -- I do not feel patient has active colitis. Will discontinue antibiotics. Son is in agreement. -- Patient tolerating PO diet well. NO diarrhea reported   Fall at home, initial encounter: CT head negative. -Follow-up x-ray of left elbow and right  tibia/fibula-- no fracture -PT/OT-- will resume PT at home place. Son tells me it already has been arranged.   HTN (hypertension): Blood pressure 117/98 -IV hydralazine as needed -Hold Micardis and Lasix due to diarrhea and dehydration   Chronic atrial fibrillation (HCC): Heart rate 101 -Eliquis   Cerebrovascular accident (CVA) (HCC) -on Eliquis -zocor   CAD (coronary artery disease) -zocor   Chronic kidney disease, stage 3a (HCC): hypokalemia Renal close to baseline.  Baseline creatinine 1.1-1.36.  His creatinine is 1.31, BUN is 15, GFR 50. -creat 1.0 -- patient received oral potassium   HLD (hyperlipidemia) -Zocor   Thrombocytopenia (HCC): chronic issue.  Platelet 129   Depression: -Continue home medications  Legally Blind per pt due to Shingles    overall patient is hemodynamically stable. Appears to be at baseline. He is legally blind. . Will [facility] back to [facility] today.      DVT ppx: on Eliquis  Code Status: DNR  Family Communication:   Aleatha Borer  Disposition Plan:  Anticipate discharge back to previous environment tomorrow     Diet recommendation:  Discharge Diet Orders (From admission, onward)     Start     Ordered   03/03/23 0000  Diet - low sodium heart healthy        03/03/23 0905            DISCHARGE MEDICATION: Allergies as of 03/03/2023       Reactions   Ace Inhibitors Other (See Comments)   Intolerant        Medication List  STOP taking these medications    sertraline 25 MG tablet Commonly known as: ZOLOFT   telmisartan 40 MG tablet Commonly known as: MICARDIS       TAKE these medications    acetaminophen 325 MG tablet Commonly known as: TYLENOL Take 325 mg by mouth every 6 (six) hours as needed for mild pain or moderate pain.   albuterol 108 (90 Base) MCG/ACT inhaler Commonly known as: VENTOLIN HFA Inhale 2 puffs into the lungs every 6 (six) hours as needed for wheezing or shortness of breath.    Alpha-Lipoic Acid 100 MG Caps Take 100 mg by mouth daily.   apixaban 5 MG Tabs tablet Commonly known as: ELIQUIS Take 5 mg by mouth 2 (two) times daily.   Biotin 5000 MCG Caps Take 5,000 mcg by mouth daily.   carboxymethylcellulose 0.5 % Soln Commonly known as: REFRESH PLUS Place 1 drop into both eyes daily.   cetirizine 10 MG tablet Commonly known as: ZYRTEC Take 10 mg by mouth daily.   cholecalciferol 10 MCG (400 UNIT) Tabs tablet Commonly known as: VITAMIN D3 Take 400 Units by mouth daily. What changed: Another medication with the same name was removed. Continue taking this medication, and follow the directions you see here.   Co Q-10 100 MG Caps Take 100 mg by mouth daily.   diphenhydrAMINE 25 mg capsule Commonly known as: BENADRYL Take 50 mg by mouth at bedtime as needed for itching.   fluorouracil 5 % cream Commonly known as: EFUDEX Apply topically 2 (two) times daily. Apply 7 days to scalp What changed: Another medication with the same name was removed. Continue taking this medication, and follow the directions you see here.   furosemide 20 MG tablet Commonly known as: LASIX Take 20 mg by mouth daily.   gabapentin 600 MG tablet Commonly known as: NEURONTIN Take 600 mg by mouth 2 (two) times daily.   hydrocortisone cream 1 % Apply 1 Application topically 2 (two) times daily. Apply to irritated areas where topical chemotherapy cream was used QD-BID PRN.   loperamide 2 MG capsule Commonly known as: IMODIUM Take 2 mg by mouth every 4 (four) hours as needed for diarrhea or loose stools.   meclizine 25 MG tablet Commonly known as: ANTIVERT Take 25 mg by mouth every 8 (eight) hours as needed for dizziness.   multivitamin tablet Take 1 tablet by mouth daily.   potassium chloride 10 MEQ tablet Commonly known as: KLOR-CON Take 10 mEq by mouth daily.   prednisoLONE acetate 1 % ophthalmic suspension Commonly known as: PRED FORTE Place 1 drop into the left  eye 2 (two) times daily.   simvastatin 40 MG tablet Commonly known as: ZOCOR Take 40 mg by mouth daily.   traZODone 100 MG tablet Commonly known as: DESYREL Take 100 mg by mouth at bedtime.        Follow-up Information     Gracelyn Nurse, MD. Schedule an appointment as soon as possible for a visit in 1 week(s).   Specialty: Internal Medicine Why: hospital f/u Contact information: [address] [place] [address] [phone]                Discharge Exam: Ceasar Mons Weights   03/01/23 1330 03/01/23 2147  Weight: 72.6 kg 74 kg  GENERAL:  89 y.o.-year-old patient with no acute distress. Legally blind LUNGS: Normal breath sounds bilaterally, no wheezing CARDIOVASCULAR: S1, S2 normal. No murmur   ABDOMEN: Soft, nontender, nondistended.  EXTREMITIES: No  edema  b/l.    NEUROLOGIC: nonfocal  patient is alert and awake   Condition at discharge: fair  The results of significant diagnostics from this hospitalization (including imaging, microbiology, ancillary and laboratory) are listed below for reference.   Imaging Studies: DG Elbow 2 Views Left  Result Date: 03/01/2023 CLINICAL DATA:  Wall EXAM: LEFT ELBOW - 2 VIEW COMPARISON:  None Available. FINDINGS: No evidence of fracture or dislocation. Small joint effusion. Mild degenerative changes of the left elbow. Vascular calcifications. Soft tissues are unremarkable. IMPRESSION: 1. No evidence of fracture or dislocation. 2. Small elbow joint effusion, findings can be seen in the setting of occult fracture. In this age group, radial head fracture is most common. Electronically Signed   By: Allegra Lai M.D.   On: 03/01/2023 19:32   DG Tibia/Fibula Right  Result Date: 03/01/2023 CLINICAL DATA:  Fall yesterday. EXAM: RIGHT TIBIA AND FIBULA - 2 VIEW COMPARISON:  None Available. FINDINGS: There is no evidence of fracture or other focal bone lesions. Knee and ankle alignment are maintained. Advanced arterial vascular  calcifications. IMPRESSION: 1. No fracture of the right lower leg. 2. Advanced arterial vascular calcifications. Electronically Signed   By: Narda Rutherford M.D.   On: 03/01/2023 19:30   CT ABDOMEN PELVIS W CONTRAST  Result Date: 03/01/2023 CLINICAL DATA:  Pain left lower quadrant of abdomen EXAM: CT ABDOMEN AND PELVIS WITH CONTRAST TECHNIQUE: Multidetector CT imaging of the abdomen and pelvis was performed using the standard protocol following bolus administration of intravenous contrast. RADIATION DOSE REDUCTION: This exam was performed according to the departmental dose-optimization program which includes automated exposure control, adjustment of the mA and/or kV according to patient size and/or use of iterative reconstruction technique. CONTRAST:  OMNIPAQUE IOHEXOL 300 MG/ML  SOLN COMPARISON:  None Available. FINDINGS: Lower chest: Increased interstitial markings are seen in posterior lower lung fields. Heart is enlarged in size. Coronary artery calcifications are seen. Atherosclerotic changes are noted in aorta. Hepatobiliary: Small high density foci are seen in dependent portion of gallbladder suggesting tiny stones. There is mild nodularity of the liver surface. There is no dilation of bile ducts. Pancreas: There is atrophy.  No focal abnormalities are seen. Spleen: Unremarkable. Adrenals/Urinary Tract: Adrenals are unremarkable. There is no hydronephrosis. There are no renal or ureteral stones. Urinary bladder is unremarkable. Stomach/Bowel: Stomach is not distended. Small bowel loops are not dilated. Appendix is not distinctly seen. There is no pericecal stranding. There is mild mucosal enhancement in right colon. There is no significant wall thickening in colon. There is no pericolic stranding. Vascular/Lymphatic: There are scattered arterial calcifications. Reproductive: Prostate is enlarged. There is balloon with calcification in the margins behind the rectus muscles in suprapubic region,  possibly device used for management of erectile dysfunction. Other: There is no ascites or pneumoperitoneum. Left inguinal hernia containing fat is seen. Small right inguinal hernia containing fat is seen. Umbilical hernia containing fat is seen. Musculoskeletal: Degenerative changes are noted in thoracic and lumbar spine. There is moderate decrease in height of body of L5 vertebra, possibly old compression. IMPRESSION: There is no evidence of intestinal obstruction or pneumoperitoneum. There is no hydronephrosis. Cardiomegaly. Coronary artery disease. Increased interstitial markings in the lower lung fields may suggest scarring or interstitial pneumonia. Gallbladder stones. There is mild nodularity of the liver surface suggesting possible cirrhosis. There is mild mucosal enhancement in right colon which may be due to incomplete distention or nonspecific colitis. Scattered diverticula are seen in colon without signs of focal acute  diverticulitis. Large left inguinal hernia containing fat. Small umbilical and small right inguinal hernia containing fat. Enlarged prostate. Decrease in height of body of L5 vertebra may suggest old compression. Electronically Signed   By: Ernie Avena M.D.   On: 03/01/2023 16:55   CT HEAD WO CONTRAST  Result Date: 03/01/2023 CLINICAL DATA:  Fall, head trauma. EXAM: CT HEAD WITHOUT CONTRAST TECHNIQUE: Contiguous axial images were obtained from the base of the skull through the vertex without intravenous contrast. RADIATION DOSE REDUCTION: This exam was performed according to the departmental dose-optimization program which includes automated exposure control, adjustment of the mA and/or kV according to patient size and/or use of iterative reconstruction technique. COMPARISON:  CT head 07/10/2022.  MRI brain 06/27/2020. FINDINGS: Brain: No evidence of acute infarction, hemorrhage, hydrocephalus, extra-axial collection or mass lesion/mass effect. There is stable moderate diffuse  atrophy and extensive periventricular and deep white matter hypodensity, likely chronic small vessel ischemic change. Vascular: Atherosclerotic calcifications are present within the cavernous internal carotid arteries. Skull: Normal. Negative for fracture or focal lesion. Sinuses/Orbits: No acute finding. There is a polyp or mucous retention cysts in the inferior right maxillary sinus measuring 1.9 x 0.7 cm. Other: Peripheral vascular calcifications are present. IMPRESSION: 1. No acute intracranial abnormality. 2. Stable moderate diffuse atrophy and extensive chronic small vessel ischemic change. Electronically Signed   By: Darliss Cheney M.D.   On: 03/01/2023 15:14    Microbiology: Results for orders placed or performed during the hospital encounter of 03/01/23  C Difficile Quick Screen w PCR reflex     Status: None   Collection Time: 03/02/23  2:10 AM   Specimen: STOOL  Result Value Ref Range Status   C Diff antigen NEGATIVE NEGATIVE Final   C Diff toxin NEGATIVE NEGATIVE Final   C Diff interpretation No C. difficile detected.  Final    Comment: Performed at [facility], [address] [place], [place] [address]  Gastrointestinal Panel by PCR , Stool     Status: None   Collection Time: 03/02/23  2:10 AM   Specimen: Stool  Result Value Ref Range Status   Campylobacter species NOT DETECTED NOT DETECTED Final   Plesimonas shigelloides NOT DETECTED NOT DETECTED Final   Salmonella species NOT DETECTED NOT DETECTED Final   Yersinia enterocolitica NOT DETECTED NOT DETECTED Final   Vibrio species NOT DETECTED NOT DETECTED Final   Vibrio cholerae NOT DETECTED NOT DETECTED Final   Enteroaggregative E coli (EAEC) NOT DETECTED NOT DETECTED Final   Enteropathogenic E coli (EPEC) NOT DETECTED NOT DETECTED Final   Enterotoxigenic E coli (ETEC) NOT DETECTED NOT DETECTED Final   Shiga like toxin producing E coli (STEC) NOT DETECTED NOT DETECTED Final   Shigella/Enteroinvasive E coli (EIEC) NOT  DETECTED NOT DETECTED Final   Cryptosporidium NOT DETECTED NOT DETECTED Final   Cyclospora cayetanensis NOT DETECTED NOT DETECTED Final   Entamoeba histolytica NOT DETECTED NOT DETECTED Final   Giardia lamblia NOT DETECTED NOT DETECTED Final   Adenovirus F40/41 NOT DETECTED NOT DETECTED Final   Astrovirus NOT DETECTED NOT DETECTED Final   Norovirus GI/GII NOT DETECTED NOT DETECTED Final   Rotavirus A NOT DETECTED NOT DETECTED Final   Sapovirus (I, II, IV, and V) NOT DETECTED NOT DETECTED Final    Comment: Performed at [facility], [address] [place], [place] [address]    Labs: CBC: Recent Labs  Lab 03/01/23 1335 03/02/23 0512  WBC 8.9 9.3  HGB 14.9 14.0  HCT 46.2  42.2  MCV 90.1 87.0  PLT 129* 128*   Basic Metabolic Panel: Recent Labs  Lab 03/01/23 1335 03/02/23 0512  NA 133* 132*  K 3.5 3.2*  CL 98 100  CO2 25 23  GLUCOSE 145* 98  BUN 15 11  CREATININE 1.31* 1.00  CALCIUM 8.8* 8.1*   Liver Function Tests: Recent Labs  Lab 03/01/23 1542  AST 40  ALT 11  ALKPHOS 59  BILITOT 1.2  PROT 7.7  ALBUMIN 3.8   CBG: Recent Labs  Lab 03/01/23 2021  GLUCAP 110*    Discharge time spent: greater than 30 minutes.  Signed: Enedina Finner, MD [facility] 03/03/2023

## 2023-04-17 ENCOUNTER — Ambulatory Visit (INDEPENDENT_AMBULATORY_CARE_PROVIDER_SITE_OTHER): Payer: Medicare Other | Admitting: Podiatry

## 2023-04-17 ENCOUNTER — Encounter: Payer: Self-pay | Admitting: Podiatry

## 2023-04-17 VITALS — BP 141/65

## 2023-04-17 DIAGNOSIS — I739 Peripheral vascular disease, unspecified: Secondary | ICD-10-CM

## 2023-04-17 DIAGNOSIS — M79674 Pain in right toe(s): Secondary | ICD-10-CM

## 2023-04-17 DIAGNOSIS — D689 Coagulation defect, unspecified: Secondary | ICD-10-CM | POA: Diagnosis not present

## 2023-04-17 DIAGNOSIS — M79675 Pain in left toe(s): Secondary | ICD-10-CM | POA: Diagnosis not present

## 2023-04-17 DIAGNOSIS — B351 Tinea unguium: Secondary | ICD-10-CM

## 2023-04-17 NOTE — Progress Notes (Signed)
  Subjective:  Patient ID: Darryl Novak, male    DOB: 09-26-1926,  MRN: [id number]  Darryl Novak presents to clinic today for at risk foot care with h/o coagulation defect and painful elongated mycotic toenails 1-5 bilaterally which are tender when wearing enclosed shoe gear. Pain is relieved with periodic professional debridement. He is accompanied by his son on today's visit. He nor son voice any new complaints on today's visit. Chief Complaint  Patient presents with   Nail Problem    RFC,Referring Provider Gracelyn Nurse, MD,lov:06/24      New problem(s): None.   PCP is Gracelyn Nurse, MD.  Allergies  Allergen Reactions   Ace Inhibitors Other (See Comments)    Intolerant    Review of Systems: Negative except as noted in the HPI.  Objective: No changes noted in today's physical examination. Vitals:   04/17/23 0926  BP: (!) 141/65   Darryl Novak is a pleasant 89 y.o. male frail, in NAD. AAO x 3.  Vascular Examination: CFT <3 seconds b/l. DP/PT pulses faintly palpable b/l. Skin temperature gradient warm to warm b/l. No pain with calf compression. No ischemia or gangrene. No cyanosis or clubbing noted b/l.    Neurological Examination: Sensation grossly intact b/l with 10 gram monofilament. Vibratory sensation intact b/l. Protective sensation intact 5/5 intact bilaterally with 10g monofilament b/l. Vibratory sensation intact b/l. Proprioception intact bilaterally.  Dermatological Examination: Pedal skin warm and supple b/l.   No open wounds. No interdigital macerations.  Toenails 1-5 b/l thick, discolored, elongated with subungual debris and pain on dorsal palpation.    No hyperkeratotic nor porokeratotic lesions present on today's visit.  Musculoskeletal Examination: Muscle strength 3/5 to all lower extremity muscle groups bilaterally. No pain, crepitus or joint limitation noted with ROM bilateral LE. Utilizes transport chair for mobility  assistance.  Radiographs: None  Assessment/Plan: 1. Pain due to onychomycosis of toenails of both feet   2. Coagulation disorder (HCC)   3. PAD (peripheral artery disease) (HCC)    -Patient was evaluated and treated. All patient's and/or POA's questions/concerns answered on today's visit. -Patient's family member present. All questions/concerns addressed on today's visit. -Patient to continue soft, supportive shoe gear daily. -Toenails 1-5 bilaterally were debrided in length and girth with sterile nail nippers and dremel. Pinpoint bleeding of R 4th toe addressed with Lumicain Hemostatic Solution, cleansed with alcohol. No further treatment required by patient/caregiver. -Patient/POA to call should there be question/concern in the interim.   Return in about 3 months (around 07/18/2023).  Freddie Breech, DPM

## 2023-06-12 ENCOUNTER — Ambulatory Visit (INDEPENDENT_AMBULATORY_CARE_PROVIDER_SITE_OTHER): Payer: Medicare Other | Admitting: Dermatology

## 2023-06-12 ENCOUNTER — Encounter: Payer: Self-pay | Admitting: Dermatology

## 2023-06-12 DIAGNOSIS — L570 Actinic keratosis: Secondary | ICD-10-CM

## 2023-06-12 DIAGNOSIS — R234 Changes in skin texture: Secondary | ICD-10-CM | POA: Diagnosis not present

## 2023-06-12 DIAGNOSIS — W908XXA Exposure to other nonionizing radiation, initial encounter: Secondary | ICD-10-CM | POA: Diagnosis not present

## 2023-06-12 DIAGNOSIS — D492 Neoplasm of unspecified behavior of bone, soft tissue, and skin: Secondary | ICD-10-CM | POA: Diagnosis not present

## 2023-06-12 DIAGNOSIS — D485 Neoplasm of uncertain behavior of skin: Secondary | ICD-10-CM

## 2023-06-12 NOTE — Progress Notes (Signed)
   Follow-Up Visit   Subjective  Darryl Novak is a 89 y.o. male who presents for the following: places on each forearm. Do not itch, not sore but patient concerned.   Patient accompanied by son who contributes to history.  The patient has spots, moles and lesions to be evaluated, some may be new or changing and the patient may have concern these could be cancer.   The following portions of the chart were reviewed this encounter and updated as appropriate: medications, allergies, medical history  Review of Systems:  No other skin or systemic complaints except as noted in HPI or Assessment and Plan.  Objective  Well appearing patient in no apparent distress; mood and affect are within normal limits.   A focused examination was performed of the following areas: arms  Relevant exam findings are noted in the Assessment and Plan.  Right Forearm Erythematous thin papules/macules with gritty scale.     Assessment & Plan     AK (actinic keratosis) Right Forearm  Actinic keratoses are precancerous spots that appear secondary to cumulative UV radiation exposure/sun exposure over time. They are chronic with expected duration over 1 year. A portion of actinic keratoses will progress to squamous cell carcinoma of the skin. It is not possible to reliably predict which spots will progress to skin cancer and so treatment is recommended to prevent development of skin cancer.  Recommend daily broad spectrum sunscreen SPF 30+ to sun-exposed areas, reapply every 2 hours as needed.  Recommend staying in the shade or wearing long sleeves, sun glasses (UVA+UVB protection) and wide brim hats (4-inch brim around the entire circumference of the hat). Call for new or changing lesions.  LN2 deferred today. Patient has follow up 07/2023 with Dr. Gwen Pounds.   Blood crust on skin Left Forearm  Recheck on follow up  Neoplasm of uncertain behavior of skin Right Elbow  Consider biopsy on follow  up if indicated.     Return for with Dr. Kirtland Bouchard, as scheduled.  Anise Salvo, RMA, am acting as scribe for Elie Goody, MD .   Documentation: I have reviewed the above documentation for accuracy and completeness, and I agree with the above.  Elie Goody, MD

## 2023-06-12 NOTE — Patient Instructions (Signed)
Due to recent changes in healthcare laws, you may see results of your pathology and/or laboratory studies on MyChart before the doctors have had a chance to review them. We understand that in some cases there may be results that are confusing or concerning to you. Please understand that not all results are received at the same time and often the doctors may need to interpret multiple results in order to provide you with the best plan of care or course of treatment. Therefore, we ask that you please give [place] 2 business days to thoroughly review all your results before contacting the office for clarification. Should we see a critical lab result, you will be contacted sooner.   If You Need Anything After Your Visit  If you have any questions or concerns for your doctor, please call our main line at [phone] and press option 4 to reach your doctor's medical assistant. If no one answers, please leave a voicemail as directed and we will return your call as soon as possible. Messages left after 4 pm will be answered the following business day.   You may also send [place] a message via MyChart. We typically respond to MyChart messages within 1-2 business days.  For prescription refills, please ask your pharmacy to contact our office. Our fax number is [phone].  If you have an urgent issue when the clinic is closed that cannot wait until the next business day, you can page your doctor at the number below.    Please note that while we do our best to be available for urgent issues outside of office hours, we are not available 24/7.   If you have an urgent issue and are unable to reach [place], you may choose to seek medical care at your doctor's office, retail clinic, urgent care center, or emergency room.  If you have a medical emergency, please immediately call 911 or go to the emergency department.  Pager Numbers  - Dr. Gwen Pounds: [phone]  - Dr. Roseanne Reno: [phone]  - Dr. Katrinka Blazing: [phone]    In the event of inclement weather, please call our main line at [phone] for an update on the status of any delays or closures.  Dermatology Medication Tips: Please keep the boxes that topical medications come in in order to help keep track of the instructions about where and how to use these. Pharmacies typically print the medication instructions only on the boxes and not directly on the medication tubes.   If your medication is too expensive, please contact our office at [phone] option 4 or send [place] a message through MyChart.   We are unable to tell what your co-pay for medications will be in advance as this is different depending on your insurance coverage. However, we may be able to find a substitute medication at lower cost or fill out paperwork to get insurance to cover a needed medication.   If a prior authorization is required to get your medication covered by your insurance company, please allow [place] 1-2 business days to complete this process.  Drug prices often vary depending on where the prescription is filled and some pharmacies may offer cheaper prices.  The website www.goodrx.com contains coupons for medications through different pharmacies. The prices here do not account for what the cost may be with help from insurance (it may be cheaper with your insurance), but the website can give you the price if you did not use any insurance.  - You can print the associated coupon and take it  with your prescription to the pharmacy.  - You may also stop by our office during regular business hours and pick up a GoodRx coupon card.  - If you need your prescription sent electronically to a different pharmacy, notify our office through [facility] or by phone at [phone] option 4.

## 2023-07-17 ENCOUNTER — Emergency Department
Admission: EM | Admit: 2023-07-17 | Discharge: 2023-07-17 | Disposition: A | Discharge status: Home / Self Care | Attending: Emergency Medicine | Admitting: Emergency Medicine

## 2023-07-17 ENCOUNTER — Other Ambulatory Visit: Payer: Self-pay

## 2023-07-17 ENCOUNTER — Emergency Department

## 2023-07-17 DIAGNOSIS — S61411A Laceration without foreign body of right hand, initial encounter: Secondary | ICD-10-CM | POA: Insufficient documentation

## 2023-07-17 DIAGNOSIS — I129 Hypertensive chronic kidney disease with stage 1 through stage 4 chronic kidney disease, or unspecified chronic kidney disease: Secondary | ICD-10-CM | POA: Diagnosis not present

## 2023-07-17 DIAGNOSIS — S51011A Laceration without foreign body of right elbow, initial encounter: Secondary | ICD-10-CM | POA: Insufficient documentation

## 2023-07-17 DIAGNOSIS — I4891 Unspecified atrial fibrillation: Secondary | ICD-10-CM | POA: Diagnosis not present

## 2023-07-17 DIAGNOSIS — Z79899 Other long term (current) drug therapy: Secondary | ICD-10-CM | POA: Diagnosis not present

## 2023-07-17 DIAGNOSIS — Z85828 Personal history of other malignant neoplasm of skin: Secondary | ICD-10-CM | POA: Insufficient documentation

## 2023-07-17 DIAGNOSIS — Z8673 Personal history of transient ischemic attack (TIA), and cerebral infarction without residual deficits: Secondary | ICD-10-CM | POA: Insufficient documentation

## 2023-07-17 DIAGNOSIS — S0181XA Laceration without foreign body of other part of head, initial encounter: Secondary | ICD-10-CM | POA: Insufficient documentation

## 2023-07-17 DIAGNOSIS — S0990XA Unspecified injury of head, initial encounter: Secondary | ICD-10-CM

## 2023-07-17 DIAGNOSIS — N189 Chronic kidney disease, unspecified: Secondary | ICD-10-CM | POA: Insufficient documentation

## 2023-07-17 DIAGNOSIS — Z7901 Long term (current) use of anticoagulants: Secondary | ICD-10-CM | POA: Diagnosis not present

## 2023-07-17 DIAGNOSIS — T07XXXA Unspecified multiple injuries, initial encounter: Secondary | ICD-10-CM

## 2023-07-17 DIAGNOSIS — W19XXXA Unspecified fall, initial encounter: Secondary | ICD-10-CM | POA: Diagnosis not present

## 2023-07-17 MED ORDER — LIDOCAINE HCL (PF) 1 % IJ SOLN
5.0000 mL | Freq: Once | INTRAMUSCULAR | Status: AC
  Administered 2023-07-17: 5 mL via INTRADERMAL
  Filled 2023-07-17: qty 5

## 2023-07-17 MED ORDER — BACITRACIN ZINC 500 UNIT/GM EX OINT
TOPICAL_OINTMENT | Freq: Once | CUTANEOUS | Status: AC
  Administered 2023-07-17: 1 via TOPICAL
  Filled 2023-07-17: qty 1.8

## 2023-07-17 MED ORDER — LIDOCAINE-EPINEPHRINE 2 %-1:100000 IJ SOLN
20.0000 mL | Freq: Once | INTRAMUSCULAR | Status: AC
  Administered 2023-07-17: 20 mL via INTRADERMAL
  Filled 2023-07-17: qty 1

## 2023-07-17 NOTE — ED Notes (Signed)
Attempted to call son, Jillyn Hidden, [place] and left voicemail with this assignment's phone number.

## 2023-07-17 NOTE — ED Provider Notes (Addendum)
[facility] Provider Note    Event Date/Time   First MD Initiated Contact with Patient 07/17/23 0600     (approximate)   History   Fall   HPI  Darryl Novak is a 89 y.o. 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  No loss of consciousness.  Patient states he does not know what led to his fall.  Has multiple lacerations to the forehead, face, large skin tear to the right hand and elbow.  Spoke with patient's son who is his power of attorney.  He declines tetanus vaccine update today.  He agrees to CT imaging but does not want blood work or urine as patient does fall relatively frequently.  He is on his way to the emergency department.   History provided by patient, son, EMS.    Past Medical History:  Diagnosis Date   Heart trouble    Hypertension    MI (myocardial infarction) (HCC) 1978   Neuropathy    Shingles    Squamous cell carcinoma of skin 09/05/2010   Left lat. neck infra-auricular. WD SCC. EDC   Squamous cell carcinoma of skin 08/18/2013   Right medial distal dorsum forearm. KA pattern. EDC   Squamous cell carcinoma of skin 10/03/2016   Left inferior sideburn. SCCis, hypertrophic. EDC 01/30/2017   Squamous cell carcinoma of skin 04/08/2018   Left forearm. WD SCC. Excised 05/06/2018, margins free.   Squamous cell carcinoma of skin 04/08/2018   Right upper arm. SCCis, hypertrophic.   Squamous cell carcinoma of skin 02/17/2019   Mid vertex scalp. SCCis. EDC.   Squamous cell carcinoma of skin 05/28/2021   left forearm - EDC   Squamous cell carcinoma of skin 10/31/2022   R eyebrow - SCC, ED&C   Stroke (HCC)    Ulcer     Past Surgical History:  Procedure Laterality Date   Carotid artery endarterectomy      MEDICATIONS:  Prior to Admission medications    Medication Sig Start Date End Date Taking? Authorizing Provider  acetaminophen (TYLENOL) 325 MG tablet Take 325 mg by mouth every 6 (six) hours as needed for mild pain or moderate pain.    [provider]  albuterol (VENTOLIN HFA) 108 (90 Base) MCG/ACT inhaler Inhale 2 puffs into the lungs every 6 (six) hours as needed for wheezing or shortness of breath.    [provider]  Alpha-Lipoic Acid 100 MG CAPS Take 100 mg by mouth daily.    [provider]  apixaban (ELIQUIS) 5 MG TABS tablet Take 5 mg by mouth 2 (two) times daily.     [provider]  Biotin 5000 MCG CAPS Take 5,000 mcg by mouth daily.    [provider]  carboxymethylcellulose (REFRESH PLUS) 0.5 % SOLN Place 1 drop into both eyes daily.    [provider]  cetirizine (ZYRTEC) 10 MG tablet Take 10 mg by mouth daily.    [provider]  cholecalciferol (VITAMIN D) 400 units TABS tablet Take 400 Units by mouth daily.    [provider]  Coenzyme Q10 (CO Q-10) 100 MG CAPS Take 100 mg by mouth daily.     [provider]  diphenhydrAMINE (BENADRYL) 25 mg capsule Take 50 mg by mouth at bedtime as needed for itching.    [provider]  fluorouracil (EFUDEX) 5 %  cream Apply topically 2 (two) times daily. Apply 7 days to scalp 11/26/21   Deirdre Evener, MD  furosemide (LASIX) 20 MG tablet Take 20 mg by mouth daily. Patient not taking: Reported on 04/17/2023    [provider]  gabapentin (NEURONTIN) 600 MG tablet Take 600 mg by mouth 2 (two) times daily.    [provider]  hydrocortisone cream 1 % Apply 1 Application topically 2 (two) times daily. Apply to irritated areas where topical chemotherapy cream was used QD-BID PRN. 02/26/23   Deirdre Evener, MD  loperamide (IMODIUM) 2 MG capsule Take 2 mg by mouth every 4 (four) hours as needed for diarrhea or loose stools.    [provider]  meclizine (ANTIVERT) 25 MG tablet Take  25 mg by mouth every 8 (eight) hours as needed for dizziness.    [provider]  Multiple Vitamin (MULTIVITAMIN) tablet Take 1 tablet by mouth daily.    [provider]  potassium chloride (KLOR-CON) 10 MEQ tablet Take 10 mEq by mouth daily.    [provider]  prednisoLONE acetate (PRED FORTE) 1 % ophthalmic suspension Place 1 drop into the left eye 2 (two) times daily.    [provider]  simvastatin (ZOCOR) 40 MG tablet Take 40 mg by mouth daily.    [provider]  traZODone (DESYREL) 100 MG tablet Take 100 mg by mouth at bedtime.    [provider]    Physical Exam   Triage Vital Signs: ED Triage Vitals  Encounter Vitals Group     BP 07/17/23 0603 (!) 164/77     Systolic BP Percentile --      Diastolic BP Percentile --      Pulse Rate 07/17/23 0603 86     Resp 07/17/23 0603 10     Temp 07/17/23 0603 97.7 F (36.5 C)     Temp Source 07/17/23 0603 Oral     SpO2 07/17/23 0603 100 %     Weight 07/17/23 0604 180 lb (81.6 kg)     Height 07/17/23 0604 5\' 9"  (1.753 m)     Head Circumference --      Peak Flow --      Pain Score 07/17/23 0608 0     Pain Loc --      Pain Education --      Exclude from Growth Chart --     Most recent vital signs: Vitals:   07/17/23 0603 07/17/23 0608  BP: (!) 164/77   Pulse: 86   Resp: 10   Temp: 97.7 F (36.5 C)   SpO2: 100% 100%     CONSTITUTIONAL: Alert, responds appropriately to questions. Well-appearing; well-nourished; GCS 15 HEAD: Normocephalic; 6 cm laceration across the forehead, 2 cm C-shaped laceration next to the medial right eyebrow EYES: Opacification of the left cornea, pupil of the right eye is reactive ENT: normal nose; no rhinorrhea; moist mucous membranes; pharynx without lesions noted; no dental injury; no septal hematoma, no epistaxis; no facial deformity or bony tenderness NECK: Supple, no midline spinal tenderness, step-off or deformity; trachea midline CARD: RRR;  S1 and S2 appreciated; no murmurs, no clicks, no rubs, no gallops RESP: Normal chest excursion without splinting or tachypnea; breath sounds clear and equal bilaterally; no wheezes, no rhonchi, no rales; no hypoxia or respiratory distress CHEST:  chest wall stable, no crepitus or ecchymosis or deformity, nontender to palpation; no flail chest ABD/GI: Non-distended; soft, non-tender, no rebound, no guarding; no ecchymosis or  other lesions noted PELVIS:  stable, nontender to palpation BACK:  The back appears normal; no midline spinal tenderness, step-off or deformity EXT: Normal ROM in all joints; no edema; normal capillary refill; no cyanosis, no bony tenderness or bony deformity of patient's extremities, no joint effusions, compartments are soft, extremities are warm and well-perfused, no ecchymosis, large skin tear to the right dorsal hand and right elbow, small skin tear to the right shoulder, no bony tenderness over these areas SKIN: Normal color for age and race; warm NEURO: No facial asymmetry, normal speech, moving all extremities equally  ED Results / Procedures / Treatments   LABS: (all labs ordered are listed, but only abnormal results are displayed) Labs Reviewed - No data to display   EKG:  EKG Interpretation Date/Time:  Thursday July 17 2023 06:06:14 EDT Ventricular Rate:  86 PR Interval:    QRS Duration:  94 QT Interval:  397 QTC Calculation: 461 R Axis:   -34  Text Interpretation: Atrial fibrillation Left axis deviation Posterior infarct, old Minimal ST depression, anterolateral leads Baseline wander in lead(s) V6 Confirmed by Rochele Raring [phone]) on 07/17/2023 6:07:44 AM          RADIOLOGY: My personal review and interpretation of imaging: CT scans show no traumatic injury other than forehead hematoma.  I have personally reviewed all radiology reports. CT HEAD WO CONTRAST ( )  Result Date: 07/17/2023 CLINICAL DATA:  Unwitnessed fall. Forehead laceration  with head neck trauma. EXAM: CT HEAD WITHOUT CONTRAST CT MAXILLOFACIAL WITHOUT CONTRAST CT CERVICAL SPINE WITHOUT CONTRAST TECHNIQUE: Multidetector CT imaging of the head, cervical spine, and maxillofacial structures were performed using the standard protocol without intravenous contrast. Multiplanar CT image reconstructions of the cervical spine and maxillofacial structures were also generated. RADIATION DOSE REDUCTION: This exam was performed according to the departmental dose-optimization program which includes automated exposure control, adjustment of the mA and/or kV according to patient size and/or use of iterative reconstruction technique. COMPARISON:  07/10/2022 head and cervical spine CT FINDINGS: CT HEAD FINDINGS Brain: No evidence of acute infarction, hemorrhage, hydrocephalus, extra-axial collection or mass lesion/mass effect. Generalized atrophy and chronic small vessel ischemia in the cerebral white matter. Vascular: No hyperdense vessel or unexpected calcification. Skull: Forehead hematoma without acute fracture. There is superimposed gas correlating with history of laceration. CT MAXILLOFACIAL FINDINGS Osseous: No acute fracture or mandibular dislocation. Orbits: Bilateral cataract resection. No evidence of orbital injury. Sinuses: Negative for hemosinus. Soft tissues: Forehead hematoma with superimposed gas, known from the history. No opaque foreign body. CT CERVICAL SPINE FINDINGS Alignment: Normal. Skull base and vertebrae: No acute fracture. No primary bone lesion or focal pathologic process. Soft tissues and spinal canal: No prevertebral fluid or swelling. No visible canal hematoma. Disc levels: Degenerative endplate and facet spurring which is generalized. Upper chest: Clear apical lungs. IMPRESSION: 1. Forehead hematoma without facial fracture or intracranial hemorrhage. 2. Negative for cervical spine fracture. Electronically Signed   By: Tiburcio Pea M.D.   On: 07/17/2023 07:04   CT  Maxillofacial Wo Contrast  Result Date: 07/17/2023 CLINICAL DATA:  Unwitnessed fall. Forehead laceration with head neck trauma. EXAM: CT HEAD WITHOUT CONTRAST CT MAXILLOFACIAL WITHOUT CONTRAST CT CERVICAL SPINE WITHOUT CONTRAST TECHNIQUE: Multidetector CT imaging of the head, cervical spine, and maxillofacial structures were performed using the standard protocol without intravenous contrast. Multiplanar CT image reconstructions of the cervical spine and maxillofacial structures were also generated. RADIATION DOSE REDUCTION: This exam was performed according to the departmental dose-optimization program which includes automated  exposure control, adjustment of the mA and/or kV according to patient size and/or use of iterative reconstruction technique. COMPARISON:  07/10/2022 head and cervical spine CT FINDINGS: CT HEAD FINDINGS Brain: No evidence of acute infarction, hemorrhage, hydrocephalus, extra-axial collection or mass lesion/mass effect. Generalized atrophy and chronic small vessel ischemia in the cerebral white matter. Vascular: No hyperdense vessel or unexpected calcification. Skull: Forehead hematoma without acute fracture. There is superimposed gas correlating with history of laceration. CT MAXILLOFACIAL FINDINGS Osseous: No acute fracture or mandibular dislocation. Orbits: Bilateral cataract resection. No evidence of orbital injury. Sinuses: Negative for hemosinus. Soft tissues: Forehead hematoma with superimposed gas, known from the history. No opaque foreign body. CT CERVICAL SPINE FINDINGS Alignment: Normal. Skull base and vertebrae: No acute fracture. No primary bone lesion or focal pathologic process. Soft tissues and spinal canal: No prevertebral fluid or swelling. No visible canal hematoma. Disc levels: Degenerative endplate and facet spurring which is generalized. Upper chest: Clear apical lungs. IMPRESSION: 1. Forehead hematoma without facial fracture or intracranial hemorrhage. 2. Negative for  cervical spine fracture. Electronically Signed   By: Tiburcio Pea M.D.   On: 07/17/2023 07:04   CT CERVICAL SPINE WO CONTRAST  Result Date: 07/17/2023 CLINICAL DATA:  Unwitnessed fall. Forehead laceration with head neck trauma. EXAM: CT HEAD WITHOUT CONTRAST CT MAXILLOFACIAL WITHOUT CONTRAST CT CERVICAL SPINE WITHOUT CONTRAST TECHNIQUE: Multidetector CT imaging of the head, cervical spine, and maxillofacial structures were performed using the standard protocol without intravenous contrast. Multiplanar CT image reconstructions of the cervical spine and maxillofacial structures were also generated. RADIATION DOSE REDUCTION: This exam was performed according to the departmental dose-optimization program which includes automated exposure control, adjustment of the mA and/or kV according to patient size and/or use of iterative reconstruction technique. COMPARISON:  07/10/2022 head and cervical spine CT FINDINGS: CT HEAD FINDINGS Brain: No evidence of acute infarction, hemorrhage, hydrocephalus, extra-axial collection or mass lesion/mass effect. Generalized atrophy and chronic small vessel ischemia in the cerebral white matter. Vascular: No hyperdense vessel or unexpected calcification. Skull: Forehead hematoma without acute fracture. There is superimposed gas correlating with history of laceration. CT MAXILLOFACIAL FINDINGS Osseous: No acute fracture or mandibular dislocation. Orbits: Bilateral cataract resection. No evidence of orbital injury. Sinuses: Negative for hemosinus. Soft tissues: Forehead hematoma with superimposed gas, known from the history. No opaque foreign body. CT CERVICAL SPINE FINDINGS Alignment: Normal. Skull base and vertebrae: No acute fracture. No primary bone lesion or focal pathologic process. Soft tissues and spinal canal: No prevertebral fluid or swelling. No visible canal hematoma. Disc levels: Degenerative endplate and facet spurring which is generalized. Upper chest: Clear apical  lungs. IMPRESSION: 1. Forehead hematoma without facial fracture or intracranial hemorrhage. 2. Negative for cervical spine fracture. Electronically Signed   By: Tiburcio Pea M.D.   On: 07/17/2023 07:04     PROCEDURES:  Critical Care performed: No   CRITICAL CARE Performed by: Baxter Hire Sanuel Ladnier   Total critical care time: 0 minutes  Critical care time was exclusive of separately billable procedures and treating other patients.  Critical care was necessary to treat or prevent imminent or life-threatening deterioration.  Critical care was time spent personally by me on the following activities: development of treatment plan with patient and/or surrogate as well as nursing, discussions with consultants, evaluation of patient's response to treatment, examination of patient, obtaining history from patient or surrogate, ordering and performing treatments and interventions, ordering and review of laboratory studies, ordering and review of radiographic studies, pulse oximetry and re-evaluation of  patient's condition.   Procedures    IMPRESSION / MDM / ASSESSMENT AND PLAN / ED COURSE  I reviewed the triage vital signs and the nursing notes.  Patient here after witnessed fall.  On Eliquis.    DIFFERENTIAL DIAGNOSIS (includes but not limited to):   Concussion, hematoma, lacerations, intracranial hemorrhage, skull fracture, cervical spine fracture, facial fracture  Patient's presentation is most consistent with acute presentation with potential threat to life or bodily function.  PLAN: Will obtain CT head, cervical spine and face.  Lacerations will need repair.  No other sign of injury on exam other than skin tears to the right upper extremity with no bony tenderness or bony deformity.  Family declines tetanus vaccine update today.   MEDICATIONS GIVEN IN ED: Medications  lidocaine (PF) (XYLOCAINE) 1 % injection 5 mL (5 mLs Intradermal Given 07/17/23 0613)  bacitracin ointment (1  Application Topical Given 07/17/23 0613)  lidocaine-EPINEPHrine (XYLOCAINE W/EPI) 2 %-1:100000 (with pres) injection 20 mL (20 mLs Intradermal Given 07/17/23 0612)     ED COURSE: CT scans reviewed and interpreted by myself and the radiologist and show no acute abnormality.  Please see Eileen Stanford Poggi's note for laceration repair to the face.  Able to approximate the wound to the right hand using Steri-Strips which is done with okay with.  He is also comfortable managing wounds at home as his wife is a retired Engineer, civil (consulting).  Will give him supplies to take home as well.  Discussed wound care instructions and return precautions.   At this time, I do not feel there is any life-threatening condition present. I reviewed all nursing notes, vitals, pertinent previous records.  All lab and urine results, EKGs, imaging ordered have been independently reviewed and interpreted by myself.  I reviewed all available radiology reports from any imaging ordered this visit.  Based on my assessment, I feel the patient is safe to be discharged home without further emergent workup and can continue workup as an outpatient as needed. Discussed all findings, treatment plan as well as usual and customary return precautions.  They verbalize understanding and are comfortable with this plan.  Outpatient follow-up has been provided as needed.  All questions have been answered.    CONSULTS:  none   OUTSIDE RECORDS REVIEWED: Reviewed last admission in June 2024.       FINAL CLINICAL IMPRESSION(S) / ED DIAGNOSES   Final diagnoses:  Fall, initial encounter  Injury of head, initial encounter  Multiple lacerations     Rx / DC Orders   ED Discharge Orders     None        Note:  This document was prepared using Dragon voice recognition software and may include unintentional dictation errors.      Bea Duren, Layla Maw, DO 07/17/23 [phone]

## 2023-07-17 NOTE — ED Triage Notes (Signed)
Pt BIB [facility] for witnessed fall, no details provided, at [facility] [place]. 3-4 in lac to forehead, 2 skin tears to rt arm

## 2023-07-17 NOTE — ED Provider Notes (Signed)
[facility] Provider Note    Event Date/Time   First MD Initiated Contact with Patient 07/17/23 0600     (approximate)     PROCEDURES:  Critical Care performed:   Marland KitchenMarland KitchenLaceration Repair  Date/Time: 07/17/2023 8:22 AM  Performed by: Jackelyn Hoehn, PA-C Authorized by: Jackelyn Hoehn, PA-C   Consent:    Consent obtained:  Verbal   Consent given by:  Patient and guardian   Risks, benefits, and alternatives were discussed: yes     Risks discussed:  Infection, need for additional repair, nerve damage, pain, poor cosmetic result, poor wound healing, retained foreign body, tendon damage and vascular damage   Alternatives discussed:  No treatment Universal protocol:    Procedure explained and questions answered to patient or proxy's satisfaction: yes     Relevant documents present and verified: yes     Test results available: yes     Imaging studies available: yes     Required blood products, implants, devices, and special equipment available: yes     Site/side marked: yes     Immediately prior to procedure, a time out was called: yes     Patient identity confirmed:  Verbally with patient Anesthesia:    Anesthesia method:  Local infiltration   Local anesthetic:  Lidocaine 1% WITH epi Laceration details:    Location:  Face   Face location:  Forehead   Length (cm):  7.6   Depth (mm):  3 Pre-procedure details:    Preparation:  Patient was prepped and draped in usual sterile fashion and imaging obtained to evaluate for foreign bodies Exploration:    Limited defect created (wound extended): no     Hemostasis achieved with:  Epinephrine and direct pressure   Imaging outcome: foreign body not noted     Wound exploration: wound explored through full range of motion and entire depth of wound visualized     Wound extent: areolar tissue not violated, fascia not violated, no foreign body, no signs of injury, no nerve damage, no tendon damage, no underlying  fracture and no vascular damage     Contaminated: no   Treatment:    Area cleansed with:  Povidone-iodine and saline   Amount of cleaning:  Extensive   Irrigation solution:  Sterile saline   Irrigation method:  Pressure wash and syringe   Visualized foreign bodies/material removed: no     Debridement:  None   Undermining:  None   Scar revision: no   Skin repair:    Repair method:  Sutures and tissue adhesive   Suture size:  5-0   Wound skin closure material used: vicryl.   Suture technique:  Simple interrupted   Number of sutures:  14 Approximation:    Approximation:  Close Repair type:    Repair type:  Intermediate Post-procedure details:    Dressing:  Non-adherent dressing and sterile dressing   Procedure completion:  Tolerated well, no immediate complications .Marland KitchenLaceration Repair  Date/Time: 07/17/2023 8:24 AM  Performed by: Jackelyn Hoehn, PA-C Authorized by: Jackelyn Hoehn, PA-C   Consent:    Consent obtained:  Verbal   Consent given by:  Patient   Risks, benefits, and alternatives were discussed: yes     Risks discussed:  Infection, need for additional repair, nerve damage, pain, poor cosmetic result, poor wound healing, retained foreign body, tendon damage and vascular damage   Alternatives discussed:  No treatment Universal protocol:    Procedure explained and questions answered to  patient or proxy's satisfaction: yes     Relevant documents present and verified: yes     Test results available: yes     Imaging studies available: yes     Required blood products, implants, devices, and special equipment available: yes     Site/side marked: yes     Immediately prior to procedure, a time out was called: yes     Patient identity confirmed:  Verbally with patient Anesthesia:    Anesthesia method:  None Laceration details:    Location:  Shoulder/arm   Shoulder/arm location:  R elbow   Length (cm):  7   Depth (mm):  1 Pre-procedure details:    Preparation:  Patient  was prepped and draped in usual sterile fashion Exploration:    Limited defect created (wound extended): no     Hemostasis achieved with:  Direct pressure   Imaging outcome: foreign body not noted     Wound exploration: wound explored through full range of motion and entire depth of wound visualized     Wound extent: areolar tissue not violated, fascia not violated, no foreign body, no signs of injury, no nerve damage, no tendon damage, no underlying fracture and no vascular damage     Contaminated: no   Treatment:    Area cleansed with:  Povidone-iodine and saline   Amount of cleaning:  Standard   Irrigation solution:  Sterile saline   Irrigation method:  Syringe   Visualized foreign bodies/material removed: no     Debridement:  None   Scar revision: no   Skin repair:    Repair method:  Steri-Strips Approximation:    Approximation:  Close Repair type:    Repair type:  Intermediate Post-procedure details:    Dressing:  Non-adherent dressing   Procedure completion:  Tolerated well, no immediate complications    MEDICATIONS ORDERED IN ED: Medications  lidocaine (PF) (XYLOCAINE) 1 % injection 5 mL (5 mLs Intradermal Given 07/17/23 0613)  bacitracin ointment (1 Application Topical Given 07/17/23 0613)  lidocaine-EPINEPHrine (XYLOCAINE W/EPI) 2 %-1:100000 (with pres) injection 20 mL (20 mLs Intradermal Given 07/17/23 0612)        FINAL CLINICAL IMPRESSION(S) / ED DIAGNOSES   Final diagnoses:  Fall, initial encounter  Injury of head, initial encounter  Multiple lacerations     Rx / DC Orders   ED Discharge Orders     None        Note:  This document was prepared using Dragon voice recognition software and may include unintentional dictation errors.   Jackelyn Hoehn, PA-C 07/17/23 1013    Ward, Layla Maw, DO 07/20/23 2318

## 2023-07-24 ENCOUNTER — Ambulatory Visit: Payer: Medicare Other | Admitting: Dermatology

## 2023-08-18 ENCOUNTER — Encounter: Payer: Self-pay | Admitting: Podiatry

## 2023-08-18 ENCOUNTER — Ambulatory Visit (INDEPENDENT_AMBULATORY_CARE_PROVIDER_SITE_OTHER): Payer: Medicare Other | Admitting: Podiatry

## 2023-08-18 DIAGNOSIS — B351 Tinea unguium: Secondary | ICD-10-CM

## 2023-08-18 DIAGNOSIS — M79674 Pain in right toe(s): Secondary | ICD-10-CM | POA: Diagnosis not present

## 2023-08-18 DIAGNOSIS — M79675 Pain in left toe(s): Secondary | ICD-10-CM

## 2023-08-18 DIAGNOSIS — D689 Coagulation defect, unspecified: Secondary | ICD-10-CM | POA: Diagnosis not present

## 2023-08-22 NOTE — Progress Notes (Signed)
  Subjective:  Patient ID: Darryl Novak, male    DOB: 1926/05/07,  MRN: [id number]  Darryl Novak presents to clinic today for at risk care with h/o coagulation defect. Patient is on long term blood thinner Eliquis. He is accompanied by his son on today's visit.  New problem(s): None.   PCP is Gracelyn Nurse, MD. Last office visit was 08/07/2023.  Allergies  Allergen Reactions   Ace Inhibitors Other (See Comments)    Intolerant   Review of Systems: Negative except as noted in the HPI.  Objective: No changes noted in today's physical examination. There were no vitals filed for this visit. Darryl Novak is a pleasant 89 y.o. male frail, in NAD. AAO x 3. Sitting up in transport chair.  Vascular Examination: CFT <3 seconds b/l. DP/PT pulses faintly palpable b/l. Skin temperature gradient warm to warm b/l. No pain with calf compression. No ischemia or gangrene. No cyanosis or clubbing noted b/l.    Neurological Examination: Sensation grossly intact b/l with 10 gram monofilament. Vibratory sensation intact b/l. Protective sensation intact 5/5 intact bilaterally with 10g monofilament b/l. Vibratory sensation intact b/l. Proprioception intact bilaterally.  Dermatological Examination: Pedal skin warm and supple b/l.   No open wounds. No interdigital macerations.  Toenails 1-5 b/l thick, discolored, elongated with subungual debris and pain on dorsal palpation.    No hyperkeratotic nor porokeratotic lesions present on today's visit.  Musculoskeletal Examination: Muscle strength 3/5 to all lower extremity muscle groups bilaterally. No pain, crepitus or joint limitation noted with ROM bilateral LE. Utilizes transport chair for mobility assistance.  Radiographs: None  Assessment/Plan: 1. Pain due to onychomycosis of toenails of both feet   2. Coagulation disorder [facility])     -Patient's family member present. All questions/concerns addressed on today's visit. -Consent given  for treatment as described below: -Examined patient. -Toenails 1-5 bilaterally were debrided in length and girth with sterile nail nippers and dremel. Pinpoint bleeding of left great toe addressed with Lumicain Hemostatic Solution, cleansed with alcohol. Triple antibiotic ointment applied. No further treatment required by patient/caregiver. -Patient/POA to call should there be question/concern in the interim.   Return in about 3 months (around 11/18/2023).  Darryl Novak, DPM      [place] LOCATION: [address], [place] [address]                   Office [phone]   [facility] LOCATION: [address] [place], [place] [address] Office [phone]

## 2023-10-22 ENCOUNTER — Ambulatory Visit: Payer: Medicare Other | Admitting: Dermatology

## 2023-10-29 ENCOUNTER — Encounter: Payer: Self-pay | Admitting: Dermatology

## 2023-10-29 ENCOUNTER — Ambulatory Visit (INDEPENDENT_AMBULATORY_CARE_PROVIDER_SITE_OTHER): Payer: Medicare Other | Admitting: Dermatology

## 2023-10-29 DIAGNOSIS — Z872 Personal history of diseases of the skin and subcutaneous tissue: Secondary | ICD-10-CM

## 2023-10-29 DIAGNOSIS — W908XXA Exposure to other nonionizing radiation, initial encounter: Secondary | ICD-10-CM

## 2023-10-29 DIAGNOSIS — L570 Actinic keratosis: Secondary | ICD-10-CM

## 2023-10-29 DIAGNOSIS — L578 Other skin changes due to chronic exposure to nonionizing radiation: Secondary | ICD-10-CM | POA: Diagnosis not present

## 2023-10-29 DIAGNOSIS — Z85828 Personal history of other malignant neoplasm of skin: Secondary | ICD-10-CM

## 2023-10-29 DIAGNOSIS — D485 Neoplasm of uncertain behavior of skin: Secondary | ICD-10-CM

## 2023-10-29 NOTE — Patient Instructions (Signed)

## 2023-10-29 NOTE — Progress Notes (Signed)
   Follow-Up Visit   Subjective  Darryl Novak is a 89 y.o. male who presents for the following: The patient has lesions on his scalp, face and arms to be evaluated, some may be new or changing and the patient may have concern these could be cancer. Hx of SCC, hx of Aks   [name] is with patient and contributes to history.   The following portions of the chart were reviewed this encounter and updated as appropriate: medications, allergies, medical history  Review of Systems:  No other skin or systemic complaints except as noted in HPI or Assessment and Plan.  Objective  Well appearing patient in no apparent distress; mood and affect are within normal limits.   A focused examination was performed of the following areas:face,scalp,arms    Relevant exam findings are noted in the Assessment and Plan.    Assessment & Plan   ACTINIC DAMAGE - chronic, secondary to cumulative UV radiation exposure/sun exposure over time - diffuse scaly erythematous macules with underlying dyspigmentation - Recommend daily broad spectrum sunscreen SPF 30+ to sun-exposed areas, reapply every 2 hours as needed.  - Recommend staying in the shade or wearing long sleeves, sun glasses (UVA+UVB protection) and wide brim hats (4-inch brim around the entire circumference of the hat). - Call for new or changing lesions.   Actinic keratoses and likely SCC Lesions suspicious for SCCis or early SCC on right forearm, left vertex scalp, left neck, left triangular fossa Scattered actinic keratoses on forearms scalp  Plan: Discussed that SCC grows slowly and is unlikely to pose a threat to patient. Treatment can be extensive and require EDC plus injections or surgery. Jointly decided to monitor since lesions are not bothersome to patient Patient will return if lesions become bothersome or change  ACTINIC KERATOSES   ACTINIC ELASTOSIS   NEOPLASM OF UNCERTAIN BEHAVIOR OF SKIN    Return for follow with Dr Kirtland Bouchard  Sept 3, 2025.  IAngelique Holm, CMA, am acting as scribe for Elie Goody, MD .   Documentation: I have reviewed the above documentation for accuracy and completeness, and I agree with the above.  Elie Goody, MD

## 2023-11-21 ENCOUNTER — Encounter: Payer: Self-pay | Admitting: Podiatry

## 2023-11-21 ENCOUNTER — Ambulatory Visit (INDEPENDENT_AMBULATORY_CARE_PROVIDER_SITE_OTHER): Payer: Medicare Other | Admitting: Podiatry

## 2023-11-21 VITALS — Ht 69.0 in | Wt 180.0 lb

## 2023-11-21 DIAGNOSIS — D689 Coagulation defect, unspecified: Secondary | ICD-10-CM | POA: Diagnosis not present

## 2023-11-21 DIAGNOSIS — B351 Tinea unguium: Secondary | ICD-10-CM

## 2023-11-21 DIAGNOSIS — M79674 Pain in right toe(s): Secondary | ICD-10-CM

## 2023-11-21 DIAGNOSIS — M79675 Pain in left toe(s): Secondary | ICD-10-CM

## 2023-11-21 NOTE — Progress Notes (Signed)
  Subjective:  Patient ID: Darryl Novak, male    DOB: [birth year],  MRN: [id number]  89 y.o. male presents with at risk foot care with h/o coagulation defect and painful, elongated thickened toenails x 10 which are symptomatic when wearing enclosed shoe gear. This interferes with his/her daily activities. Patient is blind and resides at [facility] [place]. He is accompanied by his son on today's visit. Chief Complaint  Patient presents with   Nail Problem    Pt is here for [facility] PCP is Dr Letitia Libra and LOV was in November.    PCP: Gracelyn Nurse, MD.  New problem(s): None.   Review of Systems: Negative except as noted in the HPI.   Allergies  Allergen Reactions   Ace Inhibitors Other (See Comments)    Intolerant    Objective:  There were no vitals filed for this visit. Constitutional Patient is a pleasant 89 y.o. male frail, in NAD. AAO x 3.  Vascular Capillary fill time to digits <3 seconds.  DP/PT pulse(s) are faintly palpable b/l lower extremities. Pedal hair absent b/l. Lower extremity skin temperature gradient warm to warm b/l. No pain with calf compression b/l. No cyanosis or clubbing noted. No ischemia nor gangrene noted b/l.   Neurologic Protective sensation intact 5/5 intact bilaterally with 10g monofilament b/l. Vibratory sensation intact b/l. No clonus b/l.   Dermatologic Pedal skin is thin, shiny and atrophic b/l.  No pressure injuries noted b/l. No open wounds b/l lower extremities. No interdigital macerations b/l lower extremities. Toenails 1-5 b/l elongated, discolored, dystrophic, thickened, crumbly with subungual debris and tenderness to dorsal palpation. No corns, calluses nor porokeratotic lesions noted.  Orthopedic: Normal muscle strength 3/5 to all lower extremity muscle groups bilaterally. Utilizes transport chair for ambulation assistance.   Last HgA1c:      No data to display         Assessment:   1. Pain due to onychomycosis of toenails of both  feet   2. Coagulation disorder (HCC)    Plan:  Patient was evaluated and treated and all questions answered. Consent given for treatment as described below: -Continue soft, supportive shoe gear daily. Report any pedal injuries to medical professional immediately. -Mycotic toenails 1-5 bilaterally were debrided in length and girth with sterile nail nippers and dremel without incident. -Patient/POA to call should there be question/concern in the interim.  Return in about 3 months (around 02/18/2024).  Freddie Breech, DPM      [place] LOCATION: [address], [place] [address]                   Office [phone]   [facility] LOCATION: [address] [place], [place] [address] Office [phone]

## 2024-02-20 ENCOUNTER — Ambulatory Visit: Payer: Medicare Other | Admitting: Podiatry

## 2024-06-02 ENCOUNTER — Ambulatory Visit (INDEPENDENT_AMBULATORY_CARE_PROVIDER_SITE_OTHER): Admitting: Dermatology

## 2024-06-02 DIAGNOSIS — C4442 Squamous cell carcinoma of skin of scalp and neck: Secondary | ICD-10-CM | POA: Diagnosis not present

## 2024-06-02 DIAGNOSIS — L570 Actinic keratosis: Secondary | ICD-10-CM

## 2024-06-02 DIAGNOSIS — L814 Other melanin hyperpigmentation: Secondary | ICD-10-CM

## 2024-06-02 DIAGNOSIS — L578 Other skin changes due to chronic exposure to nonionizing radiation: Secondary | ICD-10-CM | POA: Diagnosis not present

## 2024-06-02 DIAGNOSIS — W908XXA Exposure to other nonionizing radiation, initial encounter: Secondary | ICD-10-CM | POA: Diagnosis not present

## 2024-06-02 DIAGNOSIS — D485 Neoplasm of uncertain behavior of skin: Secondary | ICD-10-CM

## 2024-06-02 DIAGNOSIS — L821 Other seborrheic keratosis: Secondary | ICD-10-CM

## 2024-06-02 DIAGNOSIS — L820 Inflamed seborrheic keratosis: Secondary | ICD-10-CM | POA: Diagnosis not present

## 2024-06-02 DIAGNOSIS — D492 Neoplasm of unspecified behavior of bone, soft tissue, and skin: Secondary | ICD-10-CM

## 2024-06-02 MED ORDER — MUPIROCIN 2 % EX OINT: 1.0000 | TOPICAL_OINTMENT | Freq: Every day | CUTANEOUS | 0 refills | Status: DC

## 2024-06-02 NOTE — Patient Instructions (Addendum)
 Leave pressure dressing on scalp for 2 days.  Once you take off follow wound care instructions below.    Wound Care Instructions  Cleanse wound gently with soap and water once a day then pat dry with clean gauze. Apply a thin coat of Mupirocin  ointment over the wound (unless you have an allergy to this).  Do not pick or remove scabs. Do not remove the yellow or white healing tissue from the base of the wound.  Cover the wound with fresh, clean, nonstick gauze and secure with paper tape. You may use Band-Aids in place of gauze and tape if the wound is small enough, but would recommend trimming much of the tape off as there is often too much. Sometimes Band-Aids can irritate the skin.  You should call the office for your biopsy report after 1 week if you have not already been contacted.  If you experience any problems, such as abnormal amounts of bleeding, swelling, significant bruising, significant pain, or evidence of infection, please call the office immediately.  FOR ADULT SURGERY PATIENTS: If you need something for pain relief you may take 1 extra strength Tylenol  (acetaminophen ) AND 2 Ibuprofen (200mg  each) together every 4 hours as needed for pain. (do not take these if you are allergic to them or if you have a reason you should not take them.) Typically, you may only need pain medication for 1 to 3 days.     Due to recent changes in healthcare laws, you may see results of your pathology and/or laboratory studies on MyChart before the doctors have had a chance to review them. We understand that in some cases there may be results that are confusing or concerning to you. Please understand that not all results are received at the same time and often the doctors may need to interpret multiple results in order to provide you with the best plan of care or course of treatment. Therefore, we ask that you please give us  2 business days to thoroughly review all your results before contacting the  office for clarification. Should we see a critical lab result, you will be contacted sooner.   If You Need Anything After Your Visit  If you have any questions or concerns for your doctor, please call our main line at [phone] and press option 4 to reach your doctor's medical assistant. If no one answers, please leave a voicemail as directed and we will return your call as soon as possible. Messages left after 4 pm will be answered the following business day.   You may also send us  a message via MyChart. We typically respond to MyChart messages within 1-2 business days.  For prescription refills, please ask your pharmacy to contact our office. Our fax number is [phone].  If you have an urgent issue when the clinic is closed that cannot wait until the next business day, you can page your doctor at the number below.    Please note that while we do our best to be available for urgent issues outside of office hours, we are not available 24/7.   If you have an urgent issue and are unable to reach us , you may choose to seek medical care at your doctor's office, retail clinic, urgent care center, or emergency room.  If you have a medical emergency, please immediately call 911 or go to the emergency department.  Pager Numbers  - Dr. Hester: [phone]  - Dr. Jackquline: [phone]  - Dr. Claudene: [phone]   - Dr.  Kitts: [phone]  In the event of inclement weather, please call our main line at [phone] for an update on the status of any delays or closures.  Dermatology Medication Tips: Please keep the boxes that topical medications come in in order to help keep track of the instructions about where and how to use these. Pharmacies typically print the medication instructions only on the boxes and not directly on the medication tubes.   If your medication is too expensive, please contact our office at [phone] option 4 or send us  a message through MyChart.   We are  unable to tell what your co-pay for medications will be in advance as this is different depending on your insurance coverage. However, we may be able to find a substitute medication at lower cost or fill out paperwork to get insurance to cover a needed medication.   If a prior authorization is required to get your medication covered by your insurance company, please allow us  1-2 business days to complete this process.  Drug prices often vary depending on where the prescription is filled and some pharmacies may offer cheaper prices.  The website www.goodrx.com contains coupons for medications through different pharmacies. The prices here do not account for what the cost may be with help from insurance (it may be cheaper with your insurance), but the website can give you the price if you did not use any insurance.  - You can print the associated coupon and take it with your prescription to the pharmacy.  - You may also stop by our office during regular business hours and pick up a GoodRx coupon card.  - If you need your prescription sent electronically to a different pharmacy, notify our office through [facility] or by phone at [phone] option 4.     Si Usted Necesita Algo Despus de Su Visita  Tambin puede enviarnos un mensaje a travs de Clinical cytogeneticist. Por lo general respondemos a los mensajes de MyChart en el transcurso de 1 a 2 das hbiles.  Para renovar recetas, por favor pida a su farmacia que se ponga en contacto con nuestra oficina. Randi lakes de fax es [place] [phone].  Si tiene un asunto urgente cuando la clnica est cerrada y que no puede esperar hasta el siguiente da hbil, puede llamar/localizar a su doctor(a) al nmero que aparece a continuacin.   Por favor, tenga en cuenta que aunque hacemos todo lo posible para estar disponibles para asuntos urgentes fuera del horario de [place], no estamos disponibles las 24 horas del da, los 7 [address]  [address] de la [place].   Si tiene un  problema urgente y no puede comunicarse con nosotros, puede optar por buscar atencin mdica  en el consultorio de su doctor(a), en una clnica privada, en un centro de atencin urgente o en una sala de emergencias.  Si tiene Engineer, drilling, por favor llame inmediatamente al 911 o vaya a la sala de emergencias.  Nmeros de bper  - Dr. Hester: [phone]  - Dra. Jackquline: [phone]  - Dr. Claudene: [phone]  - Dra. Kitts: [phone]  En caso de inclemencias del [place], por favor llame a nuestra lnea principal al [phone] para una actualizacin sobre el estado de cualquier retraso o cierre.  Consejos para la medicacin en dermatologa: Por favor, guarde las cajas en las que vienen los medicamentos de uso tpico para ayudarle a seguir las instrucciones sobre dnde y cmo usarlos. Las farmacias generalmente imprimen las instrucciones del medicamento slo en las cajas y no  directamente en los tubos del medicamento.   Si su medicamento es muy caro, por favor, pngase en contacto con landry rieger llamando al [phone] y presione la opcin 4 o envenos un mensaje a travs de Clinical cytogeneticist.   No podemos decirle cul ser su copago por los medicamentos por adelantado ya que esto es diferente dependiendo de la cobertura de su seguro. Sin embargo, es posible que podamos encontrar un medicamento sustituto a Audiological scientist un formulario para que el seguro cubra el medicamento que se considera necesario.   Si se requiere una autorizacin previa para que su compaa de seguros malta su medicamento, por favor permtanos de 1 a 2 das hbiles para completar este proceso.  Los precios de los medicamentos varan con frecuencia dependiendo del Environmental consultant de dnde se surte la receta y alguna farmacias pueden ofrecer precios ms baratos.  El sitio web www.goodrx.com tiene cupones para medicamentos de Health and safety inspector. Los precios aqu no tienen en cuenta lo que podra costar con la  ayuda del seguro (puede ser ms barato con su seguro), pero el sitio web puede darle el precio si no utiliz Tourist information centre manager.  - Puede imprimir el cupn correspondiente y llevarlo con su receta a la farmacia.  - Tambin puede pasar por nuestra oficina durante el horario de atencin regular y Education officer, museum una tarjeta de cupones de GoodRx.  - Si necesita que su receta se enve electrnicamente a una farmacia diferente, informe a nuestra oficina a travs de MyChart de  o por telfono llamando al [phone] y presione la opcin 4.

## 2024-06-02 NOTE — Progress Notes (Signed)
 Follow-Up Visit   Subjective  Darryl Novak is a 89 y.o. male who presents for the following: check spots face, scalp, one on scalp growing The patient has spots, moles and lesions to be evaluated, some may be new or changing and the patient may have concern these could be cancer.  Patient accompanied by son and daughter who contribute to history.   The following portions of the chart were reviewed this encounter and updated as appropriate: medications, allergies, medical history  Review of Systems:  No other skin or systemic complaints except as noted in HPI or Assessment and Plan.  Objective  Well appearing patient in no apparent distress; mood and affect are within normal limits.    A focused examination was performed of the following areas: Face, scalp  Relevant exam findings are noted in the Assessment and Plan.  at R brow x 1 Stuck on waxy paps with erythema Scalp x 17 (17) Hyperkeratotic scaly macules R crown scalp Hyperkeratotic pap with central crust   Assessment & Plan   INFLAMED SEBORRHEIC KERATOSIS at R brow x 1 Symptomatic, irritating, patient would like treated. Destruction of lesion - at R brow x 1 Complexity: simple   Destruction method: cryotherapy   Informed consent: discussed and consent obtained   Timeout:  patient name, date of birth, surgical site, and procedure verified Lesion destroyed using liquid nitrogen: Yes   Region frozen until ice ball extended beyond lesion: Yes   Outcome: patient tolerated procedure well with no complications   Post-procedure details: wound care instructions given    HYPERTROPHIC ACTINIC KERATOSIS (17) Scalp x 17 (17) Destruction of lesion - Scalp x 17 (17) Complexity: simple   Destruction method: cryotherapy   Informed consent: discussed and consent obtained   Timeout:  patient name, date of birth, surgical site, and procedure verified Lesion destroyed using liquid nitrogen: Yes   Region frozen until ice  ball extended beyond lesion: Yes   Outcome: patient tolerated procedure well with no complications   Post-procedure details: wound care instructions given    NEOPLASM OF SKIN R crown scalp Epidermal / dermal shaving  Lesion diameter (cm):  3.5 Informed consent: discussed and consent obtained   Timeout: patient name, date of birth, surgical site, and procedure verified   Procedure prep:  Patient was prepped and draped in usual sterile fashion Prep type:  Isopropyl alcohol  Anesthesia: the lesion was anesthetized in a standard fashion   Anesthetic:  1% lidocaine  w/ epinephrine  1-100,000 buffered w/ 8.4% NaHCO3 Instrument used: DermaBlade and flexible razor blade   Hemostasis achieved with: pressure, aluminum chloride and electrodesiccation   Outcome: patient tolerated procedure well   Post-procedure details: sterile dressing applied and wound care instructions given   Dressing type: bandage and bacitracin     Destruction of lesion Complexity: extensive   Destruction method: electrodesiccation and curettage   Informed consent: discussed and consent obtained   Timeout:  patient name, date of birth, surgical site, and procedure verified Procedure prep:  Patient was prepped and draped in usual sterile fashion Prep type:  Isopropyl alcohol  Anesthesia: the lesion was anesthetized in a standard fashion   Anesthetic:  1% lidocaine  w/ epinephrine  1-100,000 buffered w/ 8.4% NaHCO3 Curettage performed in three different directions: Yes   Electrodesiccation performed over the curetted area: Yes   Lesion length (cm):  3.5 Lesion width (cm):  3.5 Margin per side (cm):  0.2 Final wound size (cm):  3.9 Hemostasis achieved with:  pressure, aluminum chloride and electrodesiccation Outcome:  patient tolerated procedure well with no complications   Post-procedure details: sterile dressing applied and wound care instructions given   Dressing type: bandage and bacitracin     Specimen 1 - Surgical  pathology Differential Diagnosis: R/O SCC  Check Margins: yes Hyperkeratotic pap with central crust 3.5cm EDC ACTINIC SKIN DAMAGE   LENTIGO   SEBORRHEIC KERATOSIS    ACTINIC DAMAGE - chronic, secondary to cumulative UV radiation exposure/sun exposure over time - diffuse scaly erythematous macules with underlying dyspigmentation - Recommend daily broad spectrum sunscreen SPF 30+ to sun-exposed areas, reapply every 2 hours as needed.  - Recommend staying in the shade or wearing long sleeves, sun glasses (UVA+UVB protection) and wide brim hats (4-inch brim around the entire circumference of the hat). - Call for new or changing lesions.  SEBORRHEIC KERATOSIS - Stuck-on, waxy, tan-brown papules and/or plaques  - Benign-appearing - Discussed benign etiology and prognosis. - Observe - Call for any changes  LENTIGINES Exam: scattered tan macules Due to sun exposure Treatment Plan: Benign-appearing, observe. Recommend daily broad spectrum sunscreen SPF 30+ to sun-exposed areas, reapply every 2 hours as needed.  Call for any changes  Return in about 3 months (around 09/01/2024) for [place] f/u, recheck bx site scalp.  I, Grayce Saunas, RMA, am acting as scribe for Alm Rhyme, MD .   Documentation: I have reviewed the above documentation for accuracy and completeness, and I agree with the above.  Alm Rhyme, MD

## 2024-06-03 ENCOUNTER — Encounter: Payer: Self-pay | Admitting: Dermatology

## 2024-06-09 LAB — SURGICAL PATHOLOGY

## 2024-06-10 ENCOUNTER — Ambulatory Visit: Payer: Self-pay | Admitting: Dermatology

## 2024-06-10 ENCOUNTER — Encounter: Payer: Self-pay | Admitting: Dermatology

## 2024-06-10 NOTE — Telephone Encounter (Signed)
-----   Message from Alm Rhyme sent at 06/10/2024  9:24 AM EDT ----- FINAL DIAGNOSIS        1. 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    Cancer - Severe Poorly differentiated SCC Already treated High risk of recurrence and metastsis due to aggressiveness. Return if evidence of re-growth  Keep December 2025 appt - BUT Make pt appt for 3-5 weeks for additional follow up ----- Message ----- From: Interface, Lab In [facility] [place] [facility] Sent: 06/09/2024   4:34 PM EDT To: Alm JAYSON Rhyme, MD

## 2024-06-10 NOTE — Telephone Encounter (Signed)
 Advised pt's son of bx results.  Scheduled pt for 07/08/24 appointment to recheck scalp./sh

## 2024-07-08 ENCOUNTER — Ambulatory Visit (INDEPENDENT_AMBULATORY_CARE_PROVIDER_SITE_OTHER): Admitting: Dermatology

## 2024-07-08 ENCOUNTER — Encounter: Payer: Self-pay | Admitting: Dermatology

## 2024-07-08 DIAGNOSIS — Z85828 Personal history of other malignant neoplasm of skin: Secondary | ICD-10-CM | POA: Diagnosis not present

## 2024-07-08 DIAGNOSIS — L578 Other skin changes due to chronic exposure to nonionizing radiation: Secondary | ICD-10-CM | POA: Diagnosis not present

## 2024-07-08 DIAGNOSIS — L570 Actinic keratosis: Secondary | ICD-10-CM | POA: Diagnosis not present

## 2024-07-08 DIAGNOSIS — L820 Inflamed seborrheic keratosis: Secondary | ICD-10-CM | POA: Diagnosis not present

## 2024-07-08 DIAGNOSIS — W908XXA Exposure to other nonionizing radiation, initial encounter: Secondary | ICD-10-CM

## 2024-07-08 DIAGNOSIS — Z8589 Personal history of malignant neoplasm of other organs and systems: Secondary | ICD-10-CM

## 2024-07-08 DIAGNOSIS — L821 Other seborrheic keratosis: Secondary | ICD-10-CM | POA: Diagnosis not present

## 2024-07-08 NOTE — Patient Instructions (Addendum)
 Okay to clean biopsy site of the scalp with gentle soap and warm water daily. Apply Mupirocin  daily and leave uncovered.    Due to recent changes in healthcare laws, you may see results of your pathology and/or laboratory studies on MyChart before the doctors have had a chance to review them. We understand that in some cases there may be results that are confusing or concerning to you. Please understand that not all results are received at the same time and often the doctors may need to interpret multiple results in order to provide you with the best plan of care or course of treatment. Therefore, we ask that you please give us  2 business days to thoroughly review all your results before contacting the office for clarification. Should we see a critical lab result, you will be contacted sooner.   If You Need Anything After Your Visit  If you have any questions or concerns for your doctor, please call our main line at [phone] and press option 4 to reach your doctor's medical assistant. If no one answers, please leave a voicemail as directed and we will return your call as soon as possible. Messages left after 4 pm will be answered the following business day.   You may also send us  a message via MyChart. We typically respond to MyChart messages within 1-2 business days.  For prescription refills, please ask your pharmacy to contact our office. Our fax number is [phone].  If you have an urgent issue when the clinic is closed that cannot wait until the next business day, you can page your doctor at the number below.    Please note that while we do our best to be available for urgent issues outside of office hours, we are not available 24/7.   If you have an urgent issue and are unable to reach us , you may choose to seek medical care at your doctor's office, retail clinic, urgent care center, or emergency room.  If you have a medical emergency, please immediately call 911 or go to the  emergency department.  Pager Numbers  - Dr. Hester: [phone]  - Dr. Jackquline: [phone]  - Dr. Claudene: [phone]   - Dr. Raymund: [phone]  In the event of inclement weather, please call our main line at [phone] for an update on the status of any delays or closures.  Dermatology Medication Tips: Please keep the boxes that topical medications come in in order to help keep track of the instructions about where and how to use these. Pharmacies typically print the medication instructions only on the boxes and not directly on the medication tubes.   If your medication is too expensive, please contact our office at [phone] option 4 or send us  a message through MyChart.   We are unable to tell what your co-pay for medications will be in advance as this is different depending on your insurance coverage. However, we may be able to find a substitute medication at lower cost or fill out paperwork to get insurance to cover a needed medication.   If a prior authorization is required to get your medication covered by your insurance company, please allow us  1-2 business days to complete this process.  Drug prices often vary depending on where the prescription is filled and some pharmacies may offer cheaper prices.  The website www.goodrx.com contains coupons for medications through different pharmacies. The prices here do not account for what the cost may be with help from insurance (it may be cheaper  with your insurance), but the website can give you the price if you did not use any insurance.  - You can print the associated coupon and take it with your prescription to the pharmacy.  - You may also stop by our office during regular business hours and pick up a GoodRx coupon card.  - If you need your prescription sent electronically to a different pharmacy, notify our office through [facility] or by phone at [phone] option 4.     Si Usted Necesita Algo Despus de  Su Visita  Tambin puede enviarnos un mensaje a travs de Clinical cytogeneticist. Por lo general respondemos a los mensajes de MyChart en el transcurso de 1 a 2 das hbiles.  Para renovar recetas, por favor pida a su farmacia que se ponga en contacto con nuestra oficina. Randi lakes de fax es [place] [phone].  Si tiene un asunto urgente cuando la clnica est cerrada y que no puede esperar hasta el siguiente da hbil, puede llamar/localizar a su doctor(a) al nmero que aparece a continuacin.   Por favor, tenga en cuenta que aunque hacemos todo lo posible para estar disponibles para asuntos urgentes fuera del horario de [place], no estamos disponibles las 24 horas del da, los 7 [address]  [address] de la [place].   Si tiene un problema urgente y no puede comunicarse con nosotros, puede optar por buscar atencin mdica  en el consultorio de su doctor(a), en una clnica privada, en un centro de atencin urgente o en una sala de emergencias.  Si tiene Engineer, drilling, por favor llame inmediatamente al 911 o vaya a la sala de emergencias.  Nmeros de bper  - Dr. Hester: [phone]  - Dra. Jackquline: [phone]  - Dr. Claudene: [phone]  - Dra. Kitts: [phone]  En caso de inclemencias del [place], por favor llame a nuestra lnea principal al [phone] para una actualizacin sobre el estado de cualquier retraso o cierre.  Consejos para la medicacin en dermatologa: Por favor, guarde las cajas en las que vienen los medicamentos de uso tpico para ayudarle a seguir las instrucciones sobre dnde y cmo usarlos. Las farmacias generalmente imprimen las instrucciones del medicamento slo en las cajas y no directamente en los tubos del [place].   Si su medicamento es muy caro, por favor, pngase en contacto con landry rieger llamando al [phone] y presione la opcin 4 o envenos un mensaje a travs de Clinical cytogeneticist.   No podemos decirle cul ser su copago por los medicamentos por adelantado ya que  esto es diferente dependiendo de la cobertura de su seguro. Sin embargo, es posible que podamos encontrar un medicamento sustituto a Audiological scientist un formulario para que el seguro cubra el medicamento que se considera necesario.   Si se requiere una autorizacin previa para que su compaa de seguros malta su medicamento, por favor permtanos de 1 a 2 das hbiles para completar este proceso.  Los precios de los medicamentos varan con frecuencia dependiendo del Environmental consultant de dnde se surte la receta y alguna farmacias pueden ofrecer precios ms baratos.  El sitio web www.goodrx.com tiene cupones para medicamentos de Health and safety inspector. Los precios aqu no tienen en cuenta lo que podra costar con la ayuda del seguro (puede ser ms barato con su seguro), pero el sitio web puede darle el precio si no utiliz Tourist information centre manager.  - Puede imprimir el cupn correspondiente y llevarlo con su receta a la farmacia.  - Tambin puede pasar por nuestra oficina durante el horario de  atencin regular y Education officer, museum una tarjeta de cupones de GoodRx.  - Si necesita que su receta se enve electrnicamente a una farmacia diferente, informe a nuestra oficina a travs de MyChart de [place] o por telfono llamando al [phone] y presione la opcin 4.

## 2024-07-08 NOTE — Progress Notes (Signed)
 Follow-Up Visit   Subjective  Darryl Novak is a 89 y.o. male who presents for the following: Recheck SCC of the scalp The patient has spots, moles and lesions to be evaluated, some may be new or changing and the patient may have concern these could be cancer.  The following portions of the chart were reviewed this encounter and updated as appropriate: medications, allergies, medical history  Review of Systems:  No other skin or systemic complaints except as noted in HPI or Assessment and Plan.  Objective  Well appearing patient in no apparent distress; mood and affect are within normal limits.  A focused examination was performed of the following areas: Scalp  Relevant exam findings are noted in the Assessment and Plan.  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 Right crown scalp biopsy and EDC 06/02/24.  Advised pt and son today of high risk of recurrence and spread of this cancer. The area is healing extremely well today without evidence of recurrence by visual exam or palpation.  There is no lymphadenopathy palpated today. - No evidence of recurrence today; wound healing well. - Reviewed with patient's son that this was a very aggressive diagnosis and there is a high likely hood of recurrence.  - No lymphadenopathy - Recommend regular full body skin exams - Recommend daily broad spectrum sunscreen SPF 30+ to sun-exposed areas, reapply every 2 hours as needed.  - Call if any new or changing lesions are noted between office visits  ACTINIC DAMAGE - chronic, secondary to cumulative UV radiation exposure/sun exposure over time - diffuse scaly erythematous macules with underlying dyspigmentation - Recommend daily broad spectrum sunscreen SPF 30+ to sun-exposed areas, reapply every 2 hours as needed.  - Recommend staying in the shade or wearing long sleeves, sun glasses  (UVA+UVB protection) and wide brim hats (4-inch brim around the entire circumference of the hat). - Call for new or changing lesions.  SEBORRHEIC KERATOSIS - Stuck-on, waxy, tan-brown papules and/or plaques  - Benign-appearing - Discussed benign etiology and prognosis. - Observe - Call for any changes  HYPERTROPHIC ACTINIC KERATOSIS (7) Right forearm x1, left forearm x5, left ckeek x1 (7) Destruction of lesion - Right forearm x1, left forearm x5, left ckeek x1 (7) Complexity: simple   Destruction method: cryotherapy   Informed consent: discussed and consent obtained   Timeout:  patient name, date of birth, surgical site, and procedure verified Lesion destroyed using liquid nitrogen: Yes   Region frozen until ice ball extended beyond lesion: Yes   Outcome: patient tolerated procedure well with no complications   Post-procedure details: wound care instructions given    INFLAMED SEBORRHEIC KERATOSIS Right Forearm x1 Symptomatic, irritating, patient would like treated. Destruction of lesion - Right Forearm x1 Complexity: simple   Destruction method: cryotherapy   Informed consent: discussed and consent obtained   Timeout:  patient name, date of birth, surgical site, and procedure verified Lesion destroyed using liquid nitrogen: Yes   Region frozen until ice ball extended beyond lesion: Yes   Outcome: patient tolerated procedure well with no complications   Post-procedure details: wound care instructions given    HISTORY OF SQUAMOUS CELL CARCINOMA   ACTINIC SKIN DAMAGE    Return for Appointment as scheduled.  I, Emerick Ege, CMA am acting as scribe for Alm Rhyme, MD   Documentation: I have reviewed the above documentation for accuracy and completeness,  and I agree with the above.  Alm Rhyme, MD

## 2024-09-01 ENCOUNTER — Encounter: Payer: Self-pay | Admitting: Dermatology

## 2024-09-01 ENCOUNTER — Ambulatory Visit: Admitting: Dermatology

## 2024-09-01 DIAGNOSIS — C44329 Squamous cell carcinoma of skin of other parts of face: Secondary | ICD-10-CM

## 2024-09-01 DIAGNOSIS — L578 Other skin changes due to chronic exposure to nonionizing radiation: Secondary | ICD-10-CM

## 2024-09-01 DIAGNOSIS — L820 Inflamed seborrheic keratosis: Secondary | ICD-10-CM

## 2024-09-01 DIAGNOSIS — C44320 Squamous cell carcinoma of skin of unspecified parts of face: Secondary | ICD-10-CM

## 2024-09-01 DIAGNOSIS — W908XXA Exposure to other nonionizing radiation, initial encounter: Secondary | ICD-10-CM | POA: Diagnosis not present

## 2024-09-01 DIAGNOSIS — D492 Neoplasm of unspecified behavior of bone, soft tissue, and skin: Secondary | ICD-10-CM | POA: Diagnosis not present

## 2024-09-01 DIAGNOSIS — Z8589 Personal history of malignant neoplasm of other organs and systems: Secondary | ICD-10-CM

## 2024-09-01 DIAGNOSIS — L570 Actinic keratosis: Secondary | ICD-10-CM

## 2024-09-01 DIAGNOSIS — C4492 Squamous cell carcinoma of skin, unspecified: Secondary | ICD-10-CM

## 2024-09-01 HISTORY — DX: Squamous cell carcinoma of skin, unspecified: C44.92

## 2024-09-01 NOTE — Progress Notes (Unsigned)
 Follow-Up Visit   Subjective  Darryl Novak is a 89 y.o. male who presents for the following: 8 week AK recheck. Recheck SCC site on right crown scalp. Lesion on right eyebrow.   The patient has spots, moles and lesions to be evaluated, some may be new or changing and the patient may have concern these could be cancer.  Son, Darryl Novak, is with patient and contributes to history.    The following portions of the chart were reviewed this encounter and updated as appropriate: medications, allergies, medical history  Review of Systems:  No other skin or systemic complaints except as noted in HPI or Assessment and Plan.  Objective  Well appearing patient in no apparent distress; mood and affect are within normal limits.  A focused examination was performed of the following areas: Scalp, face, ears Relevant physical exam findings are noted in the Assessment and Plan.  Scalp x17 (17) Erythematous thin papules/macules with gritty scale.  Scalp x5 (5) Erythematous keratotic or waxy stuck-on papule or plaque. Right Eyebrow 2.2 cm hyperkeratotic scaly plaque   Assessment & Plan   ACTINIC DAMAGE - chronic, secondary to cumulative UV radiation exposure/sun exposure over time - diffuse scaly erythematous macules with underlying dyspigmentation - Recommend daily broad spectrum sunscreen SPF 30+ to sun-exposed areas, reapply every 2 hours as needed.  - Recommend staying in the shade or wearing long sleeves, sun glasses (UVA+UVB protection) and wide brim hats (4-inch brim around the entire circumference of the hat). - Call for new or changing lesions.   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 Right crown scalp biopsy and EDC 06/02/24.  Advised pt and son today of high risk of recurrence and spread of this cancer. The area is healing extremely well today without evidence of recurrence by visual exam or palpation.  There is no lymphadenopathy palpated today. -  No evidence of recurrence today; wound healing well. - Reviewed with patient's son that this was a very aggressive diagnosis and there is a high likely hood of recurrence.  - No lymphadenopathy - Recommend regular full body skin exams - Recommend daily broad spectrum sunscreen SPF 30+ to sun-exposed areas, reapply every 2 hours as needed.  - Call if any new or changing lesions are noted between office visits AK (ACTINIC KERATOSIS) (17) Scalp x17 (17) Actinic keratoses are precancerous spots that appear secondary to cumulative UV radiation exposure/sun exposure over time. They are chronic with expected duration over 1 year. A portion of actinic keratoses will progress to squamous cell carcinoma of the skin. It is not possible to reliably predict which spots will progress to skin cancer and so treatment is recommended to prevent development of skin cancer.  Recommend daily broad spectrum sunscreen SPF 30+ to sun-exposed areas, reapply every 2 hours as needed.  Recommend staying in the shade or wearing long sleeves, sun glasses (UVA+UVB protection) and wide brim hats (4-inch brim around the entire circumference of the hat). Call for new or changing lesions. Destruction of lesion - Scalp x17 (17) Complexity: simple   Destruction method: cryotherapy   Informed consent: discussed and consent obtained   Timeout:  patient name, date of birth, surgical site, and procedure verified Lesion destroyed using liquid nitrogen: Yes   Region frozen until ice ball extended beyond lesion: Yes   Outcome: patient tolerated procedure well with no complications   Post-procedure details: wound care instructions given   Additional details:  Prior to procedure, discussed risks of blister formation,  small wound, skin dyspigmentation, or rare scar following cryotherapy. Recommend Vaseline ointment to treated areas while healing.   INFLAMED SEBORRHEIC KERATOSIS (5) Scalp x5 (5) Symptomatic, irritating, patient would  like treated. Destruction of lesion - Scalp x5 (5) Complexity: simple   Destruction method: cryotherapy   Informed consent: discussed and consent obtained   Timeout:  patient name, date of birth, surgical site, and procedure verified Lesion destroyed using liquid nitrogen: Yes   Region frozen until ice ball extended beyond lesion: Yes   Outcome: patient tolerated procedure well with no complications   Post-procedure details: wound care instructions given   Additional details:  Prior to procedure, discussed risks of blister formation, small wound, skin dyspigmentation, or rare scar following cryotherapy. Recommend Vaseline ointment to treated areas while healing.   NEOPLASM OF SKIN Right Eyebrow Epidermal / dermal shaving  Lesion diameter (cm):  2.2 Informed consent: discussed and consent obtained   Timeout: patient name, date of birth, surgical site, and procedure verified   Procedure prep:  Patient was prepped and draped in usual sterile fashion Prep type:  Isopropyl alcohol  Anesthesia: the lesion was anesthetized in a standard fashion   Anesthetic:  1% lidocaine  w/ epinephrine  1-100,000 buffered w/ 8.4% NaHCO3 Instrument used: flexible razor blade   Hemostasis achieved with: pressure, aluminum chloride and electrodesiccation   Outcome: patient tolerated procedure well   Post-procedure details: sterile dressing applied and wound care instructions given   Dressing type: bandage and petrolatum    Destruction of lesion Complexity: extensive   Destruction method: electrodesiccation and curettage   Informed consent: discussed and consent obtained   Timeout:  patient name, date of birth, surgical site, and procedure verified Procedure prep:  Patient was prepped and draped in usual sterile fashion Prep type:  Isopropyl alcohol  Anesthesia: the lesion was anesthetized in a standard fashion   Anesthetic:  1% lidocaine  w/ epinephrine  1-100,000 buffered w/ 8.4% NaHCO3 Curettage performed  in three different directions: Yes   Electrodesiccation performed over the curetted area: Yes   Curettage cycles:  3 Lesion length (cm):  2.2 Lesion width (cm):  2.2 Margin per side (cm):  0.2 Final wound size (cm):  2.6 Hemostasis achieved with:  pressure and aluminum chloride Outcome: patient tolerated procedure well with no complications   Post-procedure details: sterile dressing applied and wound care instructions given   Dressing type: bandage and petrolatum    Specimen 1 - Surgical pathology Differential Diagnosis: R/O SCC  Check Margins: No  EDC today   Return in about 4 months (around 12/31/2024) for AK Follow Up, Biopsy Follow Up.  I, Kate Fought, CMA, am acting as scribe for Alm Rhyme, MD.   Documentation: I have reviewed the above documentation for accuracy and completeness, and I agree with the above.  Alm Rhyme, MD

## 2024-09-01 NOTE — Patient Instructions (Signed)
 Cryotherapy Aftercare  Wash gently with soap and water everyday.   Apply Vaseline Jelly daily until healed.     Wound Care Instructions  Cleanse wound gently with soap and water once a day then pat dry with clean gauze. Apply a thin coat of Petrolatum (petroleum jelly, Vaseline) over the wound (unless you have an allergy to this). We recommend that you use a new, sterile tube of Vaseline. Do not pick or remove scabs. Do not remove the yellow or white healing tissue from the base of the wound.  Cover the wound with fresh, clean, nonstick gauze and secure with paper tape. You may use Band-Aids in place of gauze and tape if the wound is small enough, but would recommend trimming much of the tape off as there is often too much. Sometimes Band-Aids can irritate the skin.  You should call the office for your biopsy report after 1 week if you have not already been contacted.  If you experience any problems, such as abnormal amounts of bleeding, swelling, significant bruising, significant pain, or evidence of infection, please call the office immediately.  FOR ADULT SURGERY PATIENTS: If you need something for pain relief you may take 1 extra strength Tylenol  (acetaminophen ) AND 2 Ibuprofen (200mg  each) together every 4 hours as needed for pain. (do not take these if you are allergic to them or if you have a reason you should not take them.) Typically, you may only need pain medication for 1 to 3 days.       Due to recent changes in healthcare laws, you may see results of your pathology and/or laboratory studies on MyChart before the doctors have had a chance to review them. We understand that in some cases there may be results that are confusing or concerning to you. Please understand that not all results are received at the same time and often the doctors may need to interpret multiple results in order to provide you with the best plan of care or course of treatment. Therefore, we ask that you  please give us  2 business days to thoroughly review all your results before contacting the office for clarification. Should we see a critical lab result, you will be contacted sooner.   If You Need Anything After Your Visit  If you have any questions or concerns for your doctor, please call our main line at [phone] and press option 4 to reach your doctor's medical assistant. If no one answers, please leave a voicemail as directed and we will return your call as soon as possible. Messages left after 4 pm will be answered the following business day.   You may also send us  a message via MyChart. We typically respond to MyChart messages within 1-2 business days.  For prescription refills, please ask your pharmacy to contact our office. Our fax number is [phone].  If you have an urgent issue when the clinic is closed that cannot wait until the next business day, you can page your doctor at the number below.    Please note that while we do our best to be available for urgent issues outside of office hours, we are not available 24/7.   If you have an urgent issue and are unable to reach us , you may choose to seek medical care at your doctor's office, retail clinic, urgent care center, or emergency room.  If you have a medical emergency, please immediately call 911 or go to the emergency department.  Pager Numbers  - Dr. Hester:  [phone]  - Dr. Jackquline: [phone]  - Dr. Claudene: [phone]   - Dr. Raymund: [phone]  In the event of inclement weather, please call our main line at [phone] for an update on the status of any delays or closures.  Dermatology Medication Tips: Please keep the boxes that topical medications come in in order to help keep track of the instructions about where and how to use these. Pharmacies typically print the medication instructions only on the boxes and not directly on the medication tubes.   If your medication is too expensive, please  contact our office at [phone] option 4 or send us  a message through MyChart.   We are unable to tell what your co-pay for medications will be in advance as this is different depending on your insurance coverage. However, we may be able to find a substitute medication at lower cost or fill out paperwork to get insurance to cover a needed medication.   If a prior authorization is required to get your medication covered by your insurance company, please allow us  1-2 business days to complete this process.  Drug prices often vary depending on where the prescription is filled and some pharmacies may offer cheaper prices.  The website www.goodrx.com contains coupons for medications through different pharmacies. The prices here do not account for what the cost may be with help from insurance (it may be cheaper with your insurance), but the website can give you the price if you did not use any insurance.  - You can print the associated coupon and take it with your prescription to the pharmacy.  - You may also stop by our office during regular business hours and pick up a GoodRx coupon card.  - If you need your prescription sent electronically to a different pharmacy, notify our office through [facility] or by phone at [phone] option 4.     Si Usted Necesita Algo Despus de Su Visita  Tambin puede enviarnos un mensaje a travs de Clinical Cytogeneticist. Por lo general respondemos a los mensajes de MyChart en el transcurso de 1 a 2 das hbiles.  Para renovar recetas, por favor pida a su farmacia que se ponga en contacto con nuestra oficina. Randi lakes de fax es [place] [phone].  Si tiene un asunto urgente cuando la clnica est cerrada y que no puede esperar hasta el siguiente da hbil, puede llamar/localizar a su doctor(a) al nmero que aparece a continuacin.   Por favor, tenga en cuenta que aunque hacemos todo lo posible para estar disponibles para asuntos urgentes fuera del horario de  [place], no estamos disponibles las 24 horas del da, los 7 [address]  [phone] de la [place].   Si tiene un problema urgente y no puede comunicarse con nosotros, puede optar por buscar atencin mdica  en el consultorio de su doctor(a), en una clnica privada, en un centro de atencin urgente o en una sala de emergencias.  Si tiene engineer, drilling, por favor llame inmediatamente al 911 o vaya a la sala de emergencias.  Nmeros de bper  - Dr. Hester: [phone]  - Dra. Jackquline: [phone]  - Dr. Claudene: [phone]  - Dra. Kitts: [phone]  En caso de inclemencias del [place], por favor llame a nuestra lnea principal al [phone] para una actualizacin sobre el estado de cualquier retraso o cierre.  Consejos para la medicacin en dermatologa: Por favor, guarde las cajas en las que vienen los medicamentos de uso tpico para ayudarle a seguir las instrucciones sobre dnde y cmo  usarlos. Las farmacias generalmente imprimen las instrucciones del medicamento slo en las cajas y no directamente en los tubos del [place].   Si su medicamento es muy caro, por favor, pngase en contacto con landry rieger llamando al [phone] y presione la opcin 4 o envenos un mensaje a travs de Clinical Cytogeneticist.   No podemos decirle cul ser su copago por los medicamentos por adelantado ya que esto es diferente dependiendo de la cobertura de su seguro. Sin embargo, es posible que podamos encontrar un medicamento sustituto a audiological scientist un formulario para que el seguro cubra el medicamento que se considera necesario.   Si se requiere una autorizacin previa para que su compaa de seguros cubra su medicamento, por favor permtanos de 1 a 2 das hbiles para completar este proceso.  Los precios de los medicamentos varan con frecuencia dependiendo del environmental consultant de dnde se surte la receta y alguna farmacias pueden ofrecer precios ms baratos.  El sitio web www.goodrx.com tiene cupones para  medicamentos de health and safety inspector. Los precios aqu no tienen en cuenta lo que podra costar con la ayuda del seguro (puede ser ms barato con su seguro), pero el sitio web puede darle el precio si no utiliz tourist information centre manager.  - Puede imprimir el cupn correspondiente y llevarlo con su receta a la farmacia.  - Tambin puede pasar por nuestra oficina durante el horario de atencin regular y education officer, museum una tarjeta de cupones de GoodRx.  - Si necesita que su receta se enve electrnicamente a una farmacia diferente, informe a nuestra oficina a travs de MyChart de [place] o por telfono llamando al [phone] y presione la opcin 4.

## 2024-09-02 LAB — SURGICAL PATHOLOGY

## 2024-09-06 ENCOUNTER — Ambulatory Visit: Payer: Self-pay | Admitting: Dermatology

## 2024-09-06 ENCOUNTER — Encounter: Payer: Self-pay | Admitting: Dermatology

## 2024-09-06 NOTE — Telephone Encounter (Addendum)
 Called and discussed bx results with patient. He verbalized understanding and denied further questions. Will recheck at next followup----- Message from Alm Rhyme sent at 09/06/2024  5:22 PM EST ----- FINAL DIAGNOSIS        1. Skin, right eyebrow :       WELL DIFFERENTIATED SQUAMOUS CELL CARCINOMA, ACANTHOLYTIC (ADENOID) VARIANT    Cancer = SCC Well differentiated Already treated Recheck next visit ----- Message ----- From: Interface, Lab In Three Zero One Sent: 09/02/2024   4:26 PM EST To: Alm JAYSON Rhyme, MD

## 2024-09-07 ENCOUNTER — Encounter: Payer: Self-pay | Admitting: Dermatology

## 2024-09-08 ENCOUNTER — Inpatient Hospital Stay: Attending: Oncology | Admitting: Oncology

## 2024-09-08 ENCOUNTER — Inpatient Hospital Stay

## 2024-09-08 ENCOUNTER — Encounter: Payer: Self-pay | Admitting: Oncology

## 2024-09-08 VITALS — BP 118/84 | HR 85 | Temp 98.1°F | Resp 16 | Wt 140.8 lb

## 2024-09-08 DIAGNOSIS — Z85828 Personal history of other malignant neoplasm of skin: Secondary | ICD-10-CM | POA: Diagnosis not present

## 2024-09-08 DIAGNOSIS — D696 Thrombocytopenia, unspecified: Secondary | ICD-10-CM

## 2024-09-08 LAB — CBC (CANCER CENTER ONLY)
HCT: 46.3 % (ref 39.0–52.0)
Hemoglobin: 15.4 g/dL (ref 13.0–17.0)
MCH: 32.6 pg (ref 26.0–34.0)
MCHC: 33.3 g/dL (ref 30.0–36.0)
MCV: 97.9 fL (ref 80.0–100.0)
Platelet Count: 73 K/uL — ABNORMAL LOW (ref 150–400)
RBC: 4.73 MIL/uL (ref 4.22–5.81)
RDW: 13.7 % (ref 11.5–15.5)
WBC Count: 6.4 K/uL (ref 4.0–10.5)
nRBC: 0 % (ref 0.0–0.2)

## 2024-09-08 LAB — IRON AND TIBC
Iron: 102 ug/dL (ref 45–182)
Saturation Ratios: 32 % (ref 17.9–39.5)
TIBC: 323 ug/dL (ref 250–450)
UIBC: 221 ug/dL

## 2024-09-08 LAB — VITAMIN B12: Vitamin B-12: 589 pg/mL (ref 180–914)

## 2024-09-08 LAB — FERRITIN: Ferritin: 121 ng/mL (ref 24–336)

## 2024-09-08 LAB — FOLATE: Folate: >20 ng/mL (ref >5.9)

## 2024-09-08 NOTE — Progress Notes (Signed)
 [facility]  [phone] Fax:(336) [phone]  ID: Carlin CHRISTELLA Ober OB: [birth year]  MR#: [id number]  RDW#:[id number]  Patient Care Team: Rudolpho Norleen BIRCH, MD as PCP - General (Internal Medicine) Diona Terrilyn) Laurita as Consulting Physician (Ophthalmology) Jacobo Evalene PARAS, MD as Consulting Physician (Oncology)  CHIEF COMPLAINT: Thrombocytopenia.  INTERVAL HISTORY: Patient is a 89 year old male who is noted to have a slowly declining platelet count and is referred for further evaluation.  He is accompanied by his son who gives much of the history.  Patient is blind.  He has no neurologic complaints.  He denies any recent fevers or illnesses.  He has no new medications.  He denies any easy bleeding or bruising.  Patient's son reports he had several squamous cell carcinomas removed from the scalp without excessive bleeding.  He has good appetite and denies weight loss.  He has no chest pain, shortness of breath, cough, or hemoptysis.  He denies any nausea, vomiting, constipation, or diarrhea.  He has no urinary complaints.  Patient offers no further specific complaints today.    REVIEW OF SYSTEMS:   Review of Systems  Constitutional: Negative.  Negative for fever, malaise/fatigue and weight loss.  Respiratory: Negative.  Negative for cough, hemoptysis and shortness of breath.   Cardiovascular: Negative.  Negative for chest pain and leg swelling.  Gastrointestinal: Negative.  Negative for abdominal pain.  Genitourinary: Negative.  Negative for dysuria.  Musculoskeletal: Negative.  Negative for back pain.  Skin: Negative.  Negative for rash.  Neurological: Negative.  Negative for dizziness, focal weakness, weakness and headaches.  Endo/Heme/Allergies:  Does not bruise/bleed easily.  Psychiatric/Behavioral: Negative.  The patient is not nervous/anxious.     As per HPI. Otherwise, a complete review of systems is negative.  PAST MEDICAL HISTORY: Past Medical  History:  Diagnosis Date   Heart trouble    Hypertension    MI (myocardial infarction) (HCC) 1978   Neuropathy    SCC (squamous cell carcinoma) 09/01/2024   right eyebrow - treated with ED&C - recheck at next followup   Shingles    Squamous cell carcinoma of skin 09/05/2010   Left lat. neck infra-auricular. WD SCC. EDC   Squamous cell carcinoma of skin 08/18/2013   Right medial distal dorsum forearm. KA pattern. EDC   Squamous cell carcinoma of skin 10/03/2016   Left inferior sideburn. SCCis, hypertrophic. EDC 01/30/2017   Squamous cell carcinoma of skin 04/08/2018   Left forearm. WD SCC. Excised 05/06/2018, margins free.   Squamous cell carcinoma of skin 04/08/2018   Right upper arm. SCCis, hypertrophic.   Squamous cell carcinoma of skin 02/17/2019   Mid vertex scalp. SCCis. EDC.   Squamous cell carcinoma of skin 05/28/2021   left forearm - EDC   Squamous cell carcinoma of skin 10/31/2022   R eyebrow - SCC, ED&C   Squamous cell carcinoma of skin 06/02/2024   R crown scalp, EDC, recheck on f/u   Stroke (HCC)    Ulcer     PAST SURGICAL HISTORY: Past Surgical History:  Procedure Laterality Date   Carotid artery endarterectomy      FAMILY HISTORY: Family History  Problem Relation Age of Onset   Heart attack Father     ADVANCED DIRECTIVES (Y/N):  N  HEALTH MAINTENANCE: Social History   Tobacco Use   Smoking status: Former    Types: Cigarettes    Start date: 1974   Smokeless tobacco: Never   Tobacco comments:    quit 45 years  ago  Vaping Use   Vaping status: Never Used  Substance Use Topics   Alcohol  use: Yes    Comment: twice a month, a glass of wine   Drug use: No     Colonoscopy:  PAP:  Bone density:  Lipid panel:  Allergies  Allergen Reactions   Ace Inhibitors Other (See Comments)    Intolerant    Current Outpatient Medications  Medication Sig Dispense Refill   acetaminophen  (TYLENOL ) 325 MG tablet Take 325 mg by mouth every 6 (six) hours as  needed for mild pain or moderate pain.     albuterol  (VENTOLIN  HFA) 108 (90 Base) MCG/ACT inhaler Inhale 2 puffs into the lungs every 6 (six) hours as needed for wheezing or shortness of breath.     Alpha-Lipoic Acid 100 MG CAPS Take 100 mg by mouth daily.     apixaban  (ELIQUIS ) 5 MG TABS tablet Take 5 mg by mouth 2 (two) times daily.      Biotin  5000 MCG CAPS Take 5,000 mcg by mouth daily.     carboxymethylcellulose (REFRESH PLUS) 0.5 % SOLN Place 1 drop into both eyes daily.     cetirizine (ZYRTEC) 10 MG tablet Take 10 mg by mouth daily.     cholecalciferol  (VITAMIN D) 400 units TABS tablet Take 400 Units by mouth daily.     Coenzyme Q10 (CO Q-10) 100 MG CAPS Take 100 mg by mouth daily.      diphenhydrAMINE (BENADRYL) 25 mg capsule Take 50 mg by mouth at bedtime as needed for itching.     fluorouracil  (EFUDEX ) 5 % cream Apply topically 2 (two) times daily. Apply 7 days to scalp 15 g 1   furosemide  (LASIX ) 20 MG tablet Take 20 mg by mouth daily.     gabapentin  (NEURONTIN ) 600 MG tablet Take 600 mg by mouth 2 (two) times daily.     hydrocortisone  cream 1 % Apply 1 Application topically 2 (two) times daily. Apply to irritated areas where topical chemotherapy cream was used QD-BID PRN. 30 g 0   loperamide (IMODIUM) 2 MG capsule Take 2 mg by mouth every 4 (four) hours as needed for diarrhea or loose stools.     meclizine  (ANTIVERT ) 25 MG tablet Take 25 mg by mouth every 8 (eight) hours as needed for dizziness.     Multiple Vitamin (MULTIVITAMIN) tablet Take 1 tablet by mouth daily.     mupirocin  ointment (BACTROBAN ) 2 % Apply 1 Application topically daily. qd to wound on scalp until healed 22 g 0   potassium chloride  (KLOR-CON ) 10 MEQ tablet Take 10 mEq by mouth daily.     prednisoLONE acetate (PRED FORTE) 1 % ophthalmic suspension Place 1 drop into the left eye 2 (two) times daily.     simvastatin  (ZOCOR ) 40 MG tablet Take 40 mg by mouth daily.     traZODone  (DESYREL ) 100 MG tablet Take 100 mg by  mouth at bedtime.     No current facility-administered medications for this visit.    OBJECTIVE: Vitals:   09/08/24 1322  BP: 118/84  Pulse: 85  Resp: 16  Temp: 98.1 F (36.7 C)  SpO2: 97%     Body mass index is 20.79 kg/m.    ECOG FS:0 - Asymptomatic  General: Well-developed, well-nourished, no acute distress. Eyes: Pink conjunctiva, anicteric sclera. HEENT: Normocephalic, moist mucous membranes. Lungs: No audible wheezing or coughing. Heart: Regular rate and rhythm. Abdomen: Soft, nontender, no obvious distention. Musculoskeletal: No edema, cyanosis, or clubbing. Neuro: Alert,  answering all questions appropriately. Cranial nerves grossly intact. Skin: No rashes or petechiae noted. Psych: Normal affect. Lymphatics: No cervical, calvicular, axillary or inguinal LAD.   LAB RESULTS:  Lab Results  Component Value Date   NA 132 (L) 03/02/2023   K 3.2 (L) 03/02/2023   CL 100 03/02/2023   CO2 23 03/02/2023   GLUCOSE 98 03/02/2023   BUN 11 03/02/2023   CREATININE 1.00 03/02/2023   CALCIUM 8.1 (L) 03/02/2023   PROT 7.7 03/01/2023   ALBUMIN 3.8 03/01/2023   AST 40 03/01/2023   ALT 11 03/01/2023   ALKPHOS 59 03/01/2023   BILITOT 1.2 03/01/2023   GFRNONAA >60 03/02/2023   GFRAA 43 (L) 06/27/2020    Lab Results  Component Value Date   WBC 6.4 09/08/2024   NEUTROABS 4.5 07/10/2022   HGB 15.4 09/08/2024   HCT 46.3 09/08/2024   MCV 97.9 09/08/2024   PLT 73 (L) 09/08/2024     STUDIES: No results found.  ASSESSMENT: Thrombocytopenia.  PLAN:    Thrombocytopenia: Patient's platelet count is 73 today.  Upon review of his chart he has had intermittent thrombocytopenia since at least September 2017 although more recently has count has been less than 100.  CT scan of the abdomen and pelvis on March 01, 2023 did not reveal any splenomegaly.  All of his other laboratory work from today including platelet antibody profile, iron panel, B12, folate, and myeloid NGS panel is  pending at time of dictation.  No intervention is needed.  Patient does not require bone marrow biopsy.  Return to clinic in 3 months for repeat laboratory work and further evaluation.  I spent a total of 45 minutes reviewing chart data, face-to-face evaluation with the patient, counseling and coordination of care as detailed above.   Patient expressed understanding and was in agreement with this plan. He also understands that He can call clinic at any time with any questions, concerns, or complaints.     Evalene JINNY Reusing, MD   09/08/2024 3:40 PM

## 2024-09-09 LAB — PLATELET ANTIBODY PROFILE
Glycoprotein IV Antibody: NEGATIVE
HLA Ab Ser Ql EIA: NEGATIVE
IA/IIA Antibody: NEGATIVE
IB/IX Antibody: NEGATIVE
IIB/IIIA Antibody: NEGATIVE

## 2024-09-16 LAB — MYELOID NGS

## 2024-10-31 DEATH — deceased

## 2024-12-06 ENCOUNTER — Inpatient Hospital Stay: Admitting: Oncology

## 2024-12-06 ENCOUNTER — Inpatient Hospital Stay

## 2025-01-12 ENCOUNTER — Ambulatory Visit: Admitting: Dermatology
